# Patient Record
Sex: Female | Born: 1950 | Race: White | Hispanic: No
Health system: Southern US, Community
[De-identification: ages and names within clinical notes are randomized; demographics above are authoritative.]

## PROBLEM LIST (undated history)

## (undated) DIAGNOSIS — N83209 Unspecified ovarian cyst, unspecified side: Secondary | ICD-10-CM

## (undated) DIAGNOSIS — I4891 Unspecified atrial fibrillation: Secondary | ICD-10-CM

## (undated) DIAGNOSIS — F419 Anxiety disorder, unspecified: Secondary | ICD-10-CM

## (undated) DIAGNOSIS — M8580 Other specified disorders of bone density and structure, unspecified site: Secondary | ICD-10-CM

## (undated) DIAGNOSIS — I480 Paroxysmal atrial fibrillation: Secondary | ICD-10-CM

## (undated) DIAGNOSIS — K219 Gastro-esophageal reflux disease without esophagitis: Secondary | ICD-10-CM

## (undated) HISTORY — DX: Paroxysmal atrial fibrillation: I48.0

## (undated) HISTORY — PX: PARTIAL HYSTERECTOMY: SHX80

## (undated) HISTORY — PX: OVARIAN CYST SURGERY: SHX726

## (undated) HISTORY — DX: Unspecified atrial fibrillation: I48.91

---

## 1982-08-08 HISTORY — PX: ABDOMINAL HYSTERECTOMY: SHX81

## 2000-04-25 ENCOUNTER — Other Ambulatory Visit: Admission: RE | Admit: 2000-04-25 | Discharge: 2000-04-25 | Payer: Self-pay | Admitting: Gynecology

## 2003-04-24 ENCOUNTER — Ambulatory Visit (HOSPITAL_COMMUNITY): Admission: RE | Admit: 2003-04-24 | Discharge: 2003-04-24 | Payer: Self-pay | Admitting: Pulmonary Disease

## 2003-05-14 ENCOUNTER — Encounter: Payer: Self-pay | Admitting: Gynecology

## 2003-05-14 ENCOUNTER — Ambulatory Visit (HOSPITAL_COMMUNITY): Admission: RE | Admit: 2003-05-14 | Discharge: 2003-05-14 | Payer: Self-pay | Admitting: Gynecology

## 2003-05-19 ENCOUNTER — Other Ambulatory Visit: Admission: RE | Admit: 2003-05-19 | Discharge: 2003-05-19 | Payer: Self-pay | Admitting: Gynecology

## 2003-05-22 ENCOUNTER — Ambulatory Visit (HOSPITAL_COMMUNITY): Admission: RE | Admit: 2003-05-22 | Discharge: 2003-05-22 | Payer: Self-pay | Admitting: Pulmonary Disease

## 2003-10-13 ENCOUNTER — Ambulatory Visit (HOSPITAL_COMMUNITY): Admission: RE | Admit: 2003-10-13 | Discharge: 2003-10-13 | Payer: Self-pay | Admitting: Gastroenterology

## 2003-11-10 ENCOUNTER — Ambulatory Visit (HOSPITAL_COMMUNITY): Admission: RE | Admit: 2003-11-10 | Discharge: 2003-11-10 | Payer: Self-pay | Admitting: Pulmonary Disease

## 2004-02-20 ENCOUNTER — Ambulatory Visit (HOSPITAL_COMMUNITY): Admission: RE | Admit: 2004-02-20 | Discharge: 2004-02-20 | Payer: Self-pay | Admitting: Pulmonary Disease

## 2004-04-21 ENCOUNTER — Ambulatory Visit (HOSPITAL_COMMUNITY): Admission: RE | Admit: 2004-04-21 | Discharge: 2004-04-21 | Payer: Self-pay | Admitting: Pulmonary Disease

## 2004-05-14 ENCOUNTER — Ambulatory Visit (HOSPITAL_COMMUNITY): Admission: RE | Admit: 2004-05-14 | Discharge: 2004-05-14 | Payer: Self-pay | Admitting: Gynecology

## 2005-03-24 ENCOUNTER — Emergency Department (HOSPITAL_COMMUNITY): Admission: EM | Admit: 2005-03-24 | Discharge: 2005-03-24 | Payer: Self-pay | Admitting: Emergency Medicine

## 2005-05-16 ENCOUNTER — Ambulatory Visit (HOSPITAL_COMMUNITY): Admission: RE | Admit: 2005-05-16 | Discharge: 2005-05-16 | Payer: Self-pay | Admitting: Gynecology

## 2005-05-25 ENCOUNTER — Ambulatory Visit (HOSPITAL_COMMUNITY): Admission: RE | Admit: 2005-05-25 | Discharge: 2005-05-25 | Payer: Self-pay | Admitting: Gynecology

## 2005-12-21 ENCOUNTER — Ambulatory Visit: Payer: Self-pay | Admitting: Orthopedic Surgery

## 2006-02-10 ENCOUNTER — Ambulatory Visit (HOSPITAL_COMMUNITY): Admission: RE | Admit: 2006-02-10 | Discharge: 2006-02-10 | Payer: Self-pay | Admitting: Pulmonary Disease

## 2006-02-12 ENCOUNTER — Emergency Department (HOSPITAL_COMMUNITY): Admission: EM | Admit: 2006-02-12 | Discharge: 2006-02-12 | Payer: Self-pay | Admitting: Emergency Medicine

## 2006-03-21 ENCOUNTER — Emergency Department (HOSPITAL_COMMUNITY): Admission: EM | Admit: 2006-03-21 | Discharge: 2006-03-21 | Payer: Self-pay | Admitting: Emergency Medicine

## 2006-03-26 ENCOUNTER — Emergency Department (HOSPITAL_COMMUNITY): Admission: EM | Admit: 2006-03-26 | Discharge: 2006-03-26 | Payer: Self-pay | Admitting: Emergency Medicine

## 2006-05-12 ENCOUNTER — Encounter (HOSPITAL_COMMUNITY): Admission: RE | Admit: 2006-05-12 | Discharge: 2006-06-11 | Payer: Self-pay | Admitting: Occupational Medicine

## 2006-05-26 ENCOUNTER — Ambulatory Visit (HOSPITAL_COMMUNITY): Admission: RE | Admit: 2006-05-26 | Discharge: 2006-05-26 | Payer: Self-pay | Admitting: Gynecology

## 2006-06-23 ENCOUNTER — Encounter: Admission: RE | Admit: 2006-06-23 | Discharge: 2006-06-23 | Payer: Self-pay | Admitting: Occupational Medicine

## 2006-07-05 ENCOUNTER — Encounter (HOSPITAL_COMMUNITY): Admission: RE | Admit: 2006-07-05 | Discharge: 2006-08-04 | Payer: Self-pay | Admitting: Occupational Medicine

## 2006-08-24 ENCOUNTER — Ambulatory Visit (HOSPITAL_COMMUNITY): Admission: RE | Admit: 2006-08-24 | Discharge: 2006-08-24 | Payer: Self-pay | Admitting: Pulmonary Disease

## 2006-10-17 ENCOUNTER — Ambulatory Visit (HOSPITAL_COMMUNITY)
Admission: RE | Admit: 2006-10-17 | Discharge: 2006-10-17 | Payer: Self-pay | Admitting: Physical Medicine and Rehabilitation

## 2006-10-17 ENCOUNTER — Encounter: Payer: Self-pay | Admitting: Orthopedic Surgery

## 2006-11-07 ENCOUNTER — Ambulatory Visit (HOSPITAL_COMMUNITY): Admission: RE | Admit: 2006-11-07 | Discharge: 2006-11-07 | Payer: Self-pay | Admitting: Gynecology

## 2006-11-08 ENCOUNTER — Ambulatory Visit (HOSPITAL_COMMUNITY): Admission: RE | Admit: 2006-11-08 | Discharge: 2006-11-08 | Payer: Self-pay | Admitting: Gynecology

## 2007-01-31 ENCOUNTER — Ambulatory Visit (HOSPITAL_COMMUNITY)
Admission: RE | Admit: 2007-01-31 | Discharge: 2007-01-31 | Payer: Self-pay | Admitting: Physical Medicine and Rehabilitation

## 2007-03-25 ENCOUNTER — Emergency Department (HOSPITAL_COMMUNITY): Admission: EM | Admit: 2007-03-25 | Discharge: 2007-03-25 | Payer: Self-pay | Admitting: Emergency Medicine

## 2007-04-13 ENCOUNTER — Ambulatory Visit (HOSPITAL_COMMUNITY): Admission: RE | Admit: 2007-04-13 | Discharge: 2007-04-13 | Payer: Self-pay | Admitting: Gastroenterology

## 2007-04-26 ENCOUNTER — Emergency Department (HOSPITAL_COMMUNITY): Admission: EM | Admit: 2007-04-26 | Discharge: 2007-04-26 | Payer: Self-pay | Admitting: Emergency Medicine

## 2007-11-17 ENCOUNTER — Ambulatory Visit: Payer: Self-pay | Admitting: Cardiology

## 2008-03-11 ENCOUNTER — Ambulatory Visit (HOSPITAL_COMMUNITY): Admission: RE | Admit: 2008-03-11 | Discharge: 2008-03-11 | Payer: Self-pay | Admitting: Gynecology

## 2008-03-26 ENCOUNTER — Encounter (HOSPITAL_COMMUNITY)
Admission: RE | Admit: 2008-03-26 | Discharge: 2008-04-15 | Payer: Self-pay | Admitting: Physical Medicine and Rehabilitation

## 2008-12-15 ENCOUNTER — Ambulatory Visit (HOSPITAL_COMMUNITY): Admission: RE | Admit: 2008-12-15 | Discharge: 2008-12-15 | Payer: Self-pay | Admitting: Gastroenterology

## 2009-06-11 ENCOUNTER — Ambulatory Visit (HOSPITAL_COMMUNITY): Admission: RE | Admit: 2009-06-11 | Discharge: 2009-06-11 | Payer: Self-pay | Admitting: Gynecology

## 2009-06-12 ENCOUNTER — Ambulatory Visit (HOSPITAL_COMMUNITY): Admission: RE | Admit: 2009-06-12 | Discharge: 2009-06-12 | Payer: Self-pay | Admitting: Gynecology

## 2009-09-10 ENCOUNTER — Encounter: Payer: Self-pay | Admitting: Orthopedic Surgery

## 2009-09-14 ENCOUNTER — Ambulatory Visit: Payer: Self-pay | Admitting: Orthopedic Surgery

## 2009-09-14 DIAGNOSIS — M25569 Pain in unspecified knee: Secondary | ICD-10-CM | POA: Insufficient documentation

## 2009-09-14 DIAGNOSIS — M1710 Unilateral primary osteoarthritis, unspecified knee: Secondary | ICD-10-CM

## 2010-06-10 ENCOUNTER — Encounter (HOSPITAL_COMMUNITY)
Admission: RE | Admit: 2010-06-10 | Discharge: 2010-07-10 | Payer: Self-pay | Source: Home / Self Care | Admitting: Orthopedic Surgery

## 2010-06-14 ENCOUNTER — Ambulatory Visit (HOSPITAL_COMMUNITY): Admission: RE | Admit: 2010-06-14 | Discharge: 2010-06-14 | Payer: Self-pay | Admitting: Gynecology

## 2010-08-28 ENCOUNTER — Encounter: Payer: Self-pay | Admitting: Gynecology

## 2010-08-29 ENCOUNTER — Encounter: Payer: Self-pay | Admitting: Pulmonary Disease

## 2010-09-07 NOTE — Assessment & Plan Note (Signed)
Summary: rt knee pain needs xr/umr/bsf   Vital Signs:  Patient profile:   60 year old female Pulse rate:   70 / minute Resp:     18 per minute  Vitals Entered By: Fuller Canada MD (September 14, 2009 1:31 PM)  Visit Type:  Initial Consult Referring Provider:  self Primary Provider:  Dr. Juanetta Gosling  CC:  right knee pain.  History of Present Illness: I saw Tara Brennan in the office today for an initial visit.  She is a 60 years old woman with the complaint of:  right knee pain.  The patient reports no injury but reports pain in her RIGHT knee which is sharp stabbing and severe which is intermittent worse when standing located medially and anteriorly does not radiate came on suddenly.  Denies catching or locking.  Meds: Calcium 600 plus D, Aspirin, Generic Fosamax.    Allergies (verified): No Known Drug Allergies  Past History:  Past Medical History: osteoporosis  Past Surgical History: cyst removed from ovary hysterectomy  Family History: FH of Cancer:  Family History of Diabetes Family History of Arthritis  Social History: Patient is married.  short stay registration no smoking, drinking or caffeine use  Review of Systems General:  Denies weight loss, weight gain, fever, chills, and fatigue. Cardiac :  Denies chest pain, angina, heart attack, heart failure, poor circulation, blood clots, and phlebitis. Resp:  Denies short of breath, difficulty breathing, COPD, cough, and pneumonia. GI:  Denies nausea, vomiting, diarrhea, constipation, difficulty swallowing, ulcers, GERD, and reflux. GU:  Denies kidney failure, kidney transplant, kidney stones, burning, poor stream, testicular cancer, blood in urine, and . Neuro:  Denies headache, dizziness, migraines, numbness, weakness, tremor, and unsteady walking. MS:  Denies joint pain, rheumatoid arthritis, joint swelling, gout, bone cancer, osteoporosis, and . Endo:  Denies thyroid disease, goiter, and diabetes. Psych:   Denies depression, mood swings, anxiety, panic attack, bipolar, and schizophrenia. Derm:  Denies eczema, cancer, and itching. EENT:  Denies poor vision, cataracts, glaucoma, poor hearing, vertigo, ears ringing, sinusitis, hoarseness, toothaches, and bleeding gums. Immunology:  Denies seasonal allergies, sinus problems, and allergic to bee stings. Lymphatic:  Denies lymph node cancer and lymph edema.  Physical Exam  Additional Exam:  cancer vital signs are stable as recorded  General appearance was normal  Cardiovascular exam revealed normal pulses and temperature no edema no tenderness moderate amount of varicosities in the tibial areas and dorsal areas of the foot  Lymphatic system groin nodes were negative  Gait and station were normal  On inspection alignment was normal in each knee there was no joint effusion there was no joint tenderness except on the lateral inferior facet of the patella and we noted compression testing revealed crepitance but no pain with range of motion which was full in both knees stability normal both knees muscle strength and tone normal both knees  Hip range of motion normal both hips  Lower extremities RIGHT and LEFT skin normal  Deep tendon reflexes equal 2+  Sensation normal  She was oriented to time person and place  Mood and affect were normal     Impression & Recommendations:  Problem # 1:  KNEE, ARTHRITIS, DEGEN./OSTEO (ICD-715.96) Assessment New  x-rays 3 views RIGHT knee, we see mild to moderate joint space narrowing which is symmetric.  There are no marginal osteophytes.  Bone quality looks normal.  Impression mild osteoarthritis RIGHT knee  Orders: New Patient Level III (14782) Knee x-ray,  3 views (95621)  Problem # 2:  KNEE PAIN (ZOX-096.04) Assessment: New  early arthritis with some patellofemoral symptoms as well.  Recommend exercise program and patient education arthritis from the [employer] if the symptoms become more  constant than repeat evaluation  Orders: New Patient Level III (54098) Knee x-ray,  3 views (11914)  Patient Instructions: 1)  You have arthritis of your knee 2)  We recommend exercises to help with your knee cap arthritis 3)  Give [place] a call if the pain gets worse or more frequent

## 2010-09-07 NOTE — Letter (Signed)
Summary: Historic Patient File  Historic Patient File   Imported By: Elvera Maria 09/29/2009 11:23:00  _____________________________________________________________________  External Attachment:    Type:   Image     Comment:   history form

## 2010-12-21 ENCOUNTER — Ambulatory Visit (HOSPITAL_COMMUNITY)
Admission: RE | Admit: 2010-12-21 | Discharge: 2010-12-21 | Disposition: A | Discharge status: Home / Self Care | Payer: 59 | Source: Ambulatory Visit | Attending: Orthopedic Surgery | Admitting: Orthopedic Surgery

## 2010-12-21 DIAGNOSIS — M6281 Muscle weakness (generalized): Secondary | ICD-10-CM | POA: Insufficient documentation

## 2010-12-21 DIAGNOSIS — M25519 Pain in unspecified shoulder: Secondary | ICD-10-CM | POA: Insufficient documentation

## 2010-12-21 DIAGNOSIS — M25619 Stiffness of unspecified shoulder, not elsewhere classified: Secondary | ICD-10-CM | POA: Insufficient documentation

## 2010-12-21 DIAGNOSIS — Z5189 Encounter for other specified aftercare: Secondary | ICD-10-CM | POA: Insufficient documentation

## 2010-12-21 NOTE — Op Note (Signed)
NAMEEILIS, CHESTNUTT             ACCOUNT NO.:  [id number]   MEDICAL RECORD NO.:  [id number]          PATIENT TYPE:  AMB   LOCATION:  ENDO                         FACILITY[facility]  [facility]   PHYSICIAN:  Petra Kuba, M.D.    DATE OF BIRTH:  February 07, 1951   DATE OF PROCEDURE:  12/15/2008  DATE OF DISCHARGE:                               OPERATIVE REPORT   PROCEDURE:  Colonoscopy.   INDICATIONS:  Patient with family history of colon polyps due for repeat  screening.  Consent was signed after risks, benefits, methods, options  thoroughly discussed multiple times in the past.   MEDICINES USED:  Fentanyl 125 mcg, Versed 8 mg.   PROCEDURE:  Rectal inspection is pertinent for small external  hemorrhoids.  Digital exam was negative.  Video pediatric colonoscope  was inserted.  We easily advanced around the colon to the cecum.  This  did require abdominal pressure but no position changes.  On insertion  some sigmoid diverticula were seen but no other abnormalities.  Cecum  was identified by the appendiceal orifice and ileocecal valve.  In fact,  the scope was inserted short ways in the terminal ileum which was  normal.  Photodocumentation was obtained.  The scope was slowly  withdrawn.  The prep was adequate.  There is some liquid stool that  required washing and suctioning and on slow withdrawal through the  colon, the cecum and ascending were normal.  In the transverse rare  diverticula were seen.  The descending was mostly normal and the sigmoid  did have some small diverticula in it, no polyps, tumors or masses or  other abnormalities were seen as we slowly withdrew back to the rectum.  Once back in the rectum, anorectal pull-through and retroflexion  revealed some hemorrhoids.  Scope was straightened and readvanced short  ways up the left side of the colon.  Air was suctioned, scope removed.  The patient tolerated the procedure well.  There was no obvious  immediate complication.   ENDOSCOPIC  DIAGNOSES:  1. Internal-external tiny hemorrhoids.  2. Rare transverse, primarily sigmoid diverticula, small.  3. Otherwise within normal limits to the terminal ileum.   PLANS:  Happy to see back p.r.n., repeat colon screening in 5 years,  return care to Dr.  Juanetta Gosling for the customary healthcare screening and  maintenance.           ______________________________  Petra Kuba, M.D.     MEM/MEDQ  D:  12/15/2008  T:  12/15/2008  Job:  [id number]   cc:   Ramon Dredge L. Juanetta Gosling, M.D.  Fax: [phone]

## 2010-12-23 ENCOUNTER — Ambulatory Visit (HOSPITAL_COMMUNITY)
Admission: RE | Admit: 2010-12-23 | Discharge: 2010-12-23 | Disposition: A | Discharge status: Home / Self Care | Payer: 59 | Source: Ambulatory Visit | Attending: Pulmonary Disease | Admitting: Pulmonary Disease

## 2010-12-24 NOTE — Op Note (Signed)
NAME:  Tara Brennan, Tara Brennan                       ACCOUNT NO.:  [id number]   MEDICAL RECORD NO.:  [id number]                   PATIENT TYPE:  AMB   LOCATION:  ENDO                                 FACILITY:  [facility]   PHYSICIAN:  Petra Kuba, M.D.                 DATE OF BIRTH:  Sep 29, 1950   DATE OF PROCEDURE:  10/13/2003  DATE OF DISCHARGE:                                 OPERATIVE REPORT   PROCEDURE:  Colonoscopy.   INDICATIONS:  Family history of colon polyps, due for colonic screening.  Left upper quadrant abdominal pain.  Consent was signed after risks,  benefits, methods, and options thoroughly discussed in the office.   MEDICINES USED:  Demerol 80 mg, Versed 8 mg.   DESCRIPTION OF PROCEDURE:  Rectal inspection was pertinent for small  external hemorrhoids.  Digital exam was negative.  The video pediatric  adjustable colonoscope was inserted and despite a tortuous sigmoid, once  through this area was easily able to advance to the cecum.  This did require  rolling her on her back and various abdominal pressures.  No obvious  abnormality was seen on insertion. The cecum was identified by the  appendiceal orifice and the ileocecal valve.  In fact, the scope was  inserted a short way into the terminal ileum, which was normal.  Photo  documentation was obtained.  The scope was slowly withdrawn.  The prep was  adequate.  There was some liquid stool that required washing and suctioning.  On slow withdrawal through the colon, other than a rare early left-sided  diverticulum, no other abnormalities were seen.  Anorectal pull-through and  retroflexion back in the rectum confirmed some small hemorrhoids.  The scope  was straightened, air was suctioned, the scope removed.  The patient  tolerated the procedure well.  There was no obvious immediate complication.   ENDOSCOPIC DIAGNOSES:  1. Internal-external small hemorrhoids.  2. Rare early left-sided diverticula.  3. Otherwise within normal  limits to the terminal ileum.   PLAN:  Yearly rectals and guaiacs by Dr. Greta Doom.  Happy to see back p.r.n. or  in two to three months to recheck symptoms and make sure no further workup  plans are needed.  Otherwise repeat screening in five years.                                               Petra Kuba, M.D.    MEM/MEDQ  D:  10/13/2003  T:  10/13/2003  Job:  [id number]   cc:   Sheppard Plumber. Earlene Plater, M.D.  [address]  [place]  [place] [address]  Fax: [phone]   Luvenia Redden, M.D.  [address]  [place]  [place] [address]  Fax: [phone]

## 2010-12-28 ENCOUNTER — Ambulatory Visit (HOSPITAL_COMMUNITY)
Admission: RE | Admit: 2010-12-28 | Discharge: 2010-12-28 | Disposition: A | Discharge status: Home / Self Care | Payer: 59 | Source: Ambulatory Visit | Attending: Pulmonary Disease | Admitting: Pulmonary Disease

## 2010-12-30 ENCOUNTER — Ambulatory Visit (HOSPITAL_COMMUNITY): Payer: 59 | Admitting: Specialist

## 2011-01-04 ENCOUNTER — Ambulatory Visit (HOSPITAL_COMMUNITY)
Admission: RE | Admit: 2011-01-04 | Discharge: 2011-01-04 | Disposition: A | Discharge status: Home / Self Care | Payer: 59 | Source: Ambulatory Visit | Attending: Pulmonary Disease | Admitting: Pulmonary Disease

## 2011-01-06 ENCOUNTER — Ambulatory Visit (HOSPITAL_COMMUNITY): Payer: 59 | Admitting: Specialist

## 2011-01-11 ENCOUNTER — Ambulatory Visit (HOSPITAL_COMMUNITY): Payer: 59 | Admitting: Specialist

## 2011-01-13 ENCOUNTER — Ambulatory Visit (HOSPITAL_COMMUNITY): Payer: 59 | Admitting: Specialist

## 2011-02-17 ENCOUNTER — Other Ambulatory Visit (HOSPITAL_COMMUNITY): Payer: Self-pay | Admitting: Pulmonary Disease

## 2011-02-17 DIAGNOSIS — R1011 Right upper quadrant pain: Secondary | ICD-10-CM

## 2011-02-18 ENCOUNTER — Ambulatory Visit (HOSPITAL_COMMUNITY)
Admission: RE | Admit: 2011-02-18 | Discharge: 2011-02-18 | Disposition: A | Discharge status: Home / Self Care | Payer: 59 | Source: Ambulatory Visit | Attending: Pulmonary Disease | Admitting: Pulmonary Disease

## 2011-02-18 DIAGNOSIS — K7689 Other specified diseases of liver: Secondary | ICD-10-CM | POA: Insufficient documentation

## 2011-02-18 DIAGNOSIS — R1011 Right upper quadrant pain: Secondary | ICD-10-CM | POA: Insufficient documentation

## 2011-03-02 ENCOUNTER — Ambulatory Visit (HOSPITAL_COMMUNITY)
Admission: RE | Admit: 2011-03-02 | Discharge: 2011-03-02 | Disposition: A | Discharge status: Home / Self Care | Payer: 59 | Source: Ambulatory Visit | Attending: Pulmonary Disease | Admitting: Pulmonary Disease

## 2011-03-02 ENCOUNTER — Other Ambulatory Visit (HOSPITAL_COMMUNITY): Payer: Self-pay | Admitting: Pulmonary Disease

## 2011-03-02 DIAGNOSIS — R0789 Other chest pain: Secondary | ICD-10-CM

## 2011-03-02 DIAGNOSIS — R079 Chest pain, unspecified: Secondary | ICD-10-CM | POA: Insufficient documentation

## 2011-05-04 ENCOUNTER — Other Ambulatory Visit (HOSPITAL_COMMUNITY): Payer: Self-pay | Admitting: Gynecology

## 2011-05-04 DIAGNOSIS — Z139 Encounter for screening, unspecified: Secondary | ICD-10-CM

## 2011-05-06 LAB — BUN: BUN: 20

## 2011-05-20 LAB — POCT RAPID STREP A: Streptococcus, Group A Screen (Direct): NEGATIVE

## 2011-06-17 ENCOUNTER — Ambulatory Visit (HOSPITAL_COMMUNITY)
Admission: RE | Admit: 2011-06-17 | Discharge: 2011-06-17 | Disposition: A | Discharge status: Home / Self Care | Payer: 59 | Source: Ambulatory Visit | Attending: Gynecology | Admitting: Gynecology

## 2011-06-17 DIAGNOSIS — Z1231 Encounter for screening mammogram for malignant neoplasm of breast: Secondary | ICD-10-CM | POA: Insufficient documentation

## 2011-06-17 DIAGNOSIS — Z139 Encounter for screening, unspecified: Secondary | ICD-10-CM

## 2011-06-29 ENCOUNTER — Emergency Department (HOSPITAL_COMMUNITY): Payer: PRIVATE HEALTH INSURANCE

## 2011-06-29 ENCOUNTER — Emergency Department (HOSPITAL_COMMUNITY)
Admission: EM | Admit: 2011-06-29 | Discharge: 2011-06-29 | Disposition: A | Discharge status: Home / Self Care | Payer: PRIVATE HEALTH INSURANCE | Attending: Emergency Medicine | Admitting: Emergency Medicine

## 2011-06-29 ENCOUNTER — Encounter: Payer: Self-pay | Admitting: Emergency Medicine

## 2011-06-29 DIAGNOSIS — W19XXXA Unspecified fall, initial encounter: Secondary | ICD-10-CM

## 2011-06-29 DIAGNOSIS — Z9079 Acquired absence of other genital organ(s): Secondary | ICD-10-CM | POA: Insufficient documentation

## 2011-06-29 DIAGNOSIS — Y9289 Other specified places as the place of occurrence of the external cause: Secondary | ICD-10-CM | POA: Insufficient documentation

## 2011-06-29 DIAGNOSIS — S60229A Contusion of unspecified hand, initial encounter: Secondary | ICD-10-CM | POA: Insufficient documentation

## 2011-06-29 DIAGNOSIS — W010XXA Fall on same level from slipping, tripping and stumbling without subsequent striking against object, initial encounter: Secondary | ICD-10-CM | POA: Insufficient documentation

## 2011-06-29 DIAGNOSIS — S20219A Contusion of unspecified front wall of thorax, initial encounter: Secondary | ICD-10-CM | POA: Insufficient documentation

## 2011-06-29 DIAGNOSIS — M25539 Pain in unspecified wrist: Secondary | ICD-10-CM | POA: Insufficient documentation

## 2011-06-29 DIAGNOSIS — R109 Unspecified abdominal pain: Secondary | ICD-10-CM | POA: Insufficient documentation

## 2011-06-29 DIAGNOSIS — S60221A Contusion of right hand, initial encounter: Secondary | ICD-10-CM

## 2011-06-29 HISTORY — DX: Unspecified ovarian cyst, unspecified side: N83.209

## 2011-06-29 HISTORY — DX: Other specified disorders of bone density and structure, unspecified site: M85.80

## 2011-06-29 NOTE — ED Notes (Signed)
Per patient fell on sidewalk this morning and hit right side on pavement. Patient reports hitting head but denies LOC, dizziness, or blurred vision. Alert and oriented. Patient c/o right side/rib pain, right wrist pain, right knee pain, and "slight headache.

## 2011-06-29 NOTE — ED Provider Notes (Signed)
Scribed for Donnetta Hutching, MD, the patient was seen in room APA01/APA01. This chart was scribed by [employer]. The patient's care started at 09:03  CSN: [id number] Arrival date & time: 06/29/2011  8:43 AM   First MD Initiated Contact with Patient 06/29/11 [phone]      Chief Complaint  Patient presents with  . Fall  . Wrist Pain  . Headache  . Flank Pain  . Knee Pain   HPI Tara Brennan is a 60 y.o. female who presents to the Emergency Department complaining of Fall. She states that she was on her way into work when she tripped on the curb and fell, hitting her head, right wrist and flank. She complains of right wrist, right flank, right knee pain and headache. Patient ambulates with normal gait. She denies any problems chewing.   Past Medical History  Diagnosis Date  . Osteopenia   . Ovarian cyst     Past Surgical History  Procedure Date  . Abdominal hysterectomy 1984  . Partial hysterectomy     Family History  Problem Relation Age of Onset  . Hypertension Mother   . Cancer Father   . Diabetes Sister     History  Substance Use Topics  . Smoking status: Never Smoker   . Smokeless tobacco: Never Used  . Alcohol Use: No    OB History    Grav Para Term Preterm Abortions TAB SAB Ect Mult Living   3 2  2 1  1   2       Review of Systems  Unable to perform ROS Constitutional: Negative for fatigue.  HENT: Negative for congestion, sinus pressure and ear discharge.   Eyes: Negative for discharge.  Respiratory: Negative for cough.   Cardiovascular: Negative for chest pain.  Gastrointestinal: Negative for abdominal pain and diarrhea.  Genitourinary: Positive for flank pain (right flank). Negative for frequency and hematuria.  Musculoskeletal: Negative for back pain.       Right wrist pain  Skin: Negative for rash.  Neurological: Negative for seizures and headaches.  Hematological: Negative.   Psychiatric/Behavioral: Negative for hallucinations.  All other  systems reviewed and are negative.    Allergies  Review of patient's allergies indicates no known allergies.  Home Medications  No current outpatient prescriptions on file.  BP 139/82  Pulse 68  Temp(Src) 98 F (36.7 C) (Oral)  Resp 16  Ht 5\' 8"  (1.727 m)  Wt 170 lb (77.111 kg)  BMI 25.85 kg/m2  SpO2 99%  Physical Exam  Nursing note and vitals reviewed. Constitutional: She is oriented to person, place, and time. She appears well-developed and well-nourished. No distress.  HENT:  Head: Normocephalic and atraumatic.  Right Ear: External ear normal.  Left Ear: External ear normal.  Mouth/Throat: Oropharynx is clear and moist.       Minimal tenderness on the right temporal area  Eyes: Conjunctivae are normal. Right eye exhibits no discharge. Left eye exhibits no discharge. No scleral icterus.  Neck: Neck supple. No tracheal deviation present.  Cardiovascular: Normal rate, regular rhythm and intact distal pulses.   Pulmonary/Chest: Effort normal. No stridor. No respiratory distress. She exhibits tenderness (Minimal tenderness on the right lateral chest wall).  Abdominal: Soft. Bowel sounds are normal. She exhibits no distension. There is no tenderness. There is no rebound and no guarding.  Musculoskeletal: She exhibits tenderness (Tender on the right thenar eminence and some tenderness around the right wrist. Minimally on the right anterior knee). She exhibits no edema.  Neurological: She is alert and oriented to person, place, and time. She has normal strength. No cranial nerve deficit ( no gross defecits noted) or sensory deficit. She exhibits normal muscle tone. She displays no seizure activity. Coordination normal.       No pronator drift bilateral upper extrem, able to hold both legs off bed for 5 seconds, sensation intact in all extremities, no visual field cuts, no left or right sided neglect  Skin: Skin is warm and dry. No rash noted.  Psychiatric: She has a normal mood and  affect. Her behavior is normal.    ED Course  Procedures  DIAGNOSTIC STUDIES: Oxygen Saturation is 99% on room air, normal by my interpretation.    COORDINATION OF CARE: 09:25 - EDP examined patient at bedside. X-ray of right hand and right ribs ordered.   Dg Ribs Unilateral Right  06/29/2011  *RADIOLOGY REPORT*  Clinical Data: Fall.  RIGHT RIBS - 2 VIEW  Comparison: None.  Findings: Marker was placed along the lateral right lower chest. No evidence for a displaced right rib fracture.  Right lung is clear without a large pneumothorax.  IMPRESSION: No evidence for a right rib fracture.  Original Report Authenticated By: Richarda Overlie, M.D.   Dg Hand Complete Right  06/29/2011  *RADIOLOGY REPORT*  Clinical Data: Fall and pain.  RIGHT HAND - COMPLETE 3+ VIEW  Comparison: None.  Findings: Three views of the right hand were obtained.  No evidence for acute fracture or dislocation.   Alignment of the hand is within normal limits.  IMPRESSION: No acute bony abnormality to the right hand.  Original Report Authenticated By: Richarda Overlie, M.D.     MDM.... status post fall.  No neuro deficits.  X-rays negative    Donnetta Hutching, MD 06/29/11 1133

## 2011-06-29 NOTE — ED Notes (Signed)
Pt reports tripped over curb in parking lot.  C/O pain to R hand and R rib area.  Denies SOB.  Radial pulse present, hand warm to touch, capillary refill WNL, and able to move fingers.  Ice pack applied.

## 2011-09-22 ENCOUNTER — Ambulatory Visit (HOSPITAL_COMMUNITY)
Admission: RE | Admit: 2011-09-22 | Discharge: 2011-09-22 | Disposition: A | Discharge status: Home / Self Care | Payer: 59 | Source: Ambulatory Visit | Attending: Pulmonary Disease | Admitting: Pulmonary Disease

## 2011-09-22 ENCOUNTER — Other Ambulatory Visit (HOSPITAL_COMMUNITY): Payer: Self-pay | Admitting: Pulmonary Disease

## 2011-09-22 DIAGNOSIS — R079 Chest pain, unspecified: Secondary | ICD-10-CM | POA: Insufficient documentation

## 2011-12-07 ENCOUNTER — Encounter: Payer: Self-pay | Admitting: Orthopedic Surgery

## 2011-12-07 ENCOUNTER — Ambulatory Visit (INDEPENDENT_AMBULATORY_CARE_PROVIDER_SITE_OTHER): Payer: 59

## 2011-12-07 ENCOUNTER — Ambulatory Visit (INDEPENDENT_AMBULATORY_CARE_PROVIDER_SITE_OTHER): Payer: 59 | Admitting: Orthopedic Surgery

## 2011-12-07 VITALS — BP 126/70 | Ht 68.0 in | Wt 182.0 lb

## 2011-12-07 DIAGNOSIS — M1710 Unilateral primary osteoarthritis, unspecified knee: Secondary | ICD-10-CM

## 2011-12-07 DIAGNOSIS — M25569 Pain in unspecified knee: Secondary | ICD-10-CM

## 2011-12-07 DIAGNOSIS — M179 Osteoarthritis of knee, unspecified: Secondary | ICD-10-CM | POA: Insufficient documentation

## 2011-12-07 NOTE — Progress Notes (Signed)
  Subjective:    Tara Brennan is a 61 y.o. female who presents with knee pain involving both knees. Onset was sudden, related to no specific injury. Mechanism of injury: none. Inciting event: none known. Current symptoms include: crepitus sensation, stiffness and swelling. Pain is aggravated by going up and down stairs and rising after sitting. Patient has had prior knee problems. Evaluation to date: none. Treatment to date: PT which was somewhat effective.  The following portions of the patient's history were reviewed and updated as appropriate: allergies, current medications, past family history, past medical history, past social history, past surgical history and problem list.   Review of Systems A comprehensive review of systems was negative except for: weight gain, border of the eyes.   Objective:    BP 126/70  Ht 5\' 8"  (1.727 m)  Wt 182 lb (82.555 kg)  BMI 27.67 kg/m2  Vital signs are stable as recorded  General appearance is normal  The patient is alert and oriented x3  The patient's mood and affect are normal  Gait assessment: normal ambulation The cardiovascular exam reveals normal pulses and temperature without edema swelling.  The lymphatic system is negative for palpable lymph nodes  The sensory exam is normal.  There are no pathologic reflexes.  Balance is normal.   Exam of the LEFT and RIGHT knee Inspection no effusion is detected in either knee both knees have full range of motion. There is crepitance in both patellofemoral joints. Both knees are stable. Muscle tone is normal in both knees. Skin is normal in both legs. Patellofemoral inhibition is mild in both knees. LEFT worse than RIGHT.  X-ray left knee: mild degenerative changes, symmetric without spurring    Assessment:    Bilateral Mild osteoarthritis bilaterally Mild patellofemoral syndrome bilaterally   Plan:    Natural history and expected course discussed. Questions answered. OTC  analgesics as needed. Arthrocentesis. See procedure note. Follow up in 6 months.   Knee  Injection Procedure Note  Pre-operative Diagnosis: left knee oa  Post-operative Diagnosis: same  Indications: pain  Anesthesia: ethyl chloride   Procedure Details   Verbal consent was obtained for the procedure. Time out was completed.The joint was prepped with alcohol, followed by  Ethyl chloride spray and A 20 gauge needle was inserted into the knee via lateral approach; 4ml 1% lidocaine and 1 ml of depomedrol  was then injected into the joint . The needle was removed and the area cleansed and dressed.  Complications:  None; patient tolerated the procedure well.

## 2011-12-07 NOTE — Patient Instructions (Signed)
You have received a steroid shot. 15% of patients experience increased pain at the injection site with in the next 24 hours. This is best treated with ice and tylenol extra strength 2 tabs every 8 hours. If you are still having pain please call the office.   Start Over the counter OsteoBiflex available at [employer] good weight and exercise

## 2012-01-07 ENCOUNTER — Ambulatory Visit (HOSPITAL_COMMUNITY)
Admission: RE | Admit: 2012-01-07 | Discharge: 2012-01-07 | Disposition: A | Discharge status: Home / Self Care | Payer: 59 | Source: Ambulatory Visit | Attending: Pulmonary Disease | Admitting: Pulmonary Disease

## 2012-01-07 ENCOUNTER — Other Ambulatory Visit (HOSPITAL_COMMUNITY): Payer: Self-pay | Admitting: Pulmonary Disease

## 2012-01-07 DIAGNOSIS — S99919A Unspecified injury of unspecified ankle, initial encounter: Secondary | ICD-10-CM

## 2012-01-07 DIAGNOSIS — S8990XA Unspecified injury of unspecified lower leg, initial encounter: Secondary | ICD-10-CM | POA: Insufficient documentation

## 2012-01-07 DIAGNOSIS — M25579 Pain in unspecified ankle and joints of unspecified foot: Secondary | ICD-10-CM | POA: Insufficient documentation

## 2012-01-07 DIAGNOSIS — X500XXA Overexertion from strenuous movement or load, initial encounter: Secondary | ICD-10-CM | POA: Insufficient documentation

## 2012-04-30 ENCOUNTER — Other Ambulatory Visit (HOSPITAL_BASED_OUTPATIENT_CLINIC_OR_DEPARTMENT_OTHER): Payer: Self-pay | Admitting: Gynecology

## 2012-04-30 ENCOUNTER — Other Ambulatory Visit (HOSPITAL_COMMUNITY): Payer: Self-pay | Admitting: Gynecology

## 2012-04-30 DIAGNOSIS — Z139 Encounter for screening, unspecified: Secondary | ICD-10-CM

## 2012-05-17 ENCOUNTER — Ambulatory Visit (HOSPITAL_COMMUNITY): Payer: Self-pay

## 2012-06-13 ENCOUNTER — Ambulatory Visit (INDEPENDENT_AMBULATORY_CARE_PROVIDER_SITE_OTHER): Payer: 59 | Admitting: Orthopedic Surgery

## 2012-06-13 ENCOUNTER — Encounter: Payer: Self-pay | Admitting: Orthopedic Surgery

## 2012-06-13 VITALS — Ht 68.0 in | Wt 182.0 lb

## 2012-06-13 DIAGNOSIS — M1710 Unilateral primary osteoarthritis, unspecified knee: Secondary | ICD-10-CM

## 2012-06-13 NOTE — Patient Instructions (Addendum)
Activity as tolerated

## 2012-06-13 NOTE — Progress Notes (Signed)
Patient ID: Tara Brennan, female   DOB: 13-Mar-1951, 61 y.o.   MRN: [id number] Chief Complaint  Patient presents with  . Follow-up    6 month recheck on knees.    Status post injection in one of the 2 knees still has a sharp pain in the RIGHT knee. LEFT knee doing well after injection.  Full range of motion, smooth range of motion. No pain no tenderness no swelling.  Pain and osteoarthritis, bilateral knee, LEFT greater than RIGHT.  Follow up as needed

## 2012-06-18 ENCOUNTER — Ambulatory Visit (HOSPITAL_COMMUNITY)
Admission: RE | Admit: 2012-06-18 | Discharge: 2012-06-18 | Disposition: A | Discharge status: Home / Self Care | Payer: 59 | Source: Ambulatory Visit | Attending: Gynecology | Admitting: Gynecology

## 2012-06-18 DIAGNOSIS — Z139 Encounter for screening, unspecified: Secondary | ICD-10-CM

## 2012-06-18 DIAGNOSIS — Z1231 Encounter for screening mammogram for malignant neoplasm of breast: Secondary | ICD-10-CM | POA: Insufficient documentation

## 2012-06-25 ENCOUNTER — Other Ambulatory Visit (HOSPITAL_COMMUNITY): Payer: Self-pay | Admitting: Gynecology

## 2012-06-25 DIAGNOSIS — M810 Age-related osteoporosis without current pathological fracture: Secondary | ICD-10-CM

## 2012-06-28 ENCOUNTER — Encounter (HOSPITAL_COMMUNITY)
Admission: RE | Disposition: A | Discharge status: Home / Self Care | Payer: Self-pay | Source: Ambulatory Visit | Attending: Gastroenterology

## 2012-06-28 ENCOUNTER — Encounter (HOSPITAL_COMMUNITY): Payer: Self-pay

## 2012-06-28 ENCOUNTER — Ambulatory Visit (HOSPITAL_COMMUNITY)
Admission: RE | Admit: 2012-06-28 | Discharge: 2012-06-28 | Disposition: A | Discharge status: Home / Self Care | Payer: 59 | Source: Ambulatory Visit | Attending: Gastroenterology | Admitting: Gastroenterology

## 2012-06-28 DIAGNOSIS — K2100 Gastro-esophageal reflux disease with esophagitis, without bleeding: Secondary | ICD-10-CM | POA: Insufficient documentation

## 2012-06-28 DIAGNOSIS — K449 Diaphragmatic hernia without obstruction or gangrene: Secondary | ICD-10-CM | POA: Insufficient documentation

## 2012-06-28 HISTORY — DX: Gastro-esophageal reflux disease without esophagitis: K21.9

## 2012-06-28 HISTORY — PX: ESOPHAGOGASTRODUODENOSCOPY: SHX5428

## 2012-06-28 SURGERY — EGD (ESOPHAGOGASTRODUODENOSCOPY)
Anesthesia: Moderate Sedation

## 2012-06-28 MED ORDER — SODIUM CHLORIDE 0.9 % IV SOLN: INTRAVENOUS | Status: DC

## 2012-06-28 MED ORDER — FENTANYL CITRATE 0.05 MG/ML IJ SOLN
INTRAMUSCULAR | Status: AC
  Filled 2012-06-28: qty 2

## 2012-06-28 MED ORDER — MIDAZOLAM HCL 10 MG/2ML IJ SOLN
INTRAMUSCULAR | Status: DC | PRN
  Administered 2012-06-28 (×3): 2 mg via INTRAVENOUS
  Administered 2012-06-28: 1 mg via INTRAVENOUS
  Administered 2012-06-28: 2 mg via INTRAVENOUS

## 2012-06-28 MED ORDER — BUTAMBEN-TETRACAINE-BENZOCAINE 2-2-14 % EX AERO
INHALATION_SPRAY | CUTANEOUS | Status: DC | PRN
  Administered 2012-06-28: 2 via TOPICAL

## 2012-06-28 MED ORDER — DIPHENHYDRAMINE HCL 50 MG/ML IJ SOLN
INTRAMUSCULAR | Status: DC | PRN
  Administered 2012-06-28: 12.5 mg via INTRAVENOUS

## 2012-06-28 MED ORDER — FENTANYL CITRATE 0.05 MG/ML IJ SOLN
INTRAMUSCULAR | Status: DC | PRN
  Administered 2012-06-28 (×3): 25 ug via INTRAVENOUS

## 2012-06-28 MED ORDER — DIPHENHYDRAMINE HCL 50 MG/ML IJ SOLN
INTRAMUSCULAR | Status: AC
  Filled 2012-06-28: qty 1

## 2012-06-28 MED ORDER — MIDAZOLAM HCL 5 MG/ML IJ SOLN
INTRAMUSCULAR | Status: AC
  Filled 2012-06-28: qty 2

## 2012-06-28 NOTE — Op Note (Signed)
[facility] [address] [place], [address]   ENDOSCOPY PROCEDURE REPORT  PATIENT: [name], [name]  MR#: [id number] BIRTHDATE: 1950-08-11 , 61  yrs. old GENDER: Female  ENDOSCOPIST: Vida Rigger, MD REFERRED XB:JYNWGN Juanetta Gosling, M.D.  PROCEDURE DATE:  06/28/2012 PROCEDURE:   EGD, diagnostic ASA CLASS:   Class II INDICATIONS:Chest pain and Therapy of reflux esophagitis.  MEDICATIONS: Benadryl 12.5 mg IV, Fentanyl 75 mcg IV, and Versed 9 mg IV  TOPICAL ANESTHETIC:  DESCRIPTION OF PROCEDURE:   After the risks benefits and alternatives of the procedure were thoroughly explained, informed consent was obtained.  The Pentax Gastroscope [id number]  endoscope was introduced through the mouth and advanced to the third or even fourth portion of the duodenum , limited by Without limitations. The instrument was slowly withdrawn as the mucosa was fully examined.other than a moderate sized hiatal hernia no other abnormalities were seen and the findings are recorded below and the patient tolerated the procedure well and there was no obvious immediate complication         FINDINGS: 1. Moderate hiatal hernia 2. Otherwise within normal limits EGD to probably the fourth part of the duodenum  COMPLICATIONS:none  ENDOSCOPIC IMPRESSION: above   RECOMMENDATIONS:continue pump inhibitor call me when necessary followup in 2-3 months even consider surgical options  in the futureREPEAT EXAM:as needed   _______________________________ Vida Rigger, MD eSigned:  Vida Rigger, MD 06/28/2012 9:21 AM    CC:Ed Juanetta Gosling, MD  PATIENT NAME:  Tara Brennan, Tara Brennan MR#: [id number]

## 2012-06-28 NOTE — Progress Notes (Signed)
Tara Brennan 8:34 AM  Subjective: Patient is improved since reseller in the office on the medicine with no new complaints or problem although she still has some discomfort after eating she does not have any dysphagia  Objective: Vital signs stable afebrile no acute distress please see preassessment evaluation  Assessment: Upper tract symptoms  Plan: Okay to proceed with endoscopy doubt she will need dilation but we discussed that with her and her husband as well  Kylea Berrong E

## 2012-06-29 ENCOUNTER — Encounter (HOSPITAL_COMMUNITY): Payer: Self-pay | Admitting: Gastroenterology

## 2012-06-29 ENCOUNTER — Encounter (HOSPITAL_COMMUNITY): Payer: Self-pay

## 2012-07-10 ENCOUNTER — Ambulatory Visit (HOSPITAL_COMMUNITY)
Admission: RE | Admit: 2012-07-10 | Discharge: 2012-07-10 | Disposition: A | Discharge status: Home / Self Care | Payer: 59 | Source: Ambulatory Visit | Attending: Gynecology | Admitting: Gynecology

## 2012-07-10 DIAGNOSIS — M810 Age-related osteoporosis without current pathological fracture: Secondary | ICD-10-CM | POA: Insufficient documentation

## 2013-04-04 ENCOUNTER — Ambulatory Visit (INDEPENDENT_AMBULATORY_CARE_PROVIDER_SITE_OTHER): Payer: 59 | Admitting: Orthopedic Surgery

## 2013-04-04 ENCOUNTER — Encounter: Payer: Self-pay | Admitting: Orthopedic Surgery

## 2013-04-04 ENCOUNTER — Ambulatory Visit (INDEPENDENT_AMBULATORY_CARE_PROVIDER_SITE_OTHER): Payer: 59

## 2013-04-04 DIAGNOSIS — M79609 Pain in unspecified limb: Secondary | ICD-10-CM

## 2013-04-04 DIAGNOSIS — M79676 Pain in unspecified toe(s): Secondary | ICD-10-CM | POA: Insufficient documentation

## 2013-04-04 NOTE — Progress Notes (Signed)
Patient ID: Tara Brennan, female   DOB: [birth year], 62 y.o.   MRN: [id number]  Chief Complaint  Patient presents with  . Toe Pain    2nd toe right foot injured 03/22/13    HISTORY: This is a 61 year old female who stubbed her right foot and injured the right second toe with change in alignment. This is about August 15 she's take the toe because of the deformity she complains of intermittent throbbing 3/10 pain swelling at the PIP joint and she had some ecchymosis at the time review of systems negative she has had a hysterectomy no medical problems she is married   BP 148/87  Ht 5' 7.5" (1.715 m)  Wt 173 lb (78.472 kg)  BMI 26.68 kg/m2 General appearance is normal, the patient is alert and oriented x3 with normal mood and affect. When she stands the toe has separation between the great toe and second toe with angulation towards the fibula and small toe tenderness at the PIP joint with swelling painful range of motion at the PIP and DIP joint compared to the other foot there is slight angulation and scans intact has good capillary refill and normal dorsalis pedis pulse we did order an x-ray the x-ray does not show definitive fracture there is some abnormality at the PIP joint near the middle phalanx but no definitive fracture  Impression toe injury cannot rule out fracture recommend buddy taping followup as needed

## 2013-04-04 NOTE — Patient Instructions (Signed)
Tape 1 and 2

## 2013-06-03 ENCOUNTER — Other Ambulatory Visit (HOSPITAL_COMMUNITY): Payer: Self-pay | Admitting: Gynecology

## 2013-06-03 DIAGNOSIS — Z139 Encounter for screening, unspecified: Secondary | ICD-10-CM

## 2013-06-21 ENCOUNTER — Ambulatory Visit (HOSPITAL_COMMUNITY)
Admission: RE | Admit: 2013-06-21 | Discharge: 2013-06-21 | Disposition: A | Discharge status: Home / Self Care | Payer: 59 | Source: Ambulatory Visit | Attending: Gynecology | Admitting: Gynecology

## 2013-06-21 DIAGNOSIS — Z139 Encounter for screening, unspecified: Secondary | ICD-10-CM

## 2013-06-21 DIAGNOSIS — Z1231 Encounter for screening mammogram for malignant neoplasm of breast: Secondary | ICD-10-CM | POA: Insufficient documentation

## 2014-06-09 ENCOUNTER — Encounter: Payer: Self-pay | Admitting: Orthopedic Surgery

## 2014-06-17 ENCOUNTER — Other Ambulatory Visit (HOSPITAL_COMMUNITY): Payer: Self-pay | Admitting: Gynecology

## 2014-06-17 DIAGNOSIS — Z1231 Encounter for screening mammogram for malignant neoplasm of breast: Secondary | ICD-10-CM

## 2014-06-27 ENCOUNTER — Ambulatory Visit (HOSPITAL_COMMUNITY)
Admission: RE | Admit: 2014-06-27 | Discharge: 2014-06-27 | Disposition: A | Discharge status: Home / Self Care | Payer: BC Managed Care – PPO | Source: Ambulatory Visit | Attending: Gynecology | Admitting: Gynecology

## 2014-06-27 DIAGNOSIS — Z1231 Encounter for screening mammogram for malignant neoplasm of breast: Secondary | ICD-10-CM | POA: Insufficient documentation

## 2014-10-09 ENCOUNTER — Ambulatory Visit (HOSPITAL_COMMUNITY)
Admission: RE | Admit: 2014-10-09 | Discharge: 2014-10-09 | Disposition: A | Discharge status: Home / Self Care | Payer: 59 | Source: Ambulatory Visit | Attending: Pulmonary Disease | Admitting: Pulmonary Disease

## 2014-10-09 ENCOUNTER — Other Ambulatory Visit (HOSPITAL_COMMUNITY): Payer: Self-pay | Admitting: Pulmonary Disease

## 2014-10-09 DIAGNOSIS — M542 Cervicalgia: Secondary | ICD-10-CM | POA: Diagnosis present

## 2014-10-09 DIAGNOSIS — M5032 Other cervical disc degeneration, mid-cervical region: Secondary | ICD-10-CM | POA: Diagnosis not present

## 2015-01-09 ENCOUNTER — Other Ambulatory Visit (HOSPITAL_COMMUNITY): Payer: Self-pay | Admitting: Pulmonary Disease

## 2015-01-09 ENCOUNTER — Ambulatory Visit (HOSPITAL_COMMUNITY)
Admission: RE | Admit: 2015-01-09 | Discharge: 2015-01-09 | Disposition: A | Discharge status: Home / Self Care | Payer: 59 | Source: Ambulatory Visit | Attending: Pulmonary Disease | Admitting: Pulmonary Disease

## 2015-01-09 DIAGNOSIS — R933 Abnormal findings on diagnostic imaging of other parts of digestive tract: Secondary | ICD-10-CM | POA: Diagnosis not present

## 2015-01-09 DIAGNOSIS — R1030 Lower abdominal pain, unspecified: Secondary | ICD-10-CM | POA: Insufficient documentation

## 2015-01-09 DIAGNOSIS — R110 Nausea: Secondary | ICD-10-CM | POA: Insufficient documentation

## 2015-01-09 LAB — POCT I-STAT CREATININE: CREATININE: 0.8 mg/dL (ref 0.44–1.00)

## 2015-01-09 MED ORDER — IOHEXOL 300 MG/ML  SOLN
100.0000 mL | Freq: Once | INTRAMUSCULAR | Status: AC | PRN
  Administered 2015-01-09: 100 mL via INTRAVENOUS

## 2015-03-16 ENCOUNTER — Ambulatory Visit (HOSPITAL_COMMUNITY)
Admission: RE | Admit: 2015-03-16 | Discharge: 2015-03-16 | Disposition: A | Discharge status: Home / Self Care | Payer: 59 | Source: Ambulatory Visit | Attending: Pulmonary Disease | Admitting: Pulmonary Disease

## 2015-03-16 ENCOUNTER — Other Ambulatory Visit (HOSPITAL_COMMUNITY): Payer: Self-pay | Admitting: Pulmonary Disease

## 2015-03-16 DIAGNOSIS — R0602 Shortness of breath: Secondary | ICD-10-CM | POA: Diagnosis not present

## 2015-03-16 DIAGNOSIS — R918 Other nonspecific abnormal finding of lung field: Secondary | ICD-10-CM | POA: Insufficient documentation

## 2015-06-30 ENCOUNTER — Other Ambulatory Visit (HOSPITAL_COMMUNITY): Payer: Self-pay | Admitting: Pulmonary Disease

## 2015-06-30 DIAGNOSIS — Z1231 Encounter for screening mammogram for malignant neoplasm of breast: Secondary | ICD-10-CM

## 2015-07-08 ENCOUNTER — Other Ambulatory Visit (HOSPITAL_COMMUNITY): Payer: Self-pay | Admitting: Pulmonary Disease

## 2015-07-08 ENCOUNTER — Ambulatory Visit (HOSPITAL_COMMUNITY)
Admission: RE | Admit: 2015-07-08 | Discharge: 2015-07-08 | Disposition: A | Discharge status: Home / Self Care | Payer: 59 | Source: Ambulatory Visit | Attending: Pulmonary Disease | Admitting: Pulmonary Disease

## 2015-07-08 DIAGNOSIS — Z1231 Encounter for screening mammogram for malignant neoplasm of breast: Secondary | ICD-10-CM | POA: Insufficient documentation

## 2015-07-08 DIAGNOSIS — Z79899 Other long term (current) drug therapy: Secondary | ICD-10-CM

## 2015-07-08 DIAGNOSIS — Z780 Asymptomatic menopausal state: Secondary | ICD-10-CM

## 2015-07-14 ENCOUNTER — Other Ambulatory Visit (HOSPITAL_COMMUNITY): Payer: Self-pay

## 2015-07-20 ENCOUNTER — Other Ambulatory Visit (HOSPITAL_COMMUNITY): Payer: Self-pay

## 2015-07-24 ENCOUNTER — Ambulatory Visit (HOSPITAL_COMMUNITY)
Admission: RE | Admit: 2015-07-24 | Discharge: 2015-07-24 | Disposition: A | Discharge status: Home / Self Care | Payer: 59 | Source: Ambulatory Visit | Attending: Pulmonary Disease | Admitting: Pulmonary Disease

## 2015-07-24 DIAGNOSIS — Z780 Asymptomatic menopausal state: Secondary | ICD-10-CM | POA: Insufficient documentation

## 2015-07-24 DIAGNOSIS — M810 Age-related osteoporosis without current pathological fracture: Secondary | ICD-10-CM | POA: Diagnosis not present

## 2015-07-24 DIAGNOSIS — Z90721 Acquired absence of ovaries, unilateral: Secondary | ICD-10-CM | POA: Insufficient documentation

## 2015-07-24 DIAGNOSIS — Z79899 Other long term (current) drug therapy: Secondary | ICD-10-CM | POA: Insufficient documentation

## 2015-07-24 DIAGNOSIS — Z90710 Acquired absence of both cervix and uterus: Secondary | ICD-10-CM | POA: Diagnosis not present

## 2015-10-05 DIAGNOSIS — M810 Age-related osteoporosis without current pathological fracture: Secondary | ICD-10-CM | POA: Insufficient documentation

## 2015-12-30 ENCOUNTER — Ambulatory Visit (HOSPITAL_COMMUNITY)
Admission: RE | Admit: 2015-12-30 | Discharge: 2015-12-30 | Disposition: A | Discharge status: Home / Self Care | Payer: BLUE CROSS/BLUE SHIELD | Source: Ambulatory Visit | Attending: Pulmonary Disease | Admitting: Pulmonary Disease

## 2015-12-30 ENCOUNTER — Other Ambulatory Visit (HOSPITAL_COMMUNITY): Payer: Self-pay | Admitting: Pulmonary Disease

## 2015-12-30 DIAGNOSIS — M25551 Pain in right hip: Secondary | ICD-10-CM

## 2016-07-08 ENCOUNTER — Other Ambulatory Visit (HOSPITAL_COMMUNITY): Payer: Self-pay | Admitting: Obstetrics and Gynecology

## 2016-07-08 DIAGNOSIS — Z1231 Encounter for screening mammogram for malignant neoplasm of breast: Secondary | ICD-10-CM

## 2016-07-14 ENCOUNTER — Ambulatory Visit (HOSPITAL_COMMUNITY)
Admission: RE | Admit: 2016-07-14 | Discharge: 2016-07-14 | Disposition: A | Discharge status: Home / Self Care | Payer: BLUE CROSS/BLUE SHIELD | Source: Ambulatory Visit | Attending: Obstetrics and Gynecology | Admitting: Obstetrics and Gynecology

## 2016-07-14 DIAGNOSIS — Z1231 Encounter for screening mammogram for malignant neoplasm of breast: Secondary | ICD-10-CM | POA: Diagnosis not present

## 2016-08-04 ENCOUNTER — Ambulatory Visit (HOSPITAL_COMMUNITY)
Admission: RE | Admit: 2016-08-04 | Discharge: 2016-08-04 | Disposition: A | Discharge status: Home / Self Care | Payer: BLUE CROSS/BLUE SHIELD | Source: Ambulatory Visit | Attending: Pulmonary Disease | Admitting: Pulmonary Disease

## 2016-08-04 ENCOUNTER — Other Ambulatory Visit (HOSPITAL_COMMUNITY): Payer: Self-pay | Admitting: Pulmonary Disease

## 2016-08-04 DIAGNOSIS — Z9181 History of falling: Secondary | ICD-10-CM | POA: Diagnosis not present

## 2016-08-04 DIAGNOSIS — W19XXXD Unspecified fall, subsequent encounter: Secondary | ICD-10-CM

## 2017-02-24 DIAGNOSIS — K219 Gastro-esophageal reflux disease without esophagitis: Secondary | ICD-10-CM | POA: Diagnosis not present

## 2017-02-24 DIAGNOSIS — M1990 Unspecified osteoarthritis, unspecified site: Secondary | ICD-10-CM | POA: Diagnosis not present

## 2017-02-24 DIAGNOSIS — M818 Other osteoporosis without current pathological fracture: Secondary | ICD-10-CM | POA: Diagnosis not present

## 2017-02-24 DIAGNOSIS — I10 Essential (primary) hypertension: Secondary | ICD-10-CM | POA: Diagnosis not present

## 2017-02-27 DIAGNOSIS — H2513 Age-related nuclear cataract, bilateral: Secondary | ICD-10-CM | POA: Diagnosis not present

## 2017-05-10 DIAGNOSIS — M7542 Impingement syndrome of left shoulder: Secondary | ICD-10-CM | POA: Diagnosis not present

## 2017-07-19 ENCOUNTER — Other Ambulatory Visit (HOSPITAL_COMMUNITY): Payer: Self-pay | Admitting: Obstetrics & Gynecology

## 2017-07-19 DIAGNOSIS — Z1231 Encounter for screening mammogram for malignant neoplasm of breast: Secondary | ICD-10-CM

## 2017-07-20 ENCOUNTER — Ambulatory Visit (HOSPITAL_COMMUNITY): Payer: Self-pay

## 2017-07-24 ENCOUNTER — Ambulatory Visit (HOSPITAL_COMMUNITY)
Admission: RE | Admit: 2017-07-24 | Discharge: 2017-07-24 | Disposition: A | Discharge status: Home / Self Care | Payer: PPO | Source: Ambulatory Visit | Attending: Obstetrics & Gynecology | Admitting: Obstetrics & Gynecology

## 2017-07-24 ENCOUNTER — Encounter (HOSPITAL_COMMUNITY): Payer: Self-pay

## 2017-07-24 DIAGNOSIS — Z1231 Encounter for screening mammogram for malignant neoplasm of breast: Secondary | ICD-10-CM | POA: Diagnosis not present

## 2017-07-25 DIAGNOSIS — Z01419 Encounter for gynecological examination (general) (routine) without abnormal findings: Secondary | ICD-10-CM | POA: Diagnosis not present

## 2017-07-25 DIAGNOSIS — Z124 Encounter for screening for malignant neoplasm of cervix: Secondary | ICD-10-CM | POA: Diagnosis not present

## 2017-07-25 DIAGNOSIS — Z6827 Body mass index (BMI) 27.0-27.9, adult: Secondary | ICD-10-CM | POA: Diagnosis not present

## 2017-08-17 DIAGNOSIS — I10 Essential (primary) hypertension: Secondary | ICD-10-CM | POA: Diagnosis not present

## 2017-08-17 DIAGNOSIS — H00019 Hordeolum externum unspecified eye, unspecified eyelid: Secondary | ICD-10-CM | POA: Diagnosis not present

## 2017-08-17 DIAGNOSIS — K219 Gastro-esophageal reflux disease without esophagitis: Secondary | ICD-10-CM | POA: Diagnosis not present

## 2017-08-28 DIAGNOSIS — M1990 Unspecified osteoarthritis, unspecified site: Secondary | ICD-10-CM | POA: Diagnosis not present

## 2017-08-28 DIAGNOSIS — I10 Essential (primary) hypertension: Secondary | ICD-10-CM | POA: Diagnosis not present

## 2017-08-28 DIAGNOSIS — K210 Gastro-esophageal reflux disease with esophagitis: Secondary | ICD-10-CM | POA: Diagnosis not present

## 2017-09-01 ENCOUNTER — Ambulatory Visit (INDEPENDENT_AMBULATORY_CARE_PROVIDER_SITE_OTHER): Payer: 59 | Admitting: Orthopedic Surgery

## 2017-09-01 ENCOUNTER — Ambulatory Visit (INDEPENDENT_AMBULATORY_CARE_PROVIDER_SITE_OTHER): Payer: 59

## 2017-09-01 ENCOUNTER — Encounter (INDEPENDENT_AMBULATORY_CARE_PROVIDER_SITE_OTHER): Payer: Self-pay | Admitting: Orthopedic Surgery

## 2017-09-01 DIAGNOSIS — M5410 Radiculopathy, site unspecified: Secondary | ICD-10-CM

## 2017-09-01 DIAGNOSIS — M4856XA Collapsed vertebra, not elsewhere classified, lumbar region, initial encounter for fracture: Secondary | ICD-10-CM | POA: Diagnosis not present

## 2017-09-01 NOTE — Progress Notes (Signed)
Office Visit Note   Patient: Tara Brennan           Date of Birth: 1950/08/22           MRN: [id number] Visit Date: 09/01/2017 Requested by: Sinda Du, [place], [place] [address] PCP: Sinda Du, MD  Subjective: Chief Complaint  Patient presents with  . Left Leg - Pain    HPI: Tara Brennan is a patient with left knee and leg pain.  States that the pain comes and goes.  She reports a pulling on the lateral side of her leg especially going up stairs.  This will wake her from sleep at night.  He has occasional back pain.  She does report one episode of low back pain year ago which was severe but only lasted one day when she was picking up her grandchild.  Denies any bowel or bladder symptoms or other systemic symptoms of illness.              ROS: All systems reviewed are negative as they relate to the chief complaint within the history of present illness.  Patient denies  fevers or chills.   Assessment & Plan: Visit Diagnoses:  1. Radicular leg pain   2. Non-traumatic compression fracture of first lumbar vertebra, initial encounter [facility])     Plan: Impression is left knee and leg pain with new L1 compression fracture.  She does not give a great history of having what I would expect to be 3 or 4 weeks of severe pain associated with this level of a compression fracture.  Concerned this may represent a pathologic fracture.  No definite history of severe osteoporosis.  Plan at this time is MRI L-spine to evaluate this L1 compression fracture.  Her left knee and leg symptoms may be related to iliotibial band bursitis.  We will try her with over-the-counter nonsteroidals daily for [redacted] weeks along with physical therapy for ITB stretching.  The her back after her scan.  Follow-Up Instructions: Return for after MRI.   Orders:  Orders Placed This Encounter  Procedures  . XR Lumbar Spine 2-3 Views  . XR KNEE 3 VIEW LEFT  . MR Lumbar Spine w/o contrast    No orders of the defined types were placed in this encounter.     Procedures: No procedures performed   Clinical Data: No additional findings.  Objective: Vital Signs: There were no vitals taken for this visit.  Physical Exam:   Constitutional: Patient appears well-developed HEENT:  Head: Normocephalic Eyes:EOM are normal Neck: Normal range of motion Cardiovascular: Normal rate Pulmonary/chest: Effort normal Neurologic: Patient is alert Skin: Skin is warm Psychiatric: Patient has normal mood and affect    Ortho Exam: Orthopedic exam demonstrates full active and passive range of motion of the left and right knee.  No effusion on either knee.  Slight iliotibial band tenderness over the lateral epicondyle on the left compared to the right.  Collateral and cruciate ligaments are stable in the left knee.  No groin pain with internal/external rotation of the leg.  No definite paresthesias L1 S1 bilaterally with symmetric reflexes in the patella and biceps and Achilles as well as no muscle atrophy.  She has good ankle dorsiflexion plantar flexion quad and hamstring strength.  Specialty Comments:  No specialty comments available.  Imaging: Xr Knee 3 View Left  Result Date: 09/01/2017 [employer].  No fracture or dislocation is present.  Joint space is maintained in the medial  lateral and patellofemoral compartments.  Alignment normal.  Normal left knee  Xr Lumbar Spine 2-3 Views  Result Date: 09/01/2017 AP lateral lumbar spine reviewed.  Patient has compression fracture over 50% collapse of the L1 vertebral body.  Focal kyphosis present at that level.  Degenerative disc disease but arthritis is present.  There is no scoliosis present.    PMFS History: Patient Active Problem List   Diagnosis Date Noted  . Pain in toe 04/04/2013  . 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 OA (osteoarthritis) of knee 12/07/2011  . Knee pain 12/07/2011  . KNEE, ARTHRITIS, DEGEN./OSTEO  09/14/2009  . KNEE PAIN 09/14/2009   Past Medical History:  Diagnosis Date  . GERD (gastroesophageal reflux disease)   . Osteopenia   . Ovarian cyst     Family History  Problem Relation Age of Onset  . Hypertension Mother   . Cancer Father   . Diabetes Sister   . Arthritis Unknown     Past Surgical History:  Procedure Laterality Date  . ABDOMINAL HYSTERECTOMY  1984  . ESOPHAGOGASTRODUODENOSCOPY  06/28/2012   Procedure: ESOPHAGOGASTRODUODENOSCOPY (EGD);  Surgeon: Jeryl Columbia, MD;  Location: [facility] ENDOSCOPY;  Service: Endoscopy;  Laterality: N/A;  . OVARIAN CYST SURGERY    . PARTIAL HYSTERECTOMY     Social History   Occupational History  . Not on file  Tobacco Use  . Smoking status: Never Smoker  . Smokeless tobacco: Never Used  Substance and Sexual Activity  . Alcohol use: No  . Drug use: No  . Sexual activity: Yes    Birth control/protection: Surgical

## 2017-09-04 ENCOUNTER — Ambulatory Visit (HOSPITAL_COMMUNITY): Payer: PPO | Attending: Orthopedic Surgery

## 2017-09-04 DIAGNOSIS — M25652 Stiffness of left hip, not elsewhere classified: Secondary | ICD-10-CM

## 2017-09-04 DIAGNOSIS — M25552 Pain in left hip: Secondary | ICD-10-CM | POA: Diagnosis not present

## 2017-09-04 DIAGNOSIS — M62838 Other muscle spasm: Secondary | ICD-10-CM | POA: Diagnosis not present

## 2017-09-04 NOTE — Therapy (Addendum)
PHYSICAL THERAPY DISCHARGE SUMMARY  Visits from Start of Care: 1  Current functional level related to goals / functional outcomes: Evaluation completed only, no FU visits. Ortho asked PT to DC from PT d/t new imaging findings.    Remaining deficits: *see above    Education / Equipment: None, None.   Plan: Patient agrees to discharge.  Patient goals were not met. Patient is being discharged due to the physician's request.  ?????         3:29 PM, 10/02/17 Etta Grandchild, PT, DPT Physical Therapist - [place] [phone] (Office)      ---------------------------------------------------------------------------------------------------------------------------------------------------------------------   [place], [place], [address] Phone: [phone]   Fax:  [phone]  Physical Therapy Evaluation  Patient Details  Name: Tara Brennan MRN: [id number] Date of Birth: 27-Aug-1950 Referring Provider: Meredith Pel, MD    Encounter Date: 09/04/2017  PT End of Session - 09/04/17 1440    Visit Number  1    Number of Visits  12    Date for PT Re-Evaluation  09/25/17    Authorization Time Period  09/04/17-10/16/17    PT Start Time  1300    PT Stop Time  1350    PT Time Calculation (min)  50 min    Activity Tolerance  Patient tolerated treatment well       Past Medical History:  Diagnosis Date  . GERD (gastroesophageal reflux disease)   . Osteopenia   . Ovarian cyst     Past Surgical History:  Procedure Laterality Date  . ABDOMINAL HYSTERECTOMY  1984  . ESOPHAGOGASTRODUODENOSCOPY  06/28/2012   Procedure: ESOPHAGOGASTRODUODENOSCOPY (EGD);  Surgeon: Jeryl Columbia, MD;  Location: [facility] ENDOSCOPY;  Service: Endoscopy;  Laterality: N/A;  . OVARIAN CYST SURGERY    . PARTIAL HYSTERECTOMY      There were no vitals filed for this visit.   Subjective Assessment - 09/04/17  1313    Subjective  Pt presents to PT fo revaluation of Left hip pain. She has had PT for her back pain several years ago. This is her first issue with the left hip.     Pertinent History  Pt reports about threea months ago or more noticing some pain in her left trochanteric region with referral into the left lateral thigh, never below the knee, almost exclusiviely associated with carrying her grandchildren while ascending a4-5 steps or more with a step-over-step pattern. The patient reports this occurs when carrying the children at front, in center, or occasionally on right hip. She can feel it mildly without the grandkids but to a much lesser degree. She also notes some stiffness in both legs after sitting x30 minutes or more.     How long can you sit comfortably?  not limited, but over 30 minutes, demonstrates an increased stiffness that limits AMB quality for a few seconds.     How long can you stand comfortably?  unlimited.     How long can you walk comfortably?  unlimited.     Diagnostic tests  recent xrays fo knees and back MD reports according to patient "knees look good," but that she has a compression fracture in the her back of indeterminate chronicity.      Patient Stated Goals  improve her ability toperform care for kids     Currently in Pain?  No/denies paresthesia in theleft lateral pelvis and hiip posterio hip.  [facility] PT Assessment - 09/04/17 0001      Assessment   Medical Diagnosis  Left hip pain with referral into left hip     Referring Provider  Meredith Pel, MD     Onset Date/Surgical Date  -- ~ 3 months ago     Hand Dominance  Right    Next MD Visit  -- After her MRI on 09/29/17    Prior Therapy  none      Precautions   Precautions  None      Restrictions   Weight Bearing Restrictions  No      Balance Screen   Has the patient fallen in the past 6 months  Yes    How many times?  1 tripped over a rake    Has the patient had a decrease in activity level  because of a fear of falling?   No    Is the patient reluctant to leave their home because of a fear of falling?   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; volunteers at [employer]       Observation/Other [employer] on Therapeutic Outcomes (FOTO)   74 (26% impaired)       Sensation   Light Touch  Appears Intact      Functional Tests   Functional tests  Single leg stance;Floor to Stand      Single Leg Stance   Comments  assess at subsequent visit      Floor to Stand   Comments  assess at subsequent visit      ROM / Strength   AROM / PROM / Strength  Strength;PROM      PROM   PROM Assessment Site  Hip    Right/Left Hip  Left;Right    Right Hip External Rotation   54    Right Hip Internal Rotation   27    Left Hip External Rotation   49    Left Hip Internal Rotation   29      Strength   Strength Assessment Site  Hip;Knee;Ankle    Right/Left Hip  Right;Left    Right Hip Flexion  4+/5 seated, good ROM    Right Hip Extension  5/5 (straight leg, ROM WNL)    Right Hip External Rotation   5/5    Right Hip Internal Rotation  5/5    Right Hip ABduction  4+/5 hoizontal abdct: 5/5    Right Hip ADduction  5/5    Left Hip Flexion  5/5 seated, good ROM    Left Hip Extension  4-/5 (straight leg, ROM WNL)    Left Hip External Rotation  4-/5    Left Hip Internal Rotation  4+/5 reproduces trochanteric pain    Left Hip ABduction  4-/5 hoizontal abdct: 5/5    Left Hip ADduction  5/5    Right/Left Knee  Right;Left    Right Knee Flexion  5/5    Right Knee Extension  5/5    Left Knee Flexion  5/5    Left Knee Extension  5/5    Right/Left Ankle  Right;Left    Right Ankle Dorsiflexion  5/5    Left Ankle Dorsiflexion  5/5      Palpation   Palpation comment  significant spasmin bilat quads, >75% of left, >50% of right.  deep palpation of  deep lateral gluteals (-), ltd  by demin      Transfers   Five time sit to stand comments   10.35sec hands free, dynamic valgus knee knocking each time      Ambulation/Gait   Stairs  No assess at subsequent visit    Gait Comments  assess at subsequent visit             Objective measurements completed on examination: See above findings.      [facility] Adult PT Treatment/Exercise - 09/04/17 0001      Exercises   Exercises  Knee/Hip      Knee/Hip Exercises: Stretches   Quad Stretch  3 reps;30 seconds;Both quadruped to tall kneeing; progress quadruped to childs pose      Knee/Hip Exercises: Supine   Bridges  Both;1 set;10 reps             PT Education - 09/04/17 1439    Education provided  Yes    Education Details  strength deficits, and significant soft tissue restrictions in quads bilat    Person(s) Educated  Patient    Methods  Explanation;Demonstration    Comprehension  Need further instruction       PT Short Term Goals - 09/04/17 1449      PT SHORT TERM GOAL #1   Title  After 3 weeks patient will report decreased pain when performing stairs with grandchild loading.     Time  3    Period  Weeks    Status  New    Target Date  09/25/17      PT SHORT TERM GOAL #2   Title  After 3 weeks patient will report improved tolerance to stair climbing in home to access 2nd floor.     Time  3    Period  Weeks    Status  New    Target Date  09/25/17        PT Long Term Goals - 09/04/17 1451      PT LONG TERM GOAL #1   Title  After 6 weeks pt will demonstrate independence with advanced HEP for DC to continue prgress in strength and mobility for up to 35mp DC.     Time  6    Period  Weeks    Status  New    Target Date  10/16/17      PT LONG TERM GOAL #2   Title  After 6 weeks pt will demonstrate performance of 30sec chair rise test >12x hands free absent dynamic knee valgus.     Time  6    Period  Weeks    Target Date  10/16/17      PT LONG TERM GOAL #3   Title  After 6 weeks pt will  demonstrate ability to perform 16 steps with 40lbs front loaded without pain to improve ability to assist grandchildren up stairs for next 12 months.     Time  6    Period  Weeks    Status  New    Target Date  10/16/17             Plan - 09/04/17 1441    Clinical Impression Statement  TJullianna Gaboris a 65yowhite female who comes to [facility] OP c/o Left sided hip pain with left lateral thigh referral associated with stair climbing and joint stiffness that increases after sittin glonger than 30 minutes. Examination reveals intact sensation, generalized tightness in bilat hip rotators Rt>Lt  with<25% ROM deficits detected, pain with resisted Left hip internal rotation, weakness in bilat hip rotators Lt>Rt, weakness in hip extensors on Left, impaired biomechnics in transfers, and significant bilat spasm in quads. Pt will benefit from skilled PT intervention to address said impairments, to decrease pain, and improve tolerance to leisure and volunteer activty.     History and Personal Factors relevant to plan of care:  Pt reports presence of Lumbar compression fracture with no additional detail; history of osteopenia, 1 fall in 71mtripping over a rake.     Clinical Presentation  Stable    Clinical Presentation due to:  tests and measures.     Clinical Decision Making  Moderate    Rehab Potential  Good    PT Frequency  2x / week    PT Duration  6 weeks    PT Treatment/Interventions  ADLs/Self Care Home Management;Gait training;Passive range of motion;Dry needling;Manual techniques;Cognitive remediation;Moist Heat;Electrical Stimulation;DME Instruction;Therapeutic exercise;Balance training;Patient/family education;Therapeutic activities;Functional mobility training;Stair training    PT Next Visit Plan  Review goals, screen lumbar spine, gait and stairs observation, SLS for time. Expan upon HEP accoring to identified deficits.     PT Home Exercise Plan  Supine bridging, quadruped to child's pose quads  stretch.     Consulted and Agree with Plan of Care  Patient       Patient will benefit from skilled therapeutic intervention in order to improve the following deficits and impairments:  Improper body mechanics, Decreased mobility, Increased muscle spasms, Postural dysfunction, Decreased activity tolerance, Decreased range of motion, Decreased strength, Difficulty walking, Decreased balance  Visit Diagnosis: Pain in left hip - Plan: PT plan of care cert/re-cert  Stiffness of left hip, not elsewhere classified - Plan: PT plan of care cert/re-cert  Other muscle spasm - Plan: PT plan of care cert/re-cert     Problem List Patient Active Problem List   Diagnosis Date Noted  . Pain in toe 04/04/2013  . 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 OA (osteoarthritis) of knee 12/07/2011  . Knee pain 12/07/2011  . KNEE, ARTHRITIS, DEGEN./OSTEO 09/14/2009  . KNEE PAIN 09/14/2009   2:58 PM, 09/04/17 AEtta Grandchild PT, DPT [facility] [phone](office)       BEtta Grandchild1/28/2019, 2:57 PM  [address] [address]Phone: [phone]  Fax:  [phone] Name: TEMINA RIBAUDOMRN: [id number] of Birth: 709/19/1952

## 2017-09-07 ENCOUNTER — Telehealth (HOSPITAL_COMMUNITY): Payer: Self-pay | Admitting: Pulmonary Disease

## 2017-09-07 NOTE — Telephone Encounter (Signed)
09/07/17  has to babysit her g.children on this day

## 2017-09-08 ENCOUNTER — Ambulatory Visit (HOSPITAL_COMMUNITY): Payer: PPO

## 2017-09-08 ENCOUNTER — Telehealth (HOSPITAL_COMMUNITY): Payer: Self-pay

## 2017-09-08 NOTE — Telephone Encounter (Signed)
Patient called to cancel her appt and will see Zenia Resides on her next visit

## 2017-09-11 ENCOUNTER — Encounter (HOSPITAL_COMMUNITY): Payer: Self-pay

## 2017-09-25 ENCOUNTER — Ambulatory Visit
Admission: RE | Admit: 2017-09-25 | Discharge: 2017-09-25 | Disposition: A | Discharge status: Home / Self Care | Payer: PPO | Source: Ambulatory Visit | Attending: Orthopedic Surgery | Admitting: Orthopedic Surgery

## 2017-09-25 DIAGNOSIS — M4856XA Collapsed vertebra, not elsewhere classified, lumbar region, initial encounter for fracture: Secondary | ICD-10-CM

## 2017-09-25 DIAGNOSIS — M48061 Spinal stenosis, lumbar region without neurogenic claudication: Secondary | ICD-10-CM | POA: Diagnosis not present

## 2017-09-25 DIAGNOSIS — M5410 Radiculopathy, site unspecified: Secondary | ICD-10-CM

## 2017-09-29 ENCOUNTER — Encounter (INDEPENDENT_AMBULATORY_CARE_PROVIDER_SITE_OTHER): Payer: Self-pay | Admitting: Orthopedic Surgery

## 2017-09-29 ENCOUNTER — Telehealth (HOSPITAL_COMMUNITY): Payer: Self-pay | Admitting: Pulmonary Disease

## 2017-09-29 ENCOUNTER — Ambulatory Visit (INDEPENDENT_AMBULATORY_CARE_PROVIDER_SITE_OTHER): Payer: PPO | Admitting: Orthopedic Surgery

## 2017-09-29 DIAGNOSIS — M5410 Radiculopathy, site unspecified: Secondary | ICD-10-CM | POA: Diagnosis not present

## 2017-09-29 NOTE — Telephone Encounter (Signed)
09/29/17  pt called to say that her dr told her she could stop therapy

## 2017-10-01 ENCOUNTER — Encounter (INDEPENDENT_AMBULATORY_CARE_PROVIDER_SITE_OTHER): Payer: Self-pay | Admitting: Orthopedic Surgery

## 2017-10-01 NOTE — Progress Notes (Signed)
Office Visit Note   Patient: Tara Brennan           Date of Birth: March 22, 1951           MRN: [id number] Visit Date: 09/29/2017 Requested by: Sinda Du, [place], [place] [address] PCP: Sinda Du, MD  Subjective: Chief Complaint  Patient presents with  . Lower Back - Follow-up    HPI: Tara Brennan is a patient with low back pain.  She had a fall in 2007.  Since I have seen her she has had an MRI scan of her lumbar spine.  This scan shows an old L1 compression fracture.  She also has some mild left lateral recess narrowing at L2-3.  I think this accounts for her left-sided pain which is sporadic.              ROS: All systems reviewed are negative as they relate to the chief complaint within the history of present illness.  Patient denies  fevers or chills.   Assessment & Plan: Visit Diagnoses:  1. Radicular leg pain     Plan: Impression is sporadic left-sided radicular symptoms L2-3 narrowing on the left.  Takes occasional Advil for the symptoms.  Injection would be the next step if her symptoms warrant.  The compression fracture remains unchanged in appearance compared to 2007.  The scans are reviewed with the patient.  Follow-up with me as needed  Follow-Up Instructions: Return if symptoms worsen or fail to improve.   Orders:  No orders of the defined types were placed in this encounter.  No orders of the defined types were placed in this encounter.     Procedures: No procedures performed   Clinical Data: No additional findings.  Objective: Vital Signs: There were no vitals taken for this visit.  Physical Exam:   Constitutional: Patient appears well-developed HEENT:  Head: Normocephalic Eyes:EOM are normal Neck: Normal range of motion Cardiovascular: Normal rate Pulmonary/chest: Effort normal Neurologic: Patient is alert Skin: Skin is warm Psychiatric: Patient has normal mood and affect    Ortho Exam: Orthopedic  exam demonstrates normal body mass index normal gait and alignment.  No nerve root tension signs.  No muscle atrophy in the legs.  Ankle dorsiflexion plantarflexion is intact.  No other masses lymphadenopathy or skin changes noted in that left leg region.  Specialty Comments:  No specialty comments available.  Imaging: No results found.   PMFS History: Patient Active Problem List   Diagnosis Date Noted  . Pain in toe 04/04/2013  . 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 OA (osteoarthritis) of knee 12/07/2011  . Knee pain 12/07/2011  . KNEE, ARTHRITIS, DEGEN./OSTEO 09/14/2009  . KNEE PAIN 09/14/2009   Past Medical History:  Diagnosis Date  . GERD (gastroesophageal reflux disease)   . Osteopenia   . Ovarian cyst     Family History  Problem Relation Age of Onset  . Hypertension Mother   . Cancer Father   . Diabetes Sister   . Arthritis Unknown     Past Surgical History:  Procedure Laterality Date  . ABDOMINAL HYSTERECTOMY  1984  . ESOPHAGOGASTRODUODENOSCOPY  06/28/2012   Procedure: ESOPHAGOGASTRODUODENOSCOPY (EGD);  Surgeon: Jeryl Columbia, MD;  [facility] ENDOSCOPY;  Service: Endoscopy;  Laterality: N/A;  . OVARIAN CYST SURGERY    . PARTIAL HYSTERECTOMY     Social History   Occupational History  . Not on file  Tobacco Use  . Smoking status: Never Smoker  .  Smokeless tobacco: Never Used  Substance and Sexual Activity  . Alcohol use: No  . Drug use: No  . Sexual activity: Yes    Birth control/protection: Surgical

## 2017-10-02 ENCOUNTER — Ambulatory Visit (HOSPITAL_COMMUNITY): Payer: PPO

## 2017-10-09 ENCOUNTER — Encounter (HOSPITAL_COMMUNITY): Payer: Self-pay

## 2017-10-13 ENCOUNTER — Other Ambulatory Visit (HOSPITAL_COMMUNITY): Payer: Self-pay | Admitting: Pulmonary Disease

## 2017-10-13 DIAGNOSIS — R52 Pain, unspecified: Secondary | ICD-10-CM

## 2017-10-14 ENCOUNTER — Ambulatory Visit (HOSPITAL_COMMUNITY)
Admission: RE | Admit: 2017-10-14 | Discharge: 2017-10-14 | Disposition: A | Discharge status: Home / Self Care | Payer: PPO | Source: Ambulatory Visit | Attending: Pulmonary Disease | Admitting: Pulmonary Disease

## 2017-10-14 DIAGNOSIS — J984 Other disorders of lung: Secondary | ICD-10-CM | POA: Diagnosis not present

## 2017-10-14 DIAGNOSIS — R079 Chest pain, unspecified: Secondary | ICD-10-CM | POA: Diagnosis not present

## 2017-10-14 DIAGNOSIS — R52 Pain, unspecified: Secondary | ICD-10-CM

## 2017-10-16 ENCOUNTER — Encounter (HOSPITAL_COMMUNITY): Payer: Self-pay

## 2017-11-01 DIAGNOSIS — R079 Chest pain, unspecified: Secondary | ICD-10-CM | POA: Diagnosis not present

## 2017-11-01 DIAGNOSIS — E785 Hyperlipidemia, unspecified: Secondary | ICD-10-CM | POA: Diagnosis not present

## 2017-11-01 DIAGNOSIS — I10 Essential (primary) hypertension: Secondary | ICD-10-CM | POA: Diagnosis not present

## 2017-11-30 ENCOUNTER — Inpatient Hospital Stay (HOSPITAL_COMMUNITY)
Admission: EM | Admit: 2017-11-30 | Discharge: 2017-12-03 | DRG: 200 | Disposition: A | Discharge status: Home / Self Care | Payer: PPO | Attending: General Surgery | Admitting: General Surgery

## 2017-11-30 ENCOUNTER — Emergency Department (HOSPITAL_COMMUNITY): Payer: PPO

## 2017-11-30 ENCOUNTER — Encounter (HOSPITAL_COMMUNITY): Payer: Self-pay

## 2017-11-30 ENCOUNTER — Other Ambulatory Visit: Payer: Self-pay

## 2017-11-30 DIAGNOSIS — Z4682 Encounter for fitting and adjustment of non-vascular catheter: Secondary | ICD-10-CM | POA: Diagnosis not present

## 2017-11-30 DIAGNOSIS — S92411A Displaced fracture of proximal phalanx of right great toe, initial encounter for closed fracture: Secondary | ICD-10-CM | POA: Diagnosis not present

## 2017-11-30 DIAGNOSIS — S2241XA Multiple fractures of ribs, right side, initial encounter for closed fracture: Secondary | ICD-10-CM | POA: Diagnosis not present

## 2017-11-30 DIAGNOSIS — R51 Headache: Secondary | ICD-10-CM | POA: Diagnosis not present

## 2017-11-30 DIAGNOSIS — J90 Pleural effusion, not elsewhere classified: Secondary | ICD-10-CM | POA: Diagnosis not present

## 2017-11-30 DIAGNOSIS — Z8249 Family history of ischemic heart disease and other diseases of the circulatory system: Secondary | ICD-10-CM

## 2017-11-30 DIAGNOSIS — Z809 Family history of malignant neoplasm, unspecified: Secondary | ICD-10-CM | POA: Diagnosis not present

## 2017-11-30 DIAGNOSIS — R079 Chest pain, unspecified: Secondary | ICD-10-CM | POA: Diagnosis present

## 2017-11-30 DIAGNOSIS — M8580 Other specified disorders of bone density and structure, unspecified site: Secondary | ICD-10-CM | POA: Diagnosis present

## 2017-11-30 DIAGNOSIS — S92414A Nondisplaced fracture of proximal phalanx of right great toe, initial encounter for closed fracture: Secondary | ICD-10-CM | POA: Diagnosis present

## 2017-11-30 DIAGNOSIS — J939 Pneumothorax, unspecified: Secondary | ICD-10-CM | POA: Diagnosis not present

## 2017-11-30 DIAGNOSIS — S0003XA Contusion of scalp, initial encounter: Secondary | ICD-10-CM

## 2017-11-30 DIAGNOSIS — S0990XA Unspecified injury of head, initial encounter: Secondary | ICD-10-CM | POA: Diagnosis not present

## 2017-11-30 DIAGNOSIS — Z7983 Long term (current) use of bisphosphonates: Secondary | ICD-10-CM

## 2017-11-30 DIAGNOSIS — M79674 Pain in right toe(s): Secondary | ICD-10-CM

## 2017-11-30 DIAGNOSIS — J9383 Other pneumothorax: Secondary | ICD-10-CM | POA: Diagnosis not present

## 2017-11-30 DIAGNOSIS — Z833 Family history of diabetes mellitus: Secondary | ICD-10-CM | POA: Diagnosis not present

## 2017-11-30 DIAGNOSIS — I10 Essential (primary) hypertension: Secondary | ICD-10-CM | POA: Diagnosis not present

## 2017-11-30 DIAGNOSIS — W109XXA Fall (on) (from) unspecified stairs and steps, initial encounter: Secondary | ICD-10-CM | POA: Diagnosis not present

## 2017-11-30 DIAGNOSIS — Z7982 Long term (current) use of aspirin: Secondary | ICD-10-CM | POA: Diagnosis not present

## 2017-11-30 DIAGNOSIS — Z90710 Acquired absence of both cervix and uterus: Secondary | ICD-10-CM

## 2017-11-30 DIAGNOSIS — S270XXA Traumatic pneumothorax, initial encounter: Principal | ICD-10-CM | POA: Diagnosis present

## 2017-11-30 DIAGNOSIS — K219 Gastro-esophageal reflux disease without esophagitis: Secondary | ICD-10-CM | POA: Diagnosis not present

## 2017-11-30 LAB — COMPREHENSIVE METABOLIC PANEL
ALBUMIN: 4.7 g/dL (ref 3.5–5.0)
ALK PHOS: 87 U/L (ref 38–126)
ALT: 25 U/L (ref 14–54)
AST: 25 U/L (ref 15–41)
Anion gap: 12 (ref 5–15)
BUN: 21 mg/dL — ABNORMAL HIGH (ref 6–20)
CALCIUM: 10.5 mg/dL — AB (ref 8.9–10.3)
CO2: 28 mmol/L (ref 22–32)
CREATININE: 0.57 mg/dL (ref 0.44–1.00)
Chloride: 100 mmol/L — ABNORMAL LOW (ref 101–111)
GFR calc Af Amer: >60 mL/min (ref >60)
GFR calc non Af Amer: >60 mL/min (ref >60)
Glucose, Bld: 105 mg/dL — ABNORMAL HIGH (ref 65–99)
Potassium: 3.7 mmol/L (ref 3.5–5.1)
SODIUM: 140 mmol/L (ref 135–145)
Total Bilirubin: 0.8 mg/dL (ref 0.3–1.2)
Total Protein: 8.1 g/dL (ref 6.5–8.1)

## 2017-11-30 LAB — CBC WITH DIFFERENTIAL/PLATELET
BASOS PCT: 0 %
Basophils Absolute: 0 10*3/uL (ref 0.0–0.1)
EOS ABS: 0 10*3/uL (ref 0.0–0.7)
Eosinophils Relative: 0 %
HCT: 41.4 % (ref 36.0–46.0)
HEMOGLOBIN: 13.7 g/dL (ref 12.0–15.0)
Lymphocytes Relative: 9 %
Lymphs Abs: 1.3 10*3/uL (ref 0.7–4.0)
MCH: 28 pg (ref 26.0–34.0)
MCHC: 33.1 g/dL (ref 30.0–36.0)
MCV: 84.5 fL (ref 78.0–100.0)
Monocytes Absolute: 0.6 10*3/uL (ref 0.1–1.0)
Monocytes Relative: 5 %
NEUTROS PCT: 86 %
Neutro Abs: 12.2 10*3/uL — ABNORMAL HIGH (ref 1.7–7.7)
Platelets: 298 10*3/uL (ref 150–400)
RBC: 4.9 MIL/uL (ref 3.87–5.11)
RDW: 12.9 % (ref 11.5–15.5)
WBC: 14.2 10*3/uL — ABNORMAL HIGH (ref 4.0–10.5)

## 2017-11-30 LAB — PROTIME-INR
INR: 0.95
Prothrombin Time: 12.6 seconds (ref 11.4–15.2)

## 2017-11-30 LAB — APTT: APTT: 26 s (ref 24–36)

## 2017-11-30 MED ORDER — HYDRALAZINE HCL 20 MG/ML IJ SOLN: 10.0000 mg | INTRAMUSCULAR | Status: DC | PRN

## 2017-11-30 MED ORDER — FENTANYL CITRATE (PF) 100 MCG/2ML IJ SOLN
25.0000 ug | INTRAMUSCULAR | Status: DC | PRN
  Administered 2017-11-30: 25 ug via INTRAVENOUS

## 2017-11-30 MED ORDER — PANTOPRAZOLE SODIUM 40 MG PO TBEC
40.0000 mg | DELAYED_RELEASE_TABLET | Freq: Every day | ORAL | Status: DC
  Administered 2017-12-01 – 2017-12-02 (×2): 40 mg via ORAL
  Filled 2017-11-30 (×4): qty 1

## 2017-11-30 MED ORDER — VITAMIN D 1000 UNITS PO TABS
1000.0000 [IU] | ORAL_TABLET | Freq: Every day | ORAL | Status: DC
  Administered 2017-11-30 – 2017-12-03 (×4): 1000 [IU] via ORAL
  Filled 2017-11-30 (×4): qty 1

## 2017-11-30 MED ORDER — ONDANSETRON HCL 4 MG/2ML IJ SOLN
4.0000 mg | Freq: Four times a day (QID) | INTRAMUSCULAR | Status: DC | PRN
  Administered 2017-11-30: 4 mg via INTRAVENOUS
  Filled 2017-11-30: qty 2

## 2017-11-30 MED ORDER — ACETAMINOPHEN 325 MG PO TABS: 650.0000 mg | ORAL_TABLET | ORAL | Status: DC | PRN

## 2017-11-30 MED ORDER — ALENDRONATE SODIUM 70 MG PO TABS: 70.0000 mg | ORAL_TABLET | ORAL | Status: DC

## 2017-11-30 MED ORDER — FENTANYL CITRATE (PF) 100 MCG/2ML IJ SOLN
50.0000 ug | Freq: Once | INTRAMUSCULAR | Status: AC
  Administered 2017-11-30: 50 ug via INTRAVENOUS
  Filled 2017-11-30: qty 2

## 2017-11-30 MED ORDER — AMLODIPINE BESYLATE 5 MG PO TABS
5.0000 mg | ORAL_TABLET | Freq: Every day | ORAL | Status: DC
  Administered 2017-11-30 – 2017-12-03 (×4): 5 mg via ORAL
  Filled 2017-11-30 (×4): qty 1

## 2017-11-30 MED ORDER — ONDANSETRON 4 MG PO TBDP: 4.0000 mg | ORAL_TABLET | Freq: Four times a day (QID) | ORAL | Status: DC | PRN

## 2017-11-30 MED ORDER — ENOXAPARIN SODIUM 40 MG/0.4ML ~~LOC~~ SOLN
40.0000 mg | SUBCUTANEOUS | Status: DC
  Administered 2017-12-01 – 2017-12-03 (×3): 40 mg via SUBCUTANEOUS
  Filled 2017-11-30 (×3): qty 0.4

## 2017-11-30 MED ORDER — ASPIRIN 81 MG PO CHEW
81.0000 mg | CHEWABLE_TABLET | Freq: Every day | ORAL | Status: DC
Start: 2017-11-30 — End: 2017-12-03
  Administered 2017-11-30 – 2017-12-02 (×3): 81 mg via ORAL
  Filled 2017-11-30 (×3): qty 1

## 2017-11-30 MED ORDER — OXYCODONE HCL 5 MG PO TABS
5.0000 mg | ORAL_TABLET | ORAL | Status: DC | PRN
  Administered 2017-11-30 – 2017-12-01 (×2): 5 mg via ORAL
  Filled 2017-11-30 (×2): qty 1

## 2017-11-30 MED ORDER — ONDANSETRON HCL 4 MG/2ML IJ SOLN: 4.0000 mg | Freq: Four times a day (QID) | INTRAMUSCULAR | Status: DC | PRN

## 2017-11-30 MED ORDER — OXYCODONE HCL 5 MG PO TABS
10.0000 mg | ORAL_TABLET | ORAL | Status: DC | PRN
  Administered 2017-11-30 – 2017-12-01 (×2): 10 mg via ORAL
  Filled 2017-11-30 (×2): qty 2

## 2017-11-30 MED ORDER — KCL IN DEXTROSE-NACL 20-5-0.45 MEQ/L-%-% IV SOLN
INTRAVENOUS | Status: DC
  Administered 2017-11-30: 50 mL/h via INTRAVENOUS
  Administered 2017-12-01: 10:00:00 via INTRAVENOUS
  Filled 2017-11-30 (×2): qty 1000

## 2017-11-30 NOTE — ED Triage Notes (Signed)
Pt reports she missed a step coming down stairs and fell and struck head and left shoulder blade on hard wood floor.  Denies any LOC, vomiting, or visual changes.  Pt has swelling noted to back of head.

## 2017-11-30 NOTE — Procedures (Signed)
Chest Tube Insertion Procedure Note  Indications:  Clinically significant Right pneumothorax  Pre-operative Diagnosis: Right pneumothorax  Post-operative Diagnosis: Right pneumothorax  Surgeon: Georganna Skeans, MD  Assistant: - Melina Modena, [facility]  Procedure Details  Informed consent was obtained for the procedure, including sedation.  Risks of lung perforation, hemorrhage, arrhythmia, and adverse drug reaction were discussed.   After sterile skin prep, using sterile technique, a 14 French tube was placed in the R anterior axillary line above the nipple level using Seldinger technique. It was connected to a pleru-evac and a sterile dressing was applied. She tolerated it well.  Findings: Rush of air and small amount of blood  Estimated Blood Loss:  less than 50 mL         Specimens:  None              Complications:  None; patient tolerated the procedure well.         Disposition: admit to SDU         Condition: stable  Georganna Skeans, MD, MPH, FACS Trauma: [phone] General Surgery: [phone]

## 2017-11-30 NOTE — Progress Notes (Signed)
Orthopedic Tech Progress Note Patient Details:  CEYLIN DREIBELBIS [birth year] [id number]  Ortho Devices Type of Ortho Device: Postop shoe/boot Ortho Device/Splint Location: RLE Ortho Device/Splint Interventions: Ordered, Application   Post Interventions Patient Tolerated: Well Instructions Provided: Care of device   Braulio Bosch 11/30/2017, 7:43 PM

## 2017-11-30 NOTE — ED Notes (Signed)
Pt says she was walking down three wooded steps and missed one step.  C/o pain to back of head (K[place]), right scapula and right great toe; rating pain 6/10.  Denies any dizziness or LOC.  Takes 81 mg of ASA nightly, no other blood thinners.

## 2017-11-30 NOTE — ED Notes (Signed)
C collar removed by Dr. Melina Copa

## 2017-11-30 NOTE — ED Notes (Signed)
PT arrives to [facility]

## 2017-11-30 NOTE — H&P (Signed)
[facility] Admission Note  Tara Brennan [birth year]  [id number].    Requesting MD: Melina Copa, MD Chief Complaint/Reason for Consult: Fall, pneumothorax  HPI:  Tara Brennan is a 67 y/o F with a PMH HTN who presented to [facility] from [facility] after a fall that occurred early this morning. Patient was walking down stairs in her home when she missed a step and fell down roughly 3 stairs, hitting her head and her R side on a wood floor. Denies LOC. Ambulatory after the event. Decided to go to the ED due to a knot on her head. Workup negative for acute intracranial injury but significant for R rib fractures and a moderate R pneumothorax so she was transferred to the [facility] trauma center for further treatment. Currently c/o R posterior chest wall pain that radiates through to her anterior chest wall, worse with movement. Also c/o R great toe pain. Denies neck or abdominal pain. Denies nausea or vomiting. Denies use of blood thinning medications.   ROS: Review of Systems  Constitutional: Negative for chills and fever.  Respiratory: Negative for cough, hemoptysis and shortness of breath.   Cardiovascular: Positive for chest pain.  Gastrointestinal: Negative for abdominal pain, heartburn, nausea and vomiting.  Musculoskeletal: Positive for back pain. Negative for neck pain.  Neurological: Negative.   All other systems reviewed and are negative.   Family History  Problem Relation Age of Onset  . Hypertension Mother   . Cancer Father   . Diabetes Sister   . Arthritis Unknown     Past Medical History:  Diagnosis Date  . GERD (gastroesophageal reflux disease)   . Osteopenia   . Ovarian cyst     Past Surgical History:  Procedure Laterality Date  . ABDOMINAL HYSTERECTOMY  1984  . ESOPHAGOGASTRODUODENOSCOPY  06/28/2012   Procedure: ESOPHAGOGASTRODUODENOSCOPY (EGD);  Surgeon: Jeryl Columbia, MD;  Location: [facility] ENDOSCOPY;  Service: Endoscopy;  Laterality: N/A;  . OVARIAN  CYST SURGERY    . PARTIAL HYSTERECTOMY      Social History:  reports that she has never smoked. She has never used smokeless tobacco. She reports that she does not drink alcohol or use drugs.  Allergies: No Known Allergies   (Not in a hospital admission)  Blood pressure (!) 151/68, pulse 82, temperature 98.4 F (36.9 C), temperature source Oral, resp. rate 15, height _0  (1.702 m), weight 79.4 kg (175 lb), SpO2 98 %. Physical Exam: Physical Exam  Constitutional: She is oriented to person, place, and time. She appears well-developed and well-nourished. No distress.  HENT:  Head: Normocephalic.  Small, tender contusion over r parietal scalp  Eyes: Pupils are equal, round, and reactive to light. EOM are normal. Right eye exhibits no discharge. Left eye exhibits no discharge. No scleral icterus.  Neck: Normal range of motion. Neck supple. No tracheal deviation present. No thyromegaly present.  Cardiovascular: Normal rate, regular rhythm, normal heart sounds and intact distal pulses. Exam reveals no friction rub.  No murmur heard. Pulmonary/Chest: Effort normal. No stridor. No respiratory distress. She has no wheezes. She has no rales. She exhibits tenderness.  Slightly decreased breath sounds right lung apex  Abdominal: Soft. Bowel sounds are normal. She exhibits no distension and no mass. There is no tenderness. There is no guarding.  Musculoskeletal: Normal range of motion. She exhibits tenderness. She exhibits no edema or deformity.  TTP R great toe, some ecchymosis  Neurological: She is alert and oriented to person, place, and time. No cranial  nerve deficit or sensory deficit.  Skin: Skin is warm and dry. No rash noted.  Psychiatric: She has a normal mood and affect. Her behavior is normal. Thought content normal.    Results for orders placed or performed during the hospital encounter of 11/30/17 (from the past 48 hour(s))  CBC with Differential/Platelet     Status: Abnormal    Collection Time: 11/30/17  9:29 AM  Result Value Ref Range   WBC 14.2 (H) 4.0 - 10.5 K/uL   RBC 4.90 3.87 - 5.11 MIL/uL   Hemoglobin 13.7 12.0 - 15.0 g/dL   HCT 41.4 36.0 - 46.0 %   MCV 84.5 78.0 - 100.0 fL   MCH 28.0 26.0 - 34.0 pg   MCHC 33.1 30.0 - 36.0 g/dL   RDW 12.9 11.5 - 15.5 %   Platelets 298 150 - 400 K/uL   Neutrophils Relative % 86 %   Neutro Abs 12.2 (H) 1.7 - 7.7 K/uL   Lymphocytes Relative 9 %   Lymphs Abs 1.3 0.7 - 4.0 K/uL   Monocytes Relative 5 %   Monocytes Absolute 0.6 0.1 - 1.0 K/uL   Eosinophils Relative 0 %   Eosinophils Absolute 0.0 0.0 - 0.7 K/uL   Basophils Relative 0 %   Basophils Absolute 0.0 0.0 - 0.1 K/uL    Comment: Performed at [facility], [address] [place], [place] [address]  Comprehensive metabolic panel     Status: Abnormal   Collection Time: 11/30/17  9:29 AM  Result Value Ref Range   Sodium 140 135 - 145 mmol/L   Potassium 3.7 3.5 - 5.1 mmol/L   Chloride 100 (L) 101 - 111 mmol/L   CO2 28 22 - 32 mmol/L   Glucose, Bld 105 (H) 65 - 99 mg/dL   BUN 21 (H) 6 - 20 mg/dL   Creatinine, Ser 0.57 0.44 - 1.00 mg/dL   Calcium 10.5 (H) 8.9 - 10.3 mg/dL   Total Protein 8.1 6.5 - 8.1 g/dL   Albumin 4.7 3.5 - 5.0 g/dL   AST 25 15 - 41 U/L   ALT 25 14 - 54 U/L   Alkaline Phosphatase 87 38 - 126 U/L   Total Bilirubin 0.8 0.3 - 1.2 mg/dL   GFR calc non Af Amer >60 >60 mL/min   GFR calc Af Amer >60 >60 mL/min    Comment: (NOTE) The eGFR has been calculated using the CKD EPI equation. This calculation has not been validated in all clinical situations. eGFR's persistently <60 mL/min signify possible Chronic Kidney Disease.    Anion gap 12 5 - 15    Comment: Performed at [facility], [address], [place], [place] [address]  Protime-INR     Status: None   Collection Time: 11/30/17  9:29 AM  Result Value Ref Range   Prothrombin Time 12.6 11.4 - 15.2 seconds   INR 0.95     Comment: Performed at [facility], [address], [place],  [place] [address]  APTT     Status: None   Collection Time: 11/30/17  9:29 AM  Result Value Ref Range   aPTT 26 24 - 36 seconds    Comment: Performed at [facility], [address] [place], [place] [address]   Dg Ribs Unilateral W/chest Right  Result Date: 11/30/2017 CLINICAL DATA:  Right upper rib pain after fall. EXAM: RIGHT RIBS AND CHEST - 3+ VIEW COMPARISON:  Chest x-ray dated October 14, 2017. FINDINGS: Acute nondisplaced fracture of the right posterior  fifth rib. Acute minimally displaced fracture of the right posterior sixth rib. Moderate right pneumothorax. Trachea is midline. The heart size and mediastinal contours are within normal limits. Normal pulmonary vascularity. No consolidation or pleural effusion. IMPRESSION: 1. Acute fractures of the right posterior fifth and sixth ribs with moderate right pneumothorax. No definite tension component. Critical Value/emergent results were called by telephone at the time of interpretation on 11/30/2017 at 8:45 am to Dr. Julianne Rice , who verbally acknowledged these results. Electronically Signed   By: Titus Dubin M.D.   On: 11/30/2017 08:47   Ct Head Wo Contrast  Result Date: 11/30/2017 CLINICAL DATA:  Pain following fall EXAM: CT HEAD WITHOUT CONTRAST TECHNIQUE: Contiguous axial images were obtained from the base of the skull through the vertex without intravenous contrast. COMPARISON:  None. FINDINGS: Brain: The ventricles are normal in size and configuration. There is no intracranial mass hemorrhage, extra-axial fluid collection, or midline shift. Gray-white compartments appear normal. No evident acute infarct. Vascular: There is no appreciable hyperdense vessel. There is slight calcification in the carotid siphon regions. Skull: The bony calvarium appears intact. There is a right parietal scalp hematoma. Sinuses/Orbits: There is mucosal thickening in multiple ethmoid air cells. Other visualized paranasal sinuses are clear. Orbits appear symmetric  bilaterally. Other: The mastoid air cells are clear. IMPRESSION: No intracranial mass or hemorrhage. Gray-white compartments appear normal. There is a right parietal scalp hematoma without underlying fracture. There is mucosal thickening in several ethmoid air cells. Electronically Signed   By: Lowella Grip III M.D.   On: 11/30/2017 08:24    Assessment/Plan Fall down 3 stairs R parietal scalp hematoma - CT head negative Right Rib 5-6 FX  R PTX - s/p insertion pigtail chest tube, -20 cm suction, CXR now and in AM R great toe pain - will order plain film  HTN - continue home Norvasc  FEN: Regular ID: none VTE: SCD's, Lovenox    Dispo: admit to [place] 11/30/2017, 11:38 AM Pager: [phone] Consults: [phone] Mon-Fri 7:00 am-4:30 pm Sat-Sun 7:00 am-11:30 am

## 2017-11-30 NOTE — ED Notes (Signed)
Dr Butler at bedside.  

## 2017-11-30 NOTE — Consult Note (Signed)
Reason for Consult:Great toe fx Referring Physician: [name] is an 67 y.o. female.  HPI: Tara Brennan was coming down her stairs at home when she missed one and fell about 3 steps. She denies any LOC, syncope, or presyncope. She got up and was sore but not too bad but because she had a knot on her head she decided to come to ED for evaluation. She was found to have a PTX and was transferred to [facility] for treatment by the trauma team. X-rays also showed a small fx of the proximal phalanx of her right great toe and orthopedic surgery was consulted. She is retired but takes care of her grandchildren 42yo and 30m  Past Medical History:  Diagnosis Date  . GERD (gastroesophageal reflux disease)   . Osteopenia   . Ovarian cyst     Past Surgical History:  Procedure Laterality Date  . ABDOMINAL HYSTERECTOMY  1984  . ESOPHAGOGASTRODUODENOSCOPY  06/28/2012   Procedure: ESOPHAGOGASTRODUODENOSCOPY (EGD);  Surgeon: MJeryl Columbia MD;  [facility];  Service: Endoscopy;  Laterality: N/A;  . OVARIAN CYST SURGERY    . PARTIAL HYSTERECTOMY      Family History  Problem Relation Age of Onset  . Hypertension Mother   . Cancer Father   . Diabetes Sister   . Arthritis Unknown     Social History:  reports that she has never smoked. She has never used smokeless tobacco. She reports that she does not drink alcohol or use drugs.  Allergies: No Known Allergies  Medications: I have reviewed the patient's current medications.  Results for orders placed or performed during the hospital encounter of 11/30/17 (from the past 48 hour(s))  CBC with Differential/Platelet     Status: Abnormal   Collection Time: 11/30/17  9:29 AM  Result Value Ref Range   WBC 14.2 (H) 4.0 - 10.5 K/uL   RBC 4.90 3.87 - 5.11 MIL/uL   Hemoglobin 13.7 12.0 - 15.0 g/dL   HCT 41.4 36.0 - 46.0 %   MCV 84.5 78.0 - 100.0 fL   MCH 28.0 26.0 - 34.0 pg   MCHC 33.1 30.0 - 36.0 g/dL   RDW 12.9 11.5 - 15.5 %    Platelets 298 150 - 400 K/uL   Neutrophils Relative % 86 %   Neutro Abs 12.2 (H) 1.7 - 7.7 K/uL   Lymphocytes Relative 9 %   Lymphs Abs 1.3 0.7 - 4.0 K/uL   Monocytes Relative 5 %   Monocytes Absolute 0.6 0.1 - 1.0 K/uL   Eosinophils Relative 0 %   Eosinophils Absolute 0.0 0.0 - 0.7 K/uL   Basophils Relative 0 %   Basophils Absolute 0.0 0.0 - 0.1 K/uL    Comment: [facility] at [facility] [address], [place] [address] Comprehensive metabolic panel     Status: Abnormal   Collection Time: 11/30/17  9:29 AM  Result Value Ref Range   Sodium 140 135 - 145 mmol/L   Potassium 3.7 3.5 - 5.1 mmol/L   Chloride 100 (L) 101 - 111 mmol/L   CO2 28 22 - 32 mmol/L   Glucose, Bld 105 (H) 65 - 99 mg/dL   BUN 21 (H) 6 - 20 mg/dL   Creatinine, Ser 0.57 0.44 - 1.00 mg/dL   Calcium 10.5 (H) 8.9 - 10.3 mg/dL   Total Protein 8.1 6.5 - 8.1 g/dL   Albumin 4.7 3.5 - 5.0 g/dL   AST 25 15 - 41 U/L  ALT 25 14 - 54 U/L   Alkaline Phosphatase 87 38 - 126 U/L   Total Bilirubin 0.8 0.3 - 1.2 mg/dL   GFR calc non Af Amer >60 >60 mL/min   GFR calc Af Amer >60 >60 mL/min    Comment: (NOTE) The eGFR has been calculated using the CKD EPI equation. This calculation has not been validated in all clinical situations. eGFR's persistently <60 mL/min signify possible Chronic Kidney Disease.    Anion gap 12 5 - 15    Comment: Performed at [facility], [address], [place], [place] [address]  Protime-INR     Status: None   Collection Time: 11/30/17  9:29 AM  Result Value Ref Range   Prothrombin Time 12.6 11.4 - 15.2 seconds   INR 0.95     Comment: Performed at [facility], [address], [place], [place] [address]  APTT     Status: None   Collection Time: 11/30/17  9:29 AM  Result Value Ref Range   aPTT 26 24 - 36 seconds    Comment: Performed at [facility], [address], [place], [place] [address]    Dg Ribs Unilateral W/chest Right  Result Date: 11/30/2017 CLINICAL DATA:  Right  upper rib pain after fall. EXAM: RIGHT RIBS AND CHEST - 3+ VIEW COMPARISON:  Chest x-ray dated October 14, 2017. FINDINGS: Acute nondisplaced fracture of the right posterior fifth rib. Acute minimally displaced fracture of the right posterior sixth rib. Moderate right pneumothorax. Trachea is midline. The heart size and mediastinal contours are within normal limits. Normal pulmonary vascularity. No consolidation or pleural effusion. IMPRESSION: 1. Acute fractures of the right posterior fifth and sixth ribs with moderate right pneumothorax. No definite tension component. Critical Value/emergent results were called by telephone at the time of interpretation on 11/30/2017 at 8:45 am to Dr. Julianne Rice , who verbally acknowledged these results. Electronically Signed   By: Titus Dubin M.D.   On: 11/30/2017 08:47   Ct Head Wo Contrast  Result Date: 11/30/2017 CLINICAL DATA:  Pain following fall EXAM: CT HEAD WITHOUT CONTRAST TECHNIQUE: Contiguous axial images were obtained from the base of the skull through the vertex without intravenous contrast. COMPARISON:  None. FINDINGS: Brain: The ventricles are normal in size and configuration. There is no intracranial mass hemorrhage, extra-axial fluid collection, or midline shift. Gray-white compartments appear normal. No evident acute infarct. Vascular: There is no appreciable hyperdense vessel. There is slight calcification in the carotid siphon regions. Skull: The bony calvarium appears intact. There is a right parietal scalp hematoma. Sinuses/Orbits: There is mucosal thickening in multiple ethmoid air cells. Other visualized paranasal sinuses are clear. Orbits appear symmetric bilaterally. Other: The mastoid air cells are clear. IMPRESSION: No intracranial mass or hemorrhage. Gray-white compartments appear normal. There is a right parietal scalp hematoma without underlying fracture. There is mucosal thickening in several ethmoid air cells. Electronically Signed   By:  Lowella Grip III M.D.   On: 11/30/2017 08:24   Dg Chest Port 1 View  Result Date: 11/30/2017 CLINICAL DATA:  Right-sided chest tube placement for pneumothorax. EXAM: PORTABLE CHEST 1 VIEW COMPARISON:  Earlier same day FINDINGS: Small bore right thoracostomy tube has been placed. Right pneumothorax is smaller, estimated at 10-15%. Mild basilar atelectasis persists. Heart size is normal. Fracture of the right posterior fifth and sixth ribs. IMPRESSION: Right thoracostomy tube in place. Pneumothorax is smaller, now 10-15%. Electronically Signed   By: Nelson Chimes M.D.   On: 11/30/2017 12:42  Dg Foot Complete Right  Result Date: 11/30/2017 CLINICAL DATA:  [place] down stairs today.  Foot pain. EXAM: RIGHT FOOT COMPLETE - 3+ VIEW COMPARISON:  04/04/2013.  02/10/2006. FINDINGS: Acute fracture at the proximal medial corner of the proximal phalanx of the great toe. Fracture does extend to the articular surface. No significant displacement. No other acute finding. Old healed fracture of the proximal phalanx of the small toe. IMPRESSION: Acute fracture of the proximal medial corner of the proximal phalanx of the great toe. Electronically Signed   By: Nelson Chimes M.D.   On: 11/30/2017 12:56    Review of Systems  Constitutional: Negative for weight loss.  HENT: Negative for ear discharge, ear pain, hearing loss and tinnitus.   Eyes: Negative for blurred vision, double vision, photophobia and pain.  Respiratory: Negative for cough, sputum production and shortness of breath.   Cardiovascular: Negative for chest pain.  Gastrointestinal: Negative for abdominal pain, nausea and vomiting.  Genitourinary: Negative for dysuria, flank pain, frequency and urgency.  Musculoskeletal: Positive for back pain and joint pain (Right great toe). Negative for falls, myalgias and neck pain.  Neurological: Negative for dizziness, tingling, sensory change, focal weakness, loss of consciousness and headaches.   Endo/Heme/Allergies: Does not bruise/bleed easily.  Psychiatric/Behavioral: Negative for depression, memory loss and substance abuse. The patient is not nervous/anxious.    Blood pressure 125/74, pulse 73, temperature 98.4 F (36.9 C), temperature source Oral, resp. rate 14, height 5' 7" (1.702 m), weight 79.4 kg (175 lb), SpO2 96 %. Physical Exam  Constitutional: She appears well-developed and well-nourished. No distress.  HENT:  Head: Normocephalic and atraumatic.  Eyes: Conjunctivae are normal. Right eye exhibits no discharge. Left eye exhibits no discharge. No scleral icterus.  Neck: Normal range of motion.  Cardiovascular: Normal rate and regular rhythm.  Respiratory: Effort normal. No respiratory distress.  Musculoskeletal:  RLE No traumatic wounds or rash  Ecchymosis, mild edema, mild TTP base of great toe  No knee or ankle effusion  Knee stable to varus/ valgus and anterior/posterior stress  Sens DPN, SPN, TN intact  Motor EHL, ext, flex, evers 5/5  DP 2+, PT 2+, No significant edema  Neurological: She is alert.  Skin: Skin is warm and dry. She is not diaphoretic.  Psychiatric: She has a normal mood and affect. Her behavior is normal.    Assessment/Plan: Right great toe proximal phalanx fx -- WBAT in post-op shoe. Should do well with non-operative treatment. Right rib fxs w/PTX s/p CT    Lisette Abu, PA-C Orthopedic Surgery [phone] 11/30/2017, 1:47 PM

## 2017-11-30 NOTE — ED Notes (Signed)
Trauma PA paged to Dr. Melina Copa @ [phone].

## 2017-11-30 NOTE — ED Provider Notes (Signed)
[facility] EMERGENCY DEPARTMENT Provider Note   CSN: [id number] Arrival date & time: 11/30/17  3151     History   Chief Complaint Chief Complaint  Patient presents with  . Fall    HPI Tara Brennan is a 67 y.o. female.  HPI Patient states that she missed a step and fell down 3 steps.  Denied lightheadedness or any symptoms prior to the fall.  Struck the back of her head and the right side of her thoracic back.  Denies loss of consciousness.  Has hematoma to the occipital region and complains of mild headache as well as right-sided back pain.  She did note some bruising to her right great toe.  Patient does not take any blood thinner.  She has no focal weakness or numbness.  No nausea or vomiting.  No visual changes. Past Medical History:  Diagnosis Date  . GERD (gastroesophageal reflux disease)   . Osteopenia   . Ovarian cyst     Patient Active Problem List   Diagnosis Date Noted  . Pain in toe 04/04/2013  . 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 OA (osteoarthritis) of knee 12/07/2011  . Knee pain 12/07/2011  . KNEE, ARTHRITIS, DEGEN./OSTEO 09/14/2009  . KNEE PAIN 09/14/2009    Past Surgical History:  Procedure Laterality Date  . ABDOMINAL HYSTERECTOMY  1984  . ESOPHAGOGASTRODUODENOSCOPY  06/28/2012   Procedure: ESOPHAGOGASTRODUODENOSCOPY (EGD);  Surgeon: Jeryl Columbia, MD;  Location: [facility] ENDOSCOPY;  Service: Endoscopy;  Laterality: N/A;  . OVARIAN CYST SURGERY    . PARTIAL HYSTERECTOMY       OB History    Gravida  3   Para  2   Term      Preterm  2   AB  1   Living  2     SAB  1   TAB      Ectopic      Multiple      Live Births               Home Medications    Prior to Admission medications   Medication Sig Start Date End Date Taking? Authorizing Provider  alendronate (FOSAMAX) 70 MG tablet Take 70 mg by mouth every 7 (seven) days. Take on sundays  Take with a full glass of water on an empty stomach.     [provider]    amLODipine (NORVASC) 5 MG tablet TK 1 T PO QD 08/17/17   [provider]  aspirin 81 MG chewable tablet Chew 81 mg by mouth at bedtime.      [provider]  cholecalciferol (VITAMIN D) 1000 units tablet Take 1,000 Units by mouth daily.    [provider]    Family History Family History  Problem Relation Age of Onset  . Hypertension Mother   . Cancer Father   . Diabetes Sister   . Arthritis Unknown     Social History Social History   Tobacco Use  . Smoking status: Never Smoker  . Smokeless tobacco: Never Used  Substance Use Topics  . Alcohol use: No  . Drug use: No     Allergies   Patient has no known allergies.   Review of Systems Review of Systems  Constitutional: Negative for chills and fever.  HENT: Negative for facial swelling and trouble swallowing.   Eyes: Negative for visual disturbance.  Respiratory: Negative for shortness of breath.   Cardiovascular: Negative for chest pain.  Gastrointestinal: Negative for abdominal pain, nausea and  vomiting.  Musculoskeletal: Positive for back pain. Negative for neck pain.  Skin: Positive for wound. Negative for rash.  Neurological: Positive for headaches. Negative for dizziness, syncope, speech difficulty, weakness, light-headedness and numbness.  All other systems reviewed and are negative.    Physical Exam Updated Vital Signs BP (!) 146/81 (BP Location: Right Arm)   Pulse 99   Temp 98.4 F (36.9 C) (Oral)   Resp 20   Ht 5\' 7"  (1.702 m)   Wt 79.4 kg (175 lb)   SpO2 100%   BMI 27.41 kg/m   Physical Exam  Constitutional: She is oriented to person, place, and time. She appears well-developed and well-nourished. No distress.  HENT:  Head: Normocephalic.  Mouth/Throat: Oropharynx is clear and moist.  Patient with moderate size hematoma to the right occipital region of her scalp.  Midface is stable.  No intraoral trauma.  Eyes: Pupils are equal, round, and reactive to light. EOM are  normal.  Neck: Normal range of motion. Neck supple.  No posterior midline cervical tenderness to palpation.  Cardiovascular: Normal rate and regular rhythm.  Pulmonary/Chest: Effort normal and breath sounds normal.  Abdominal: Soft. Bowel sounds are normal. There is no tenderness. There is no rebound and no guarding.  Musculoskeletal: Normal range of motion. She exhibits tenderness. She exhibits no edema.  Patient has tenderness to the right scapular region.  There is no obvious deformity.  No midline thoracic or lumbar tenderness.  Patient does have some bruising noted to her right great toe but has full range of motion without deformity.  Distal pulses are 2+.  Pelvis is stable.  Neurological: She is alert and oriented to person, place, and time.  5/5 motor in all extremities.  Sensation fully intact.  Skin: Skin is warm and dry. No rash noted. She is not diaphoretic. No erythema.  Psychiatric: She has a normal mood and affect. Her behavior is normal.  Nursing note and vitals reviewed.    ED Treatments / Results  Labs (all labs ordered are listed, but only abnormal results are displayed) Labs Reviewed  CBC WITH DIFFERENTIAL/PLATELET  COMPREHENSIVE METABOLIC PANEL  PROTIME-INR  APTT    EKG EKG Interpretation  Date/Time:  Thursday November 30 2017 09:12:02 EDT Ventricular Rate:  85 PR Interval:    QRS Duration: 98 QT Interval:  374 QTC Calculation: 445 R Axis:   79 Text Interpretation:  Sinus rhythm Confirmed by Julianne Rice [phone]) on 11/30/2017 9:24:41 AM   Radiology Dg Ribs Unilateral W/chest Right  Result Date: 11/30/2017 CLINICAL DATA:  Right upper rib pain after fall. EXAM: RIGHT RIBS AND CHEST - 3+ VIEW COMPARISON:  Chest x-ray dated October 14, 2017. FINDINGS: Acute nondisplaced fracture of the right posterior fifth rib. Acute minimally displaced fracture of the right posterior sixth rib. Moderate right pneumothorax. Trachea is midline. The heart size and mediastinal  contours are within normal limits. Normal pulmonary vascularity. No consolidation or pleural effusion. IMPRESSION: 1. Acute fractures of the right posterior fifth and sixth ribs with moderate right pneumothorax. No definite tension component. Critical Value/emergent results were called by telephone at the time of interpretation on 11/30/2017 at 8:45 am to Dr. Julianne Rice , who verbally acknowledged these results. Electronically Signed   By: Titus Dubin M.D.   On: 11/30/2017 08:47   Ct Head Wo Contrast  Result Date: 11/30/2017 CLINICAL DATA:  Pain following fall EXAM: CT HEAD WITHOUT CONTRAST TECHNIQUE: Contiguous axial images were obtained from the base of the skull through the  vertex without intravenous contrast. COMPARISON:  None. FINDINGS: Brain: The ventricles are normal in size and configuration. There is no intracranial mass hemorrhage, extra-axial fluid collection, or midline shift. Gray-white compartments appear normal. No evident acute infarct. Vascular: There is no appreciable hyperdense vessel. There is slight calcification in the carotid siphon regions. Skull: The bony calvarium appears intact. There is a right parietal scalp hematoma. Sinuses/Orbits: There is mucosal thickening in multiple ethmoid air cells. Other visualized paranasal sinuses are clear. Orbits appear symmetric bilaterally. Other: The mastoid air cells are clear. IMPRESSION: No intracranial mass or hemorrhage. Gray-white compartments appear normal. There is a right parietal scalp hematoma without underlying fracture. There is mucosal thickening in several ethmoid air cells. Electronically Signed   By: Lowella Grip III M.D.   On: 11/30/2017 08:24    Procedures Procedures (including critical care time)  Medications Ordered in ED Medications - No data to display   Initial Impression / Assessment and Plan / ED Course  I have reviewed the triage vital signs and the nursing notes.  Pertinent labs & imaging results  that were available during my care of the patient were reviewed by me and considered in my medical decision making (see chart for details).     Patient in no respiratory distress.  Maintaining oxygen saturations in high 90s.  Placed on supplemental oxygen.  CT head without intracranial findings.  Rib films with posterior fifth and sixth rib fractures and moderate pneumothorax.  Discussed with Dr. Arnoldo Morale.  Referred to trauma service at [facility].  Dr. Grandville Silos reviewed patient's films.  We will see patient in the emergency department, likely place pigtail catheter and admit.  Dr. Ellie Lunch is aware patient is being transferred.  Advised to contact Dr. Grandville Silos on patient's arrival.  Final Clinical Impressions(s) / ED Diagnoses   Final diagnoses:  Scalp hematoma, initial encounter  Closed traumatic fracture of ribs of right side with pneumothorax, initial encounter    ED Discharge Orders    None       Julianne Rice, MD 11/30/17 [phone]

## 2017-11-30 NOTE — ED Provider Notes (Signed)
67 year old female transferred from any plan for trauma evaluation.  She states earlier this morning she slipped going down the stairs and missed about 3 steps landing on her right chest wall and hitting her head.  There is no LOC.  She presented to any pain because of the head injury had a CT of her head that was unremarkable.  She also had chest x-ray which showed a approximate 30% right pneumo and right rib fractures.  Patient is complaining about 5 out of 10 pain.  She also has an injury to her right great toe that does not appear to have been imaged.  She denies feeling short of breath but has increased pain with taking a deep breath.  She denies any abdominal pain.  There was no neck pain no numbness no weakness.  On exam she is got a saturation of 98% on room air resting very comfortably.  She is got a right parietal skull hematoma.  Full range of motion neck no midline tenderness and c-collar was removed by me.  Lungs she is decreased on the right.  Heart regular rate and rhythm abdomen is soft nontender full range of motion of all extremities.  She is got some ecchymosis at the base of her right great toe.   I reviewed her prior imaging and lab work.   Trauma was paged for evaluation.  Clinical Course as of Dec 01 1530  Thu Nov 30, 2017  1127 Discussed with Dr. Grandville Silos trauma surgery who his plan is to put a pigtail in and admit the patient to his service.  Reevaluated with patient and the PA was already meeting with her   [MB]    Clinical Course User Index [MB] Hayden Rasmussen, MD      Hayden Rasmussen, MD 11/30/17 [phone]

## 2017-11-30 NOTE — ED Notes (Signed)
Trauma PA returned page and made aware pt is here.

## 2017-12-01 ENCOUNTER — Inpatient Hospital Stay (HOSPITAL_COMMUNITY): Payer: PPO

## 2017-12-01 LAB — BASIC METABOLIC PANEL
Anion gap: 9 (ref 5–15)
BUN: 16 mg/dL (ref 6–20)
CALCIUM: 9 mg/dL (ref 8.9–10.3)
CO2: 26 mmol/L (ref 22–32)
Chloride: 104 mmol/L (ref 101–111)
Creatinine, Ser: 0.74 mg/dL (ref 0.44–1.00)
GLUCOSE: 113 mg/dL — AB (ref 65–99)
POTASSIUM: 4.2 mmol/L (ref 3.5–5.1)
Sodium: 139 mmol/L (ref 135–145)

## 2017-12-01 LAB — CBC
HCT: 36 % (ref 36.0–46.0)
Hemoglobin: 12.1 g/dL (ref 12.0–15.0)
MCH: 28.7 pg (ref 26.0–34.0)
MCHC: 33.6 g/dL (ref 30.0–36.0)
MCV: 85.5 fL (ref 78.0–100.0)
PLATELETS: 182 10*3/uL (ref 150–400)
RBC: 4.21 MIL/uL (ref 3.87–5.11)
RDW: 13.6 % (ref 11.5–15.5)
WBC: 7.7 10*3/uL (ref 4.0–10.5)

## 2017-12-01 LAB — MRSA PCR SCREENING: MRSA by PCR: NEGATIVE

## 2017-12-01 LAB — HIV ANTIBODY (ROUTINE TESTING W REFLEX): HIV SCREEN 4TH GENERATION: NONREACTIVE

## 2017-12-01 MED ORDER — ACETAMINOPHEN 325 MG PO TABS
650.0000 mg | ORAL_TABLET | Freq: Four times a day (QID) | ORAL | Status: DC
  Administered 2017-12-01 – 2017-12-03 (×8): 650 mg via ORAL
  Filled 2017-12-01 (×8): qty 2

## 2017-12-01 NOTE — Evaluation (Signed)
Occupational Therapy Evaluation Patient Details Name: Tara Brennan MRN: [id number] DOB: [birth year] Today's Date: 12/01/2017    History of Present Illness 67 y/o F with a PMH HTN who presented to [facility] from [facility] after a fall that occurred early this morning. Patient was walking down stairs in her home when she missed a step and fell down roughly 3 stairs, hitting her head and her R side on a wood floor. Workup negative for acute intracranial injury but significant for R rib fractures and a moderate R pneumothorax; pt also with R great toe fx   Clinical Impression   This 67 y/o female presents with the above. At baseline pt is independent with ADLS, iADLs, and functional mobility, stays active. Pt demonstrating functional mobility, seated and standing UB/LB ADLs without AD and with supervision-minguard throughout. Pt will return home with spouse who is able to provide ADL assist if needed. Education provided on safety during ADLs and functional mobility at home with pt verbalizing understanding. Questions answered throughout. No further acute OT needs identified at this time, will sign off.     Follow Up Recommendations  No OT follow up;Supervision - Intermittent    Equipment Recommendations  None recommended by OT           Precautions / Restrictions Precautions Precautions: None Restrictions Weight Bearing Restrictions: No      Mobility Bed Mobility Overal bed mobility: Needs Assistance Bed Mobility: Sit to Supine       Sit to supine: Min guard   General bed mobility comments: for safety, lines   Transfers Overall transfer level: Needs assistance   Transfers: Sit to/from Stand Sit to Stand: Supervision         General transfer comment: for safety, line management     Balance Overall balance assessment: Mild deficits observed, not formally tested                                         ADL either performed or assessed with  clinical judgement   ADL Overall ADL's : At baseline                                       General ADL Comments: pt completing functional mobility without AD and supervision, demonstrating LB dressing, toileting, and standing grooming ADLs with supervision-minguard throughout; educated on safety during tub transfers and to have spouse provide supervision initially for increased safety      Vision Baseline Vision/History: Wears glasses Wears Glasses: Reading only                  Pertinent Vitals/Pain Pain Assessment: 0-10 Pain Score: 2  Pain Location: back at ribcage site  Pain Descriptors / Indicators: Guarding Pain Intervention(s): Monitored during session     Hand Dominance Right   Extremity/Trunk Assessment Upper Extremity Assessment Upper Extremity Assessment: RUE deficits/detail RUE Deficits / Details: slightly limited AROM due to pain, otherwise WFL  RUE: Unable to fully assess due to pain   Lower Extremity Assessment Lower Extremity Assessment: Defer to PT evaluation   Cervical / Trunk Assessment Cervical / Trunk Assessment: Normal   Communication     Cognition Arousal/Alertness: Awake/alert Behavior During Therapy: WFL for tasks assessed/performed Overall Cognitive Status: Within Functional Limits for tasks assessed  Home Living Family/patient expects to be discharged to:: Private residence Living Arrangements: Spouse/significant other Available Help at Discharge: Family;Available 24 hours/day Type of Home: House       Home Layout: Two level Alternate Level Stairs-Number of Steps: flight (2, 6, and then 3) Alternate Level Stairs-Rails: None Bathroom Shower/Tub: Tub/shower unit;Curtain   Bathroom Toilet: Handicapped height     Home Equipment: Grab bars - toilet          Prior Functioning/Environment Level of Independence: Independent         Comments: watches her grandchildren, drives, completes iADLs        OT Problem List: Impaired balance (sitting and/or standing);Pain;Decreased activity tolerance;Impaired UE functional use            OT Goals(Current goals can be found in the care plan section) Acute Rehab OT Goals Patient Stated Goal: return home, get back to watching her grandchildren  OT Goal Formulation: All assessment and education complete, DC therapy                   Co-evaluation PT/OT/SLP Co-Evaluation/Treatment: Yes Reason for Co-Treatment: To address functional/ADL transfers   OT goals addressed during session: ADL's and self-care      AM-PAC PT "6 Clicks" Daily Activity     Outcome Measure Help from another person eating meals?: None Help from another person taking care of personal grooming?: None Help from another person toileting, which includes using toliet, bedpan, or urinal?: None Help from another person bathing (including washing, rinsing, drying)?: None Help from another person to put on and taking off regular upper body clothing?: None Help from another person to put on and taking off regular lower body clothing?: None 6 Click Score: 24   End of Session Nurse Communication: Mobility status  Activity Tolerance: Patient tolerated treatment well Patient left: in bed;with call bell/phone within reach;with family/visitor present  OT Visit Diagnosis: Other abnormalities of gait and mobility (R26.89)                Time: 1610-9604 OT Time Calculation (min): 30 min Charges:  OT General Charges $OT Visit: 1 Visit OT Evaluation $OT Eval Moderate Complexity: 1 Mod G-Codes:     Tara Brennan, OT Pager [phone] 12/01/2017   Tara Brennan 12/01/2017, 4:58 PM

## 2017-12-01 NOTE — Evaluation (Signed)
Physical Therapy Evaluation Patient Details Name: Tara Brennan MRN: [id number] DOB: [birth year] Today's Date: 12/01/2017   History of Present Illness  67 y/o F with a PMH HTN who presented to [facility] from [facility] after a fall that occurred early this morning. Patient was walking down stairs in her home when she missed a step and fell down roughly 3 stairs, hitting her head and her R side on a wood floor. Workup negative for acute intracranial injury but significant for R rib fractures and a moderate R pneumothorax; pt also with R great toe fx  Clinical Impression  Pt is close to baseline functioning and should be safe at home with husband's assist. There are no further acute PT needs.  Will sign off at this time.     Follow Up Recommendations No PT follow up    Equipment Recommendations  None recommended by PT    Recommendations for Other Services       Precautions / Restrictions Precautions Precautions: None Restrictions Weight Bearing Restrictions: No      Mobility  Bed Mobility Overal bed mobility: Needs Assistance Bed Mobility: Sit to Supine       Sit to supine: Supervision   General bed mobility comments: for safety, lines   Transfers Overall transfer level: Needs assistance   Transfers: Sit to/from Stand Sit to Stand: Supervision         General transfer comment: for safety, line management   Ambulation/Gait Ambulation/Gait assistance: Supervision Ambulation Distance (Feet): 200 Feet Assistive device: None Gait Pattern/deviations: Step-through pattern Gait velocity: slower due to fractured great toe   General Gait Details: shorter steps to decrease flexion of great toe fracture.  Steady and safe.  Stairs Stairs: Yes Stairs assistance: Modified independent (Device/Increase time) Stair Management: One rail Right;Step to pattern;Forwards Number of Stairs: 3 General stair comments: safe with rail  Wheelchair Mobility    Modified  Rankin (Stroke Patients Only)       Balance Overall balance assessment: Mild deficits observed, not formally tested                                           Pertinent Vitals/Pain Pain Assessment: 0-10 Pain Score: 2  Pain Location: back at ribcage site  Pain Descriptors / Indicators: Guarding Pain Intervention(s): Monitored during session    Home Living Family/patient expects to be discharged to:: Private residence Living Arrangements: Spouse/significant other Available Help at Discharge: Family;Available 24 hours/day Type of Home: House       Home Layout: Two level Home Equipment: Grab bars - toilet      Prior Function Level of Independence: Independent         Comments: watches her grandchildren, drives, completes iADLs     Hand Dominance   Dominant Hand: Right    Extremity/Trunk Assessment   Upper Extremity Assessment Upper Extremity Assessment: RUE deficits/detail RUE Deficits / Details: slightly limited AROM due to pain, otherwise WFL  RUE: Unable to fully assess due to pain    Lower Extremity Assessment Lower Extremity Assessment: Overall WFL for tasks assessed    Cervical / Trunk Assessment Cervical / Trunk Assessment: Normal  Communication      Cognition Arousal/Alertness: Awake/alert Behavior During Therapy: WFL for tasks assessed/performed Overall Cognitive Status: Within Functional Limits for tasks assessed  General Comments      Exercises     Assessment/Plan    PT Assessment Patent does not need any further PT services  PT Problem List         PT Treatment Interventions      PT Goals (Current goals can be found in the Care Plan section)  Acute Rehab PT Goals Patient Stated Goal: return home, get back to watching her grandchildren  PT Goal Formulation: All assessment and education complete, DC therapy    Frequency     Barriers to discharge         Co-evaluation PT/OT/SLP Co-Evaluation/Treatment: Yes Reason for Co-Treatment: To address functional/ADL transfers PT goals addressed during session: Mobility/safety with mobility OT goals addressed during session: ADL's and self-care       AM-PAC PT "6 Clicks" Daily Activity  Outcome Measure Difficulty turning over in bed (including adjusting bedclothes, sheets and blankets)?: A Little Difficulty moving from lying on back to sitting on the side of the bed? : A Little Difficulty sitting down on and standing up from a chair with arms (e.g., wheelchair, bedside commode, etc,.)?: Unable Help needed moving to and from a bed to chair (including a wheelchair)?: A Little Help needed walking in hospital room?: A Little Help needed climbing 3-5 steps with a railing? : None 6 Click Score: 17    End of Session   Activity Tolerance: Patient tolerated treatment well Patient left: in bed;with call bell/phone within reach;with family/visitor present Nurse Communication: Mobility status PT Visit Diagnosis: Other abnormalities of gait and mobility (R26.89)    Time: 2563-8937 PT Time Calculation (min) (ACUTE ONLY): 30 min   Charges:   PT Evaluation $PT Eval Low Complexity: 1 Low     PT G Codes:        2017/12/31  Donnella Sham, PT [phone]  (pager)  Tessie Fass Sreenidhi Ganson Dec 31, 2017, 5:45 PM

## 2017-12-01 NOTE — Progress Notes (Signed)
[facility] Progress Note     Subjective: CC:  Sore around CT site; pain over right posterior ribs. Denies fever/chills/nausea/vomiting. Pulling 1,000cc on IS.   Objective: Vital signs in last 24 hours: Temp:  [98 F (36.7 C)-98.5 F (36.9 C)] 98.5 F (36.9 C) (04/26 0743) Pulse Rate:  [70-92] 81 (04/26 0743) Resp:  [13-25] 18 (04/26 0743) BP: (113-154)/(60-125) 129/83 (04/26 1015) SpO2:  [94 %-100 %] 94 % (04/26 0743) Weight:  [82 kg (180 lb 12.4 oz)] 82 kg (180 lb 12.4 oz) (04/25 2252) Last BM Date: 11/29/17  Intake/Output from previous day: No intake/output data recorded. Intake/Output this shift: No intake/output data recorded.  PE: Gen:  Alert, NAD, pleasant Card:  Regular rate and rhythm, pedal pulses 2+ BL Pulm: Appropriately tender posterior chest wall R,  Normal effort, clear to auscultation bilaterally Abd: Soft, non-tender, non-distended, bowel sounds present in all 4 quadrants Skin: warm and dry, no rashes  Psych: A&Ox3   Lab Results:  Recent Labs    11/30/17 0929 12/01/17 0403  WBC 14.2* 7.7  HGB 13.7 12.1  HCT 41.4 36.0  PLT 298 182   BMET Recent Labs    11/30/17 0929 12/01/17 0403  NA 140 139  K 3.7 4.2  CL 100* 104  CO2 28 26  GLUCOSE 105* 113*  BUN 21* 16  CREATININE 0.57 0.74  CALCIUM 10.5* 9.0   PT/INR Recent Labs    11/30/17 0929  LABPROT 12.6  INR 0.95   CMP     Component Value Date/Time   NA 139 12/01/2017 0403   K 4.2 12/01/2017 0403   CL 104 12/01/2017 0403   CO2 26 12/01/2017 0403   GLUCOSE 113 (H) 12/01/2017 0403   BUN 16 12/01/2017 0403   CREATININE 0.74 12/01/2017 0403   CALCIUM 9.0 12/01/2017 0403   PROT 8.1 11/30/2017 0929   ALBUMIN 4.7 11/30/2017 0929   AST 25 11/30/2017 0929   ALT 25 11/30/2017 0929   ALKPHOS 87 11/30/2017 0929   BILITOT 0.8 11/30/2017 0929   GFRNONAA >60 12/01/2017 0403   GFRAA >60 12/01/2017 0403   Lipase  No results found for: LIPASE     Studies/Results: Dg Ribs  Unilateral W/chest Right  Result Date: 11/30/2017 CLINICAL DATA:  Right upper rib pain after fall. EXAM: RIGHT RIBS AND CHEST - 3+ VIEW COMPARISON:  Chest x-ray dated October 14, 2017. FINDINGS: Acute nondisplaced fracture of the right posterior fifth rib. Acute minimally displaced fracture of the right posterior sixth rib. Moderate right pneumothorax. Trachea is midline. The heart size and mediastinal contours are within normal limits. Normal pulmonary vascularity. No consolidation or pleural effusion. IMPRESSION: 1. Acute fractures of the right posterior fifth and sixth ribs with moderate right pneumothorax. No definite tension component. Critical Value/emergent results were called by telephone at the time of interpretation on 11/30/2017 at 8:45 am to Dr. Julianne Rice , who verbally acknowledged these results. Electronically Signed   By: Titus Dubin M.D.   On: 11/30/2017 08:47   Ct Head Wo Contrast  Result Date: 11/30/2017 CLINICAL DATA:  Pain following fall EXAM: CT HEAD WITHOUT CONTRAST TECHNIQUE: Contiguous axial images were obtained from the base of the skull through the vertex without intravenous contrast. COMPARISON:  None. FINDINGS: Brain: The ventricles are normal in size and configuration. There is no intracranial mass hemorrhage, extra-axial fluid collection, or midline shift. Gray-white compartments appear normal. No evident acute infarct. Vascular: There is no appreciable hyperdense vessel. There is slight calcification in  the carotid siphon regions. Skull: The bony calvarium appears intact. There is a right parietal scalp hematoma. Sinuses/Orbits: There is mucosal thickening in multiple ethmoid air cells. Other visualized paranasal sinuses are clear. Orbits appear symmetric bilaterally. Other: The mastoid air cells are clear. IMPRESSION: No intracranial mass or hemorrhage. Gray-white compartments appear normal. There is a right parietal scalp hematoma without underlying fracture. There is  mucosal thickening in several ethmoid air cells. Electronically Signed   By: Lowella Grip III M.D.   On: 11/30/2017 08:24   Dg Chest Port 1 View  Result Date: 12/01/2017 CLINICAL DATA:  Right-sided pneumothorax.  Chest tube. EXAM: PORTABLE CHEST 1 VIEW COMPARISON:  11/30/2017. FINDINGS: Right chest tube again noted. Small right pneumothorax again noted. Slight interim improvement. Mild bibasilar atelectasis. Tiny bilateral pleural effusions cannot be excluded. Heart size normal. Mild right chest wall subcutaneous emphysema. Right posterior rib fractures unchanged. IMPRESSION: Right chest tube noted over the right chest. Small right-sided pneumothorax again noted. Slight improvement from prior exam. Mild right chest wall subcutaneous emphysema. Right posterior rib fractures unchanged. 2. Mild bibasilar atelectasis. Small bilateral pleural effusions cannot be excluded. Electronically Signed   By: Marcello Moores  Register   On: 12/01/2017 09:15   Dg Chest Port 1 View  Result Date: 11/30/2017 CLINICAL DATA:  Right-sided chest tube placement for pneumothorax. EXAM: PORTABLE CHEST 1 VIEW COMPARISON:  Earlier same day FINDINGS: Small bore right thoracostomy tube has been placed. Right pneumothorax is smaller, estimated at 10-15%. Mild basilar atelectasis persists. Heart size is normal. Fracture of the right posterior fifth and sixth ribs. IMPRESSION: Right thoracostomy tube in place. Pneumothorax is smaller, now 10-15%. Electronically Signed   By: Nelson Chimes M.D.   On: 11/30/2017 12:42   Dg Foot Complete Right  Result Date: 11/30/2017 CLINICAL DATA:  [place] down stairs today.  Foot pain. EXAM: RIGHT FOOT COMPLETE - 3+ VIEW COMPARISON:  04/04/2013.  02/10/2006. FINDINGS: Acute fracture at the proximal medial corner of the proximal phalanx of the great toe. Fracture does extend to the articular surface. No significant displacement. No other acute finding. Old healed fracture of the proximal phalanx of the small toe.  IMPRESSION: Acute fracture of the proximal medial corner of the proximal phalanx of the great toe. Electronically Signed   By: Nelson Chimes M.D.   On: 11/30/2017 12:56    Anti-infectives: Anti-infectives (From admission, onward)   None     Assessment/Plan Fall down 3 stairs R parietal scalp hematoma - CT head negative; hgb/hct WNL Right Rib 5-6 FX  R PTX - s/p insertion pigtail chest tube 4/45, continue -20 cm suction, CXR in AM Fracture R great toe proximal phalanx - WBAT post-op shoe  HTN - continue home Norvasc  FEN: Regular ID: none VTE: SCD's, Lovenox    Dispo: ambulate, CT to suction    LOS: 1 day    Jill Alexanders , [facility] 12/01/2017, 11:00 AM Pager: [phone] Consults: [phone] Mon-Fri 7:00 am-4:30 pm Sat-Sun 7:00 am-11:30 am

## 2017-12-01 NOTE — Care Management Note (Signed)
Case Management Note  Patient Details  Name: KAMPBELL HOLAWAY MRN: [id number] Date of Birth: 1951-04-23  Subjective/Objective:   Pt admitted on 11/30/17 s/p fall with R rib fractures and a moderate R pneumothorax; pt also with R great toe fx.  PTA, pt independent, lives at home with spouse.                  Action/Plan: PT eval pending; OT recommending no OP follow up.  Will follow for dc needs as pt progresses.  Expected Discharge Date:                  Expected Discharge Plan:  Home/Self Care  In-House Referral:  Clinical Social Work  Discharge planning Services  CM Consult  Post Acute Care Choice:    Choice offered to:     DME Arranged:    DME Agency:     HH Arranged:    HH Agency:     Status of Service:  In process, will continue to follow  If discussed at Long Length of Stay Meetings, dates discussed:    Additional Comments:  Reinaldo Raddle, RN, BSN  Trauma/Neuro ICU Case Manager [phone]

## 2017-12-02 ENCOUNTER — Inpatient Hospital Stay (HOSPITAL_COMMUNITY): Payer: PPO

## 2017-12-02 NOTE — Progress Notes (Signed)
[facility] Progress Note     Subjective: CC: pneumothorax Patient denies SOB, pain well controlled. Pulling between 1750 and 2000 on IS. Tolerating diet, having bowel function. UOP good. VSS.   Objective: Vital signs in last 24 hours: Temp:  [97.7 F (36.5 C)-98.5 F (36.9 C)] 97.7 F (36.5 C) (04/27 0338) Pulse Rate:  [65-81] 65 (04/27 0400) Resp:  [11-20] 11 (04/27 0400) BP: (107-134)/(56-83) 107/56 (04/27 0400) SpO2:  [94 %-97 %] 94 % (04/27 0400) Last BM Date: 11/29/17  Intake/Output from previous day: 04/26 0701 - 04/27 0700 In: 2076.7 [P.O.:840; I.V.:1236.7] Out: 10 [Chest Tube:10] Intake/Output this shift: No intake/output data recorded.  PE: Gen:  Alert, NAD, pleasant Card:  Regular rate and rhythm, pedal pulses 2+ BL Pulm:  Normal effort, clear to auscultation bilaterally, chest tube present with minimal serous drainage, no air leak  Abd: Soft, non-tender, non-distended, bowel sounds present in all 4 quadrants, no HSM Skin: warm and dry, no rashes  Psych: A&Ox3   Lab Results:  Recent Labs    11/30/17 0929 12/01/17 0403  WBC 14.2* 7.7  HGB 13.7 12.1  HCT 41.4 36.0  PLT 298 182   BMET Recent Labs    11/30/17 0929 12/01/17 0403  NA 140 139  K 3.7 4.2  CL 100* 104  CO2 28 26  GLUCOSE 105* 113*  BUN 21* 16  CREATININE 0.57 0.74  CALCIUM 10.5* 9.0   PT/INR Recent Labs    11/30/17 0929  LABPROT 12.6  INR 0.95   CMP     Component Value Date/Time   NA 139 12/01/2017 0403   K 4.2 12/01/2017 0403   CL 104 12/01/2017 0403   CO2 26 12/01/2017 0403   GLUCOSE 113 (H) 12/01/2017 0403   BUN 16 12/01/2017 0403   CREATININE 0.74 12/01/2017 0403   CALCIUM 9.0 12/01/2017 0403   PROT 8.1 11/30/2017 0929   ALBUMIN 4.7 11/30/2017 0929   AST 25 11/30/2017 0929   ALT 25 11/30/2017 0929   ALKPHOS 87 11/30/2017 0929   BILITOT 0.8 11/30/2017 0929   GFRNONAA >60 12/01/2017 0403   GFRAA >60 12/01/2017 0403   Lipase  No results found for:  LIPASE     Studies/Results: Dg Ribs Unilateral W/chest Right  Result Date: 11/30/2017 CLINICAL DATA:  Right upper rib pain after fall. EXAM: RIGHT RIBS AND CHEST - 3+ VIEW COMPARISON:  Chest x-ray dated October 14, 2017. FINDINGS: Acute nondisplaced fracture of the right posterior fifth rib. Acute minimally displaced fracture of the right posterior sixth rib. Moderate right pneumothorax. Trachea is midline. The heart size and mediastinal contours are within normal limits. Normal pulmonary vascularity. No consolidation or pleural effusion. IMPRESSION: 1. Acute fractures of the right posterior fifth and sixth ribs with moderate right pneumothorax. No definite tension component. Critical Value/emergent results were called by telephone at the time of interpretation on 11/30/2017 at 8:45 am to Dr. Julianne Rice , who verbally acknowledged these results. Electronically Signed   By: Titus Dubin M.D.   On: 11/30/2017 08:47   Ct Head Wo Contrast  Result Date: 11/30/2017 CLINICAL DATA:  Pain following fall EXAM: CT HEAD WITHOUT CONTRAST TECHNIQUE: Contiguous axial images were obtained from the base of the skull through the vertex without intravenous contrast. COMPARISON:  None. FINDINGS: Brain: The ventricles are normal in size and configuration. There is no intracranial mass hemorrhage, extra-axial fluid collection, or midline shift. Gray-white compartments appear normal. No evident acute infarct. Vascular: There is no appreciable hyperdense  vessel. There is slight calcification in the carotid siphon regions. Skull: The bony calvarium appears intact. There is a right parietal scalp hematoma. Sinuses/Orbits: There is mucosal thickening in multiple ethmoid air cells. Other visualized paranasal sinuses are clear. Orbits appear symmetric bilaterally. Other: The mastoid air cells are clear. IMPRESSION: No intracranial mass or hemorrhage. Gray-white compartments appear normal. There is a right parietal scalp hematoma  without underlying fracture. There is mucosal thickening in several ethmoid air cells. Electronically Signed   By: Lowella Grip III M.D.   On: 11/30/2017 08:24   Dg Chest Port 1 View  Result Date: 12/01/2017 CLINICAL DATA:  Right-sided pneumothorax.  Chest tube. EXAM: PORTABLE CHEST 1 VIEW COMPARISON:  11/30/2017. FINDINGS: Right chest tube again noted. Small right pneumothorax again noted. Slight interim improvement. Mild bibasilar atelectasis. Tiny bilateral pleural effusions cannot be excluded. Heart size normal. Mild right chest wall subcutaneous emphysema. Right posterior rib fractures unchanged. IMPRESSION: Right chest tube noted over the right chest. Small right-sided pneumothorax again noted. Slight improvement from prior exam. Mild right chest wall subcutaneous emphysema. Right posterior rib fractures unchanged. 2. Mild bibasilar atelectasis. Small bilateral pleural effusions cannot be excluded. Electronically Signed   By: Marcello Moores  Register   On: 12/01/2017 09:15   Dg Chest Port 1 View  Result Date: 11/30/2017 CLINICAL DATA:  Right-sided chest tube placement for pneumothorax. EXAM: PORTABLE CHEST 1 VIEW COMPARISON:  Earlier same day FINDINGS: Small bore right thoracostomy tube has been placed. Right pneumothorax is smaller, estimated at 10-15%. Mild basilar atelectasis persists. Heart size is normal. Fracture of the right posterior fifth and sixth ribs. IMPRESSION: Right thoracostomy tube in place. Pneumothorax is smaller, now 10-15%. Electronically Signed   By: Nelson Chimes M.D.   On: 11/30/2017 12:42   Dg Foot Complete Right  Result Date: 11/30/2017 CLINICAL DATA:  [place] [address] down stairs today.  Foot pain. EXAM: RIGHT FOOT COMPLETE - 3+ VIEW COMPARISON:  04/04/2013.  02/10/2006. FINDINGS: Acute fracture at the proximal medial corner of the proximal phalanx of the great toe. Fracture does extend to the articular surface. No significant displacement. No other acute finding. Old healed fracture of  the proximal phalanx of the small toe. IMPRESSION: Acute fracture of the proximal medial corner of the proximal phalanx of the great toe. Electronically Signed   By: Nelson Chimes M.D.   On: 11/30/2017 12:56    Anti-infectives: Anti-infectives (From admission, onward)   None       Assessment/Plan Fall down 3 stairs R parietal scalp hematoma - CT head negative; hgb/hct WNL Right Rib 5-6 FX  R PTX - s/p insertion pigtail chest tube 4/45, continue -20 cm suction, CXRpending Fracture R great toe proximal phalanx - WBAT post-op shoe  HTN - continue home Norvasc   FEN: Regular ID: none VTE: SCD's, Lovenox  Dispo: ambulate, CXR pending - may be able to put CT to water seal    LOS: 2 days    Brigid Re , [facility] 12/02/2017, 7:35 AM Pager: [phone] Trauma Pager: [phone] Mon-Fri 7:00 am-4:30 pm Sat-Sun 7:00 am-11:30 am

## 2017-12-02 NOTE — Clinical Social Work Note (Signed)
Clinical Social Worker met with patient and patient spouse at bedside to offer support and discuss patient needs at discharge.  Patient states that she was at home upstairs in her office and upon coming downstairs to living the area fell down three steps to a hardwood landing.  Patient lives at home with her husband and plans to return home at discharge.  Patient is anxious to get back to baseline so she can keep her two young grandchildren.  Clinical Social Worker inquired about possible substance use.  Patient states that she does not drink any alcohol or use any type of drugs.  SBIRT completed and no resources needed.  Patient spouse plans to provide patient with transportation at discharge.  Clinical Social Worker will sign off for now as social work intervention is no longer needed. Please consult [place] again if new need arises.  Barbette Or, [place]

## 2017-12-03 ENCOUNTER — Inpatient Hospital Stay (HOSPITAL_COMMUNITY): Payer: PPO

## 2017-12-03 MED ORDER — OXYCODONE HCL 5 MG PO TABS: 5.0000 mg | ORAL_TABLET | Freq: Four times a day (QID) | ORAL | 0 refills | Status: DC | PRN

## 2017-12-03 MED ORDER — ACETAMINOPHEN 325 MG PO TABS: 650.0000 mg | ORAL_TABLET | Freq: Four times a day (QID) | ORAL | Status: DC

## 2017-12-03 NOTE — Progress Notes (Signed)
Discharge teaching complete. Meds, diet, activity, follow up appointments and incision care reviewed and all questions answered. Copy of instructions given to patient and prescription sent to pharmacy.

## 2017-12-03 NOTE — Plan of Care (Signed)
  Problem: Health Behavior/Discharge Planning: Goal: Ability to manage health-related needs will improve Outcome: Adequate for Discharge   Problem: Clinical Measurements: Goal: Ability to maintain clinical measurements within normal limits will improve Outcome: Adequate for Discharge Goal: Will remain free from infection Outcome: Adequate for Discharge Goal: Diagnostic test results will improve Outcome: Adequate for Discharge Goal: Respiratory complications will improve Outcome: Adequate for Discharge Goal: Cardiovascular complication will be avoided Outcome: Adequate for Discharge   Problem: Elimination: Goal: Will not experience complications related to bowel motility Outcome: Adequate for Discharge Goal: Will not experience complications related to urinary retention Outcome: Adequate for Discharge   Problem: Skin Integrity: Goal: Risk for impaired skin integrity will decrease Outcome: Adequate for Discharge   Problem: Respiratory: Goal: Ability to maintain adequate ventilation will improve Outcome: Adequate for Discharge   Problem: Skin Integrity: Goal: Ability to maintain adequate tissue integrity will improve Outcome: Adequate for Discharge   Problem: Urinary Elimination: Goal: Ability to achieve a regular elimination pattern will improve Outcome: Adequate for Discharge   Problem: Coping: Goal: Exhibits appropriate coping mechanism and reduced anxiety resulting from physical and emotional stress Outcome: Adequate for Discharge

## 2017-12-03 NOTE — Progress Notes (Signed)
[facility] Progress Note     Subjective: CC: R PTX Patient denies SOB, palpitations. Pain well controlled. Hoping to be able to go home today. Awaiting CXR results VSS.   Objective: Vital signs in last 24 hours: Temp:  [97.8 F (36.6 C)-98.2 F (36.8 C)] 97.8 F (36.6 C) (04/28 0300) Pulse Rate:  [60-79] 73 (04/27 2000) Resp:  [12-23] 23 (04/27 2000) BP: (111-132)/(59-76) 128/64 (04/27 2000) SpO2:  [95 %-100 %] 95 % (04/27 2000) Last BM Date: 11/29/17  Intake/Output from previous day: 04/27 0701 - 04/28 0700 In: 1647.5 [P.O.:960; I.V.:687.5] Out: 20 [Chest Tube:20] Intake/Output this shift: No intake/output data recorded.  PE: Gen:  Alert, NAD, pleasant Card:  Regular rate and rhythm, pedal pulses 2+ BL Pulm:  Normal effort, clear to auscultation bilaterally, chest tube present with minimal serous drainage, no air leak  Abd: Soft, non-tender, non-distended, bowel sounds present in all 4 quadrants, no HSM Skin: warm and dry, no rashes  Psych: A&Ox3     Lab Results:  Recent Labs    11/30/17 0929 12/01/17 0403  WBC 14.2* 7.7  HGB 13.7 12.1  HCT 41.4 36.0  PLT 298 182   BMET Recent Labs    11/30/17 0929 12/01/17 0403  NA 140 139  K 3.7 4.2  CL 100* 104  CO2 28 26  GLUCOSE 105* 113*  BUN 21* 16  CREATININE 0.57 0.74  CALCIUM 10.5* 9.0   PT/INR Recent Labs    11/30/17 0929  LABPROT 12.6  INR 0.95   CMP     Component Value Date/Time   NA 139 12/01/2017 0403   K 4.2 12/01/2017 0403   CL 104 12/01/2017 0403   CO2 26 12/01/2017 0403   GLUCOSE 113 (H) 12/01/2017 0403   BUN 16 12/01/2017 0403   CREATININE 0.74 12/01/2017 0403   CALCIUM 9.0 12/01/2017 0403   PROT 8.1 11/30/2017 0929   ALBUMIN 4.7 11/30/2017 0929   AST 25 11/30/2017 0929   ALT 25 11/30/2017 0929   ALKPHOS 87 11/30/2017 0929   BILITOT 0.8 11/30/2017 0929   GFRNONAA >60 12/01/2017 0403   GFRAA >60 12/01/2017 0403   Lipase  No results found for:  LIPASE     Studies/Results: Dg Chest Port 1 View  Result Date: 12/02/2017 CLINICAL DATA:  Pneumothorax EXAM: PORTABLE CHEST 1 VIEW COMPARISON:  12/01/2017 FINDINGS: Stable right chest tube. Right apical pneumothorax improved. Bibasilar atelectasis improved. Cardiomegaly. IMPRESSION: Improved right apical pneumothorax.  Stable right chest tube. Electronically Signed   By: Marybelle Killings M.D.   On: 12/02/2017 12:45    Anti-infectives: Anti-infectives (From admission, onward)   None       Assessment/Plan Fall down 3 stairs R parietal scalp hematoma- CT head negative; hgb/hct WNL Right Rib 5-6 FX  R PTX- s/p insertion pigtail chest tube 4/45,CT to WS yesterday - CXR this AM stable, d/c chest tube Fracture R great toe proximal phalanx- WBAT post-op shoe  HTN - continue home Norvasc   FEN: Regular ID: none VTE: SCD's, Lovenox  Dispo: CXR stable, d/c chest tube and repeat CXR in 4h - if stable can discharge home    LOS: 3 days    Brigid Re , [facility] 12/03/2017, 7:44 AM Pager: [phone] Trauma Pager: [phone] Mon-Fri 7:00 am-4:30 pm Sat-Sun 7:00 am-11:30 am

## 2017-12-03 NOTE — Discharge Summary (Signed)
Physician Discharge Summary  Patient ID: Tara Brennan MRN: [id number] DOB/AGE: [birth year] 68 y.o.  Admit date: 11/30/2017 Discharge date: 12/03/2017 Discharge Diagnoses Fall  Right rib fractures Right pneumothorax R great toe fracture  Consultants Orthopedic surgery  Procedures Right chest tube placement - 11/30/17 Dr. Doylene Canard Course: Patient is a 67 y/o F with a PMH HTN who presented to [facility] from [facility] after a fall that occurred early this morning. Patient was walking down stairs in her home when she missed a step and fell down roughly 3 stairs, hitting her head and her R side on a wood floor. Denies LOC. Ambulatory after the event. Decided to go to the ED due to a knot on her head. Workup negative for acute intracranial injury but significant for R rib fractures and a moderate R pneumothorax so she was transferred to the [facility] trauma center for further treatment. Currently c/o R posterior chest wall pain that radiates through to her anterior chest wall, worse with movement. Also c/o R great toe pain. Denies neck or abdominal pain. Denies nausea or vomiting. Denies use of blood thinning medications. Right chest tube placed for pneumothorax. Orthopedic surgery consulted for right toe fracture and recommended WBAT in post-op shoe. OT/PT evaluated patient and recommended no follow up needed. Chest tube was removed 4/28 and follow up CXR remained stable. Patient was discharged to home in good condition and knows to follow up as below.     Allergies as of 12/03/2017   No Known Allergies     Medication List    TAKE these medications   acetaminophen 325 MG tablet Commonly known as:  TYLENOL Take 2 tablets (650 mg total) by mouth every 6 (six) hours.   alendronate 70 MG tablet Commonly known as:  FOSAMAX Take 70 mg by mouth every 7 (seven) days. Take on sundays  Take with a full glass of water on an empty stomach.   amLODipine 5 MG tablet Commonly known  as:  NORVASC TK 1 T PO QD   aspirin 81 MG chewable tablet Chew 81 mg by mouth at bedtime.   cholecalciferol 1000 units tablet Commonly known as:  VITAMIN D Take 1,000 Units by mouth daily.   oxyCODONE 5 MG immediate release tablet Commonly known as:  Oxy IR/ROXICODONE Take 1 tablet (5 mg total) by mouth every 6 (six) hours as needed for severe pain.        Follow-up Information    [facility] [place]. Go on 12/12/2017.   Why:  Appointment scheduled for 10:45 AM. Please arrive 30 min prior to appointment time. Bring photo ID and Environmental consultant. Contact information: [place] [id number] [place]. Go on 12/11/2017.   Why:  Go to either location the day prior to trauma clinic appointment for follow up x-ray. If they have any issue finding the order have them call the trauma clinic office.  Contact information: [phone] [address],  [address]  [address], [address]          Signed: Brigid Re , [facility] Surgery 12/03/2017, 10:06 AM Pager: [phone] Trauma: [phone] Mon-Fri 7:00 am-4:30 pm Sat-Sun 7:00 am-11:30 am

## 2017-12-11 ENCOUNTER — Other Ambulatory Visit: Payer: Self-pay | Admitting: Physician Assistant

## 2017-12-11 ENCOUNTER — Ambulatory Visit
Admission: RE | Admit: 2017-12-11 | Discharge: 2017-12-11 | Disposition: A | Discharge status: Home / Self Care | Payer: PPO | Source: Ambulatory Visit | Attending: Physician Assistant | Admitting: Physician Assistant

## 2017-12-11 DIAGNOSIS — S2241XD Multiple fractures of ribs, right side, subsequent encounter for fracture with routine healing: Secondary | ICD-10-CM

## 2017-12-12 DIAGNOSIS — S2241XS Multiple fractures of ribs, right side, sequela: Secondary | ICD-10-CM | POA: Diagnosis not present

## 2017-12-12 DIAGNOSIS — Z09 Encounter for follow-up examination after completed treatment for conditions other than malignant neoplasm: Secondary | ICD-10-CM | POA: Diagnosis not present

## 2018-02-19 DIAGNOSIS — D2261 Melanocytic nevi of right upper limb, including shoulder: Secondary | ICD-10-CM | POA: Diagnosis not present

## 2018-02-19 DIAGNOSIS — E785 Hyperlipidemia, unspecified: Secondary | ICD-10-CM | POA: Diagnosis not present

## 2018-02-19 DIAGNOSIS — Z1283 Encounter for screening for malignant neoplasm of skin: Secondary | ICD-10-CM | POA: Diagnosis not present

## 2018-02-19 DIAGNOSIS — D225 Melanocytic nevi of trunk: Secondary | ICD-10-CM | POA: Diagnosis not present

## 2018-02-19 DIAGNOSIS — D2262 Melanocytic nevi of left upper limb, including shoulder: Secondary | ICD-10-CM | POA: Diagnosis not present

## 2018-02-19 DIAGNOSIS — M818 Other osteoporosis without current pathological fracture: Secondary | ICD-10-CM | POA: Diagnosis not present

## 2018-02-19 DIAGNOSIS — I10 Essential (primary) hypertension: Secondary | ICD-10-CM | POA: Diagnosis not present

## 2018-03-02 ENCOUNTER — Ambulatory Visit (HOSPITAL_COMMUNITY)
Admission: RE | Admit: 2018-03-02 | Discharge: 2018-03-02 | Disposition: A | Discharge status: Home / Self Care | Payer: PPO | Source: Ambulatory Visit | Attending: Pulmonary Disease | Admitting: Pulmonary Disease

## 2018-03-02 ENCOUNTER — Other Ambulatory Visit (HOSPITAL_COMMUNITY): Payer: Self-pay | Admitting: Pulmonary Disease

## 2018-03-02 DIAGNOSIS — M4854XA Collapsed vertebra, not elsewhere classified, thoracic region, initial encounter for fracture: Secondary | ICD-10-CM | POA: Diagnosis not present

## 2018-03-02 DIAGNOSIS — R079 Chest pain, unspecified: Secondary | ICD-10-CM | POA: Insufficient documentation

## 2018-03-02 DIAGNOSIS — Z8781 Personal history of (healed) traumatic fracture: Secondary | ICD-10-CM | POA: Insufficient documentation

## 2018-03-13 DIAGNOSIS — I10 Essential (primary) hypertension: Secondary | ICD-10-CM | POA: Diagnosis not present

## 2018-03-13 DIAGNOSIS — E785 Hyperlipidemia, unspecified: Secondary | ICD-10-CM | POA: Diagnosis not present

## 2018-03-13 DIAGNOSIS — M818 Other osteoporosis without current pathological fracture: Secondary | ICD-10-CM | POA: Diagnosis not present

## 2018-04-19 DIAGNOSIS — H2513 Age-related nuclear cataract, bilateral: Secondary | ICD-10-CM | POA: Diagnosis not present

## 2018-04-25 ENCOUNTER — Encounter (INDEPENDENT_AMBULATORY_CARE_PROVIDER_SITE_OTHER): Payer: Self-pay | Admitting: Orthopedic Surgery

## 2018-04-25 ENCOUNTER — Ambulatory Visit (INDEPENDENT_AMBULATORY_CARE_PROVIDER_SITE_OTHER): Payer: PPO

## 2018-04-25 ENCOUNTER — Ambulatory Visit (INDEPENDENT_AMBULATORY_CARE_PROVIDER_SITE_OTHER): Payer: PPO | Admitting: Orthopedic Surgery

## 2018-04-25 DIAGNOSIS — M79674 Pain in right toe(s): Secondary | ICD-10-CM

## 2018-04-25 NOTE — Progress Notes (Signed)
Office Visit Note   Patient: Tara Brennan           Date of Birth: [birth year]           MRN: [id number] Visit Date: 04/25/2018 Requested by: Sinda Du, [place], [place] [address] PCP: Sinda Du, MD  Subjective: Chief Complaint  Patient presents with  . Right Foot - Pain    HPI: Tara Brennan presents with right great toe pain.  She reports a fall earlier this year where she sustained pneumothorax and rib fractures.  She also injured her right great toe.  The fall occurred 11/30/2017.  Radiographs are reviewed and she had an avulsion type fracture off the base of the proximal phalanx medial aspect right great toe.  Hard for her to mow the yard.  She wants to check to make sure that she is okay.              ROS: All systems reviewed are negative as they relate to the chief complaint within the history of present illness.  Patient denies  fevers or chills.   Assessment & Plan: Visit Diagnoses:  1. Great toe pain, right     Plan: Impression is healed avulsion fracture base the proximal phalanx of the right great toe.  Range of motion excellent and symmetric compared to the left toe only missing about 10 degrees.  No intervention required at this time.  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  I will see her back as needed the toe ligaments are stable to stress at this time  Follow-Up Instructions: Return if symptoms worsen or fail to improve.   Orders:  Orders Placed This Encounter  Procedures  . XR Toe Great Right   No orders of the defined types were placed in this encounter.     Procedures: No procedures performed   Clinical Data: No additional findings.  Objective: Vital Signs: There were no vitals taken for this visit.  Physical Exam:   Constitutional: Patient appears well-developed HEENT:  Head:  Normocephalic Eyes:EOM are normal Neck: Normal range of motion Cardiovascular: Normal rate Pulmonary/chest: Effort normal Neurologic: Patient is alert Skin: Skin is warm Psychiatric: Patient has normal mood and affect    Ortho Exam: Ortho exam demonstrates full active and passive range of motion of the ankle.  The toe has about 10 degrees less dorsiflexion and plantarflexion on the right compared to the left.  Diminished IP flexion as well on the right compared to the left but flexor tendon and extensor tendons are intact.  Specialty Comments:  No specialty comments available.  Imaging: Xr Toe Great Right  Result Date: 04/25/2018 AP lateral oblique right great toe reviewed.  On the tibial aspect of the proximal phalanx prior avulsion fracture appears healed.  No significant articular step-off.  No additional bony deformities.    PMFS History: Patient Active Problem List   Diagnosis Date Noted  . Pneumothorax, right 11/30/2017  . Pain in toe 04/04/2013  . 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 OA (osteoarthritis) of knee 12/07/2011  . Knee pain 12/07/2011  . KNEE, ARTHRITIS, DEGEN./OSTEO 09/14/2009  . KNEE PAIN 09/14/2009   Past Medical History:  Diagnosis Date  . GERD (gastroesophageal reflux disease)   . Osteopenia   . Ovarian cyst     Family History  Problem Relation Age of Onset  . Hypertension  Mother   . Cancer Father   . Diabetes Sister   . Arthritis Unknown     Past Surgical History:  Procedure Laterality Date  . ABDOMINAL HYSTERECTOMY  1984  . ESOPHAGOGASTRODUODENOSCOPY  06/28/2012   Procedure: ESOPHAGOGASTRODUODENOSCOPY (EGD);  Surgeon: Jeryl Columbia, MD;  Location: [facility] ENDOSCOPY;  Service: Endoscopy;  Laterality: N/A;  . OVARIAN CYST SURGERY    . PARTIAL HYSTERECTOMY     Social History   Occupational History  . Not on file  Tobacco Use  . Smoking status: Never Smoker  . Smokeless tobacco: Never Used  Substance and Sexual Activity  . Alcohol use:  No  . Drug use: No  . Sexual activity: Yes    Birth control/protection: Surgical

## 2018-06-19 ENCOUNTER — Other Ambulatory Visit (HOSPITAL_COMMUNITY): Payer: Self-pay | Admitting: Obstetrics & Gynecology

## 2018-06-19 DIAGNOSIS — Z1231 Encounter for screening mammogram for malignant neoplasm of breast: Secondary | ICD-10-CM

## 2018-07-09 DIAGNOSIS — L728 Other follicular cysts of the skin and subcutaneous tissue: Secondary | ICD-10-CM | POA: Diagnosis not present

## 2018-07-09 DIAGNOSIS — L258 Unspecified contact dermatitis due to other agents: Secondary | ICD-10-CM | POA: Diagnosis not present

## 2018-07-27 ENCOUNTER — Ambulatory Visit (HOSPITAL_COMMUNITY)
Admission: RE | Admit: 2018-07-27 | Discharge: 2018-07-27 | Disposition: A | Discharge status: Home / Self Care | Payer: PPO | Source: Ambulatory Visit | Attending: Obstetrics & Gynecology | Admitting: Obstetrics & Gynecology

## 2018-07-27 DIAGNOSIS — Z1231 Encounter for screening mammogram for malignant neoplasm of breast: Secondary | ICD-10-CM | POA: Diagnosis not present

## 2018-08-07 DIAGNOSIS — Z01419 Encounter for gynecological examination (general) (routine) without abnormal findings: Secondary | ICD-10-CM | POA: Diagnosis not present

## 2018-08-07 DIAGNOSIS — Z6826 Body mass index (BMI) 26.0-26.9, adult: Secondary | ICD-10-CM | POA: Diagnosis not present

## 2018-09-26 ENCOUNTER — Other Ambulatory Visit (HOSPITAL_COMMUNITY): Payer: Self-pay | Admitting: Pulmonary Disease

## 2018-09-26 DIAGNOSIS — Z780 Asymptomatic menopausal state: Secondary | ICD-10-CM

## 2018-10-01 DIAGNOSIS — I10 Essential (primary) hypertension: Secondary | ICD-10-CM | POA: Diagnosis not present

## 2018-10-01 DIAGNOSIS — E785 Hyperlipidemia, unspecified: Secondary | ICD-10-CM | POA: Diagnosis not present

## 2018-10-01 DIAGNOSIS — M818 Other osteoporosis without current pathological fracture: Secondary | ICD-10-CM | POA: Diagnosis not present

## 2018-10-04 ENCOUNTER — Ambulatory Visit (HOSPITAL_COMMUNITY)
Admission: RE | Admit: 2018-10-04 | Discharge: 2018-10-04 | Disposition: A | Discharge status: Home / Self Care | Payer: PPO | Source: Ambulatory Visit | Attending: Pulmonary Disease | Admitting: Pulmonary Disease

## 2018-10-04 DIAGNOSIS — M818 Other osteoporosis without current pathological fracture: Secondary | ICD-10-CM | POA: Diagnosis not present

## 2018-10-04 DIAGNOSIS — Z780 Asymptomatic menopausal state: Secondary | ICD-10-CM | POA: Insufficient documentation

## 2018-10-05 DIAGNOSIS — E785 Hyperlipidemia, unspecified: Secondary | ICD-10-CM | POA: Diagnosis not present

## 2018-10-05 DIAGNOSIS — K210 Gastro-esophageal reflux disease with esophagitis: Secondary | ICD-10-CM | POA: Diagnosis not present

## 2018-10-05 DIAGNOSIS — I10 Essential (primary) hypertension: Secondary | ICD-10-CM | POA: Diagnosis not present

## 2018-10-05 DIAGNOSIS — M1990 Unspecified osteoarthritis, unspecified site: Secondary | ICD-10-CM | POA: Diagnosis not present

## 2018-10-15 DIAGNOSIS — N390 Urinary tract infection, site not specified: Secondary | ICD-10-CM | POA: Diagnosis not present

## 2018-10-18 ENCOUNTER — Other Ambulatory Visit: Payer: Self-pay | Admitting: *Deleted

## 2018-10-18 NOTE — Patient Outreach (Addendum)
[facility]) Care Management  10/18/2018  TYSHEKA FANGUY June 04, 1951 [id number]   Subjective: Telephone call to patient's home number, no answer, left HIPAA compliant voicemail message, and requested call back.    Objective: Per KPN (Knowledge Performance Now, point of care tool) and chart review, patient has had no recent hospitalizations or ED visits.  Patient also has a history of osteopenia and ovarian cyst.    Assessment: Received [facility] Call Center follow up referral on 10/15/2018.   Referral: Patient with blood in urine to follow up with provider or urgent care.  Screening  follow up pending patient contact.      Plan: RNCM will send unsuccessful outreach  letter, [facility] pamphlet, will call patient for 2nd telephone outreach attempt within 4 business days, screening follow up, and will proceed with case closure within 10 business days if no return call.    Michah Minton H. Annia Friendly, BSN, [place] Management [facility] Telephonic CM Phone: [phone] Fax: [phone]

## 2018-10-19 ENCOUNTER — Other Ambulatory Visit: Payer: Self-pay | Admitting: *Deleted

## 2018-10-19 NOTE — Patient Outreach (Signed)
[facility]) Care Management  10/19/2018  MARQUETTE PIONTEK August 29, 1950 [id number]   Subjective: Telephone call to patient's home number, no answer, left HIPAA compliant voicemail message, and requested call back.    Objective: Per KPN (Knowledge Performance Now, point of care tool) and chart review, patient has had no recent hospitalizations or ED visits.  Patient also has a history of osteopenia and ovarian cyst.    Assessment: Received [facility] Call Center follow up referral on 10/15/2018.   Referral: Patient with blood in urine to follow up with provider or urgent care.  Screening  follow up pending patient contact.      Plan: RNCM has sent unsuccessful outreach  letter, [facility] pamphlet, will call patient for 3rd telephone outreach attempt within 4 business days, screening follow up, and will proceed with case closure within 10 business days if no return call.    Mirage Pfefferkorn H. Annia Friendly, BSN, [place] Management [facility] Telephonic CM Phone: [phone] Fax: [phone]

## 2018-10-22 ENCOUNTER — Other Ambulatory Visit: Payer: Self-pay | Admitting: *Deleted

## 2018-10-22 NOTE — Patient Outreach (Signed)
[facility]) Care Management  10/22/2018  Tara Brennan 12-21-1950 [id number]   Subjective: Received voicemail message from Myrlene Broker, states she is returning call, and requested call back. Telephone call to patient's home number, no answer, left HIPAA compliant voicemail message, and requested call back.    Objective:Per KPN (Knowledge Performance Now, point of care tool) and chart review,patient has had no recent hospitalizations or ED visits. Patient also has a history of osteopenia and ovarian cyst.    Assessment: Received [facility] Call Center follow up referral on 10/15/2018.Referral: Patient with blood in urine to follow up with provider or urgent care.Screening follow up pending patient contact.      Plan:RNCM has sent unsuccessful outreach letter, [facility] pamphlet, and will proceed with case closure within 10 business days if no return call.    Jamiee Milholland H. Annia Friendly, BSN, [place] Management [facility] Telephonic CM Phone: [phone] Fax: [phone]

## 2018-11-01 ENCOUNTER — Other Ambulatory Visit: Payer: Self-pay | Admitting: *Deleted

## 2018-11-01 NOTE — Patient Outreach (Signed)
[facility]) Care Management  11/01/2018  Tara Brennan [birth year] [id number]   No response from patient outreach attempts will proceed with case closure.    Objective:Per KPN (Knowledge Performance Now, point of care tool) and chart review,patient has had no recent hospitalizations or ED visits. Patient also has a history of osteopenia and ovarian cyst.    Assessment: Received [facility] Call Center follow up referral on 10/15/2018.Referral: Patient with blood in urine to follow up with provider or urgent care.Screening follow up not completed due to unable to contact patient and will proceed with case closure.      Plan:Case closure due to unable to reach.     Tara Brennan H. Annia Friendly, BSN, [place] Management [facility] Telephonic CM Phone: [phone] Fax: [phone]

## 2019-03-21 DIAGNOSIS — K5730 Diverticulosis of large intestine without perforation or abscess without bleeding: Secondary | ICD-10-CM | POA: Diagnosis not present

## 2019-03-21 DIAGNOSIS — K635 Polyp of colon: Secondary | ICD-10-CM | POA: Diagnosis not present

## 2019-03-21 DIAGNOSIS — D120 Benign neoplasm of cecum: Secondary | ICD-10-CM | POA: Diagnosis not present

## 2019-03-21 DIAGNOSIS — Z8371 Family history of colonic polyps: Secondary | ICD-10-CM | POA: Diagnosis not present

## 2019-03-26 DIAGNOSIS — K635 Polyp of colon: Secondary | ICD-10-CM | POA: Diagnosis not present

## 2019-03-26 DIAGNOSIS — D120 Benign neoplasm of cecum: Secondary | ICD-10-CM | POA: Diagnosis not present

## 2019-04-05 DIAGNOSIS — R609 Edema, unspecified: Secondary | ICD-10-CM | POA: Diagnosis not present

## 2019-04-05 DIAGNOSIS — T50905A Adverse effect of unspecified drugs, medicaments and biological substances, initial encounter: Secondary | ICD-10-CM | POA: Diagnosis not present

## 2019-04-05 DIAGNOSIS — I10 Essential (primary) hypertension: Secondary | ICD-10-CM | POA: Diagnosis not present

## 2019-04-05 DIAGNOSIS — E785 Hyperlipidemia, unspecified: Secondary | ICD-10-CM | POA: Diagnosis not present

## 2019-04-19 ENCOUNTER — Other Ambulatory Visit: Payer: Self-pay

## 2019-04-19 DIAGNOSIS — R6889 Other general symptoms and signs: Secondary | ICD-10-CM | POA: Diagnosis not present

## 2019-04-19 DIAGNOSIS — Z20822 Contact with and (suspected) exposure to covid-19: Secondary | ICD-10-CM

## 2019-04-22 LAB — NOVEL CORONAVIRUS, NAA: SARS-CoV-2, NAA: NOT DETECTED

## 2019-05-24 ENCOUNTER — Other Ambulatory Visit: Payer: Self-pay

## 2019-05-24 ENCOUNTER — Other Ambulatory Visit (HOSPITAL_COMMUNITY): Payer: Self-pay | Admitting: Pulmonary Disease

## 2019-05-24 ENCOUNTER — Ambulatory Visit (HOSPITAL_COMMUNITY)
Admission: RE | Admit: 2019-05-24 | Discharge: 2019-05-24 | Disposition: A | Discharge status: Home / Self Care | Payer: PPO | Source: Ambulatory Visit | Attending: Pulmonary Disease | Admitting: Pulmonary Disease

## 2019-05-24 DIAGNOSIS — J301 Allergic rhinitis due to pollen: Secondary | ICD-10-CM | POA: Diagnosis not present

## 2019-05-24 DIAGNOSIS — M79672 Pain in left foot: Secondary | ICD-10-CM | POA: Insufficient documentation

## 2019-05-24 DIAGNOSIS — M19072 Primary osteoarthritis, left ankle and foot: Secondary | ICD-10-CM | POA: Diagnosis not present

## 2019-05-24 DIAGNOSIS — M810 Age-related osteoporosis without current pathological fracture: Secondary | ICD-10-CM | POA: Diagnosis not present

## 2019-05-24 DIAGNOSIS — I10 Essential (primary) hypertension: Secondary | ICD-10-CM | POA: Diagnosis not present

## 2019-05-24 DIAGNOSIS — M79673 Pain in unspecified foot: Secondary | ICD-10-CM | POA: Diagnosis not present

## 2019-06-22 ENCOUNTER — Encounter (HOSPITAL_COMMUNITY): Payer: Self-pay | Admitting: Emergency Medicine

## 2019-06-22 ENCOUNTER — Other Ambulatory Visit: Payer: Self-pay

## 2019-06-22 ENCOUNTER — Inpatient Hospital Stay (HOSPITAL_COMMUNITY)
Admission: EM | Admit: 2019-06-22 | Discharge: 2019-06-24 | DRG: 310 | Disposition: A | Discharge status: Home / Self Care | Payer: PPO | Attending: Pulmonary Disease | Admitting: Pulmonary Disease

## 2019-06-22 DIAGNOSIS — Z03818 Encounter for observation for suspected exposure to other biological agents ruled out: Secondary | ICD-10-CM | POA: Diagnosis not present

## 2019-06-22 DIAGNOSIS — R509 Fever, unspecified: Secondary | ICD-10-CM | POA: Diagnosis not present

## 2019-06-22 DIAGNOSIS — M1990 Unspecified osteoarthritis, unspecified site: Secondary | ICD-10-CM | POA: Diagnosis present

## 2019-06-22 DIAGNOSIS — Z20828 Contact with and (suspected) exposure to other viral communicable diseases: Secondary | ICD-10-CM | POA: Diagnosis present

## 2019-06-22 DIAGNOSIS — I4891 Unspecified atrial fibrillation: Secondary | ICD-10-CM | POA: Diagnosis not present

## 2019-06-22 DIAGNOSIS — Z833 Family history of diabetes mellitus: Secondary | ICD-10-CM | POA: Diagnosis not present

## 2019-06-22 DIAGNOSIS — K219 Gastro-esophageal reflux disease without esophagitis: Secondary | ICD-10-CM | POA: Diagnosis present

## 2019-06-22 DIAGNOSIS — M810 Age-related osteoporosis without current pathological fracture: Secondary | ICD-10-CM | POA: Diagnosis present

## 2019-06-22 DIAGNOSIS — I4892 Unspecified atrial flutter: Secondary | ICD-10-CM

## 2019-06-22 DIAGNOSIS — Z7983 Long term (current) use of bisphosphonates: Secondary | ICD-10-CM | POA: Diagnosis not present

## 2019-06-22 DIAGNOSIS — I10 Essential (primary) hypertension: Secondary | ICD-10-CM | POA: Diagnosis not present

## 2019-06-22 DIAGNOSIS — Z7982 Long term (current) use of aspirin: Secondary | ICD-10-CM

## 2019-06-22 DIAGNOSIS — I340 Nonrheumatic mitral (valve) insufficiency: Secondary | ICD-10-CM | POA: Diagnosis not present

## 2019-06-22 DIAGNOSIS — Z7189 Other specified counseling: Secondary | ICD-10-CM | POA: Diagnosis not present

## 2019-06-22 DIAGNOSIS — Z8249 Family history of ischemic heart disease and other diseases of the circulatory system: Secondary | ICD-10-CM | POA: Diagnosis not present

## 2019-06-22 DIAGNOSIS — I482 Chronic atrial fibrillation, unspecified: Secondary | ICD-10-CM | POA: Diagnosis not present

## 2019-06-22 HISTORY — DX: Anxiety disorder, unspecified: F41.9

## 2019-06-22 NOTE — ED Notes (Addendum)
ekg given to dr knapp 

## 2019-06-22 NOTE — ED Triage Notes (Signed)
Pt states she has been having a "fast heart rate" since 8:30pm while she was chasing a cricket in her house.

## 2019-06-23 ENCOUNTER — Inpatient Hospital Stay (HOSPITAL_COMMUNITY): Payer: PPO

## 2019-06-23 DIAGNOSIS — Z7983 Long term (current) use of bisphosphonates: Secondary | ICD-10-CM | POA: Diagnosis not present

## 2019-06-23 DIAGNOSIS — K219 Gastro-esophageal reflux disease without esophagitis: Secondary | ICD-10-CM | POA: Diagnosis present

## 2019-06-23 DIAGNOSIS — Z833 Family history of diabetes mellitus: Secondary | ICD-10-CM | POA: Diagnosis not present

## 2019-06-23 DIAGNOSIS — I4891 Unspecified atrial fibrillation: Secondary | ICD-10-CM | POA: Diagnosis present

## 2019-06-23 DIAGNOSIS — I4892 Unspecified atrial flutter: Secondary | ICD-10-CM | POA: Diagnosis present

## 2019-06-23 DIAGNOSIS — I10 Essential (primary) hypertension: Secondary | ICD-10-CM | POA: Diagnosis present

## 2019-06-23 DIAGNOSIS — Z7982 Long term (current) use of aspirin: Secondary | ICD-10-CM | POA: Diagnosis not present

## 2019-06-23 DIAGNOSIS — Z20828 Contact with and (suspected) exposure to other viral communicable diseases: Secondary | ICD-10-CM | POA: Diagnosis present

## 2019-06-23 DIAGNOSIS — Z8249 Family history of ischemic heart disease and other diseases of the circulatory system: Secondary | ICD-10-CM | POA: Diagnosis not present

## 2019-06-23 DIAGNOSIS — M810 Age-related osteoporosis without current pathological fracture: Secondary | ICD-10-CM | POA: Diagnosis present

## 2019-06-23 DIAGNOSIS — Z7189 Other specified counseling: Secondary | ICD-10-CM | POA: Diagnosis not present

## 2019-06-23 DIAGNOSIS — I340 Nonrheumatic mitral (valve) insufficiency: Secondary | ICD-10-CM

## 2019-06-23 DIAGNOSIS — M1990 Unspecified osteoarthritis, unspecified site: Secondary | ICD-10-CM | POA: Diagnosis present

## 2019-06-23 LAB — URINALYSIS, ROUTINE W REFLEX MICROSCOPIC
Bacteria, UA: NONE SEEN
Bilirubin Urine: NEGATIVE
Glucose, UA: NEGATIVE mg/dL
Ketones, ur: NEGATIVE mg/dL
Leukocytes,Ua: NEGATIVE
Nitrite: NEGATIVE
Protein, ur: NEGATIVE mg/dL
Specific Gravity, Urine: 1.008 (ref 1.005–1.030)
pH: 6 (ref 5.0–8.0)

## 2019-06-23 LAB — CBC WITH DIFFERENTIAL/PLATELET
Abs Immature Granulocytes: 0.01 10*3/uL (ref 0.00–0.07)
Basophils Absolute: 0.1 10*3/uL (ref 0.0–0.1)
Basophils Relative: 1 %
Eosinophils Absolute: 0.2 10*3/uL (ref 0.0–0.5)
Eosinophils Relative: 3 %
HCT: 39.1 % (ref 36.0–46.0)
Hemoglobin: 12.7 g/dL (ref 12.0–15.0)
Immature Granulocytes: 0 %
Lymphocytes Relative: 30 %
Lymphs Abs: 1.8 10*3/uL (ref 0.7–4.0)
MCH: 28.5 pg (ref 26.0–34.0)
MCHC: 32.5 g/dL (ref 30.0–36.0)
MCV: 87.7 fL (ref 80.0–100.0)
Monocytes Absolute: 0.5 10*3/uL (ref 0.1–1.0)
Monocytes Relative: 8 %
Neutro Abs: 3.5 10*3/uL (ref 1.7–7.7)
Neutrophils Relative %: 58 %
Platelets: 274 10*3/uL (ref 150–400)
RBC: 4.46 MIL/uL (ref 3.87–5.11)
RDW: 12.8 % (ref 11.5–15.5)
WBC: 6.1 10*3/uL (ref 4.0–10.5)
nRBC: 0 % (ref 0.0–0.2)

## 2019-06-23 LAB — ECHOCARDIOGRAM COMPLETE
Height: 67.5 in
Weight: 2928 oz

## 2019-06-23 LAB — HEPARIN LEVEL (UNFRACTIONATED): Heparin Unfractionated: 0.5 IU/mL (ref 0.30–0.70)

## 2019-06-23 LAB — COMPREHENSIVE METABOLIC PANEL
ALT: 38 U/L (ref 0–44)
AST: 33 U/L (ref 15–41)
Albumin: 4.1 g/dL (ref 3.5–5.0)
Alkaline Phosphatase: 61 U/L (ref 38–126)
Anion gap: 9 (ref 5–15)
BUN: 19 mg/dL (ref 8–23)
CO2: 25 mmol/L (ref 22–32)
Calcium: 9.3 mg/dL (ref 8.9–10.3)
Chloride: 106 mmol/L (ref 98–111)
Creatinine, Ser: 0.66 mg/dL (ref 0.44–1.00)
GFR calc Af Amer: >60 mL/min (ref >60)
GFR calc non Af Amer: >60 mL/min (ref >60)
Glucose, Bld: 117 mg/dL — ABNORMAL HIGH (ref 70–99)
Potassium: 3.7 mmol/L (ref 3.5–5.1)
Sodium: 140 mmol/L (ref 135–145)
Total Bilirubin: 0.6 mg/dL (ref 0.3–1.2)
Total Protein: 7.1 g/dL (ref 6.5–8.1)

## 2019-06-23 LAB — PROTIME-INR
INR: 1 (ref 0.8–1.2)
Prothrombin Time: 13.1 seconds (ref 11.4–15.2)

## 2019-06-23 LAB — HIV ANTIBODY (ROUTINE TESTING W REFLEX): HIV Screen 4th Generation wRfx: NONREACTIVE

## 2019-06-23 LAB — SARS CORONAVIRUS 2 (TAT 6-24 HRS): SARS Coronavirus 2: NEGATIVE

## 2019-06-23 LAB — MAGNESIUM: Magnesium: 2.3 mg/dL (ref 1.7–2.4)

## 2019-06-23 LAB — TSH: TSH: 1.605 u[IU]/mL (ref 0.350–4.500)

## 2019-06-23 LAB — APTT: aPTT: 27 seconds (ref 24–36)

## 2019-06-23 MED ORDER — HEPARIN (PORCINE) 25000 UT/250ML-% IV SOLN
1200.0000 [IU]/h | INTRAVENOUS | Status: DC
  Administered 2019-06-23 (×2): 1200 [IU]/h via INTRAVENOUS
  Filled 2019-06-23 (×2): qty 250

## 2019-06-23 MED ORDER — ACETAMINOPHEN 650 MG RE SUPP: 650.0000 mg | Freq: Four times a day (QID) | RECTAL | Status: DC | PRN

## 2019-06-23 MED ORDER — POTASSIUM CHLORIDE CRYS ER 20 MEQ PO TBCR
40.0000 meq | EXTENDED_RELEASE_TABLET | Freq: Once | ORAL | Status: AC
  Administered 2019-06-23: 10:00:00 40 meq via ORAL
  Filled 2019-06-23: qty 2

## 2019-06-23 MED ORDER — SODIUM CHLORIDE 0.9% FLUSH
3.0000 mL | Freq: Two times a day (BID) | INTRAVENOUS | Status: DC
  Administered 2019-06-23 – 2019-06-24 (×3): 3 mL via INTRAVENOUS

## 2019-06-23 MED ORDER — DILTIAZEM HCL 30 MG PO TABS
30.0000 mg | ORAL_TABLET | Freq: Four times a day (QID) | ORAL | Status: DC
  Administered 2019-06-23 – 2019-06-24 (×3): 30 mg via ORAL
  Filled 2019-06-23 (×4): qty 1

## 2019-06-23 MED ORDER — ONDANSETRON HCL 4 MG PO TABS: 4.0000 mg | ORAL_TABLET | Freq: Four times a day (QID) | ORAL | Status: DC | PRN

## 2019-06-23 MED ORDER — HEPARIN BOLUS VIA INFUSION
3500.0000 [IU] | Freq: Once | INTRAVENOUS | Status: AC
  Administered 2019-06-23: 06:00:00 3500 [IU] via INTRAVENOUS

## 2019-06-23 MED ORDER — SODIUM CHLORIDE 0.9% FLUSH: 3.0000 mL | INTRAVENOUS | Status: DC | PRN

## 2019-06-23 MED ORDER — DILTIAZEM HCL-DEXTROSE 125-5 MG/125ML-% IV SOLN (PREMIX)
5.0000 mg/h | INTRAVENOUS | Status: DC
  Administered 2019-06-23: 01:00:00 5 mg/h via INTRAVENOUS
  Filled 2019-06-23: qty 125

## 2019-06-23 MED ORDER — ONDANSETRON HCL 4 MG/2ML IJ SOLN: 4.0000 mg | Freq: Four times a day (QID) | INTRAMUSCULAR | Status: DC | PRN

## 2019-06-23 MED ORDER — OXYCODONE HCL 5 MG PO TABS: 5.0000 mg | ORAL_TABLET | Freq: Four times a day (QID) | ORAL | Status: DC | PRN

## 2019-06-23 MED ORDER — ACETAMINOPHEN 325 MG PO TABS: 650.0000 mg | ORAL_TABLET | Freq: Four times a day (QID) | ORAL | Status: DC | PRN

## 2019-06-23 MED ORDER — DILTIAZEM LOAD VIA INFUSION
10.0000 mg | Freq: Once | INTRAVENOUS | Status: AC
  Administered 2019-06-23: 01:00:00 10 mg via INTRAVENOUS
  Filled 2019-06-23: qty 10

## 2019-06-23 MED ORDER — SODIUM CHLORIDE 0.9 % IV SOLN: 250.0000 mL | INTRAVENOUS | Status: DC | PRN

## 2019-06-23 NOTE — Progress Notes (Signed)
ANTICOAGULATION CONSULT NOTE -   Pharmacy Consult for heparin Indication: atrial fibrillation  No Known Allergies  Patient Measurements: Height: 5' 7.5" (171.5 cm) Weight: 183 lb (83 kg) IBW/kg (Calculated) : 62.75 Heparin Dosing Weight: 79.8 kg  Vital Signs: BP: 130/78 (11/15 1230) Pulse Rate: 73 (11/15 1230)  Labs: Recent Labs    06/23/19 0020 06/23/19 0444 06/23/19 1216  HGB 12.7  --   --   HCT 39.1  --   --   PLT 274  --   --   APTT  --  27  --   LABPROT  --  13.1  --   INR  --  1.0  --   HEPARINUNFRC  --   --  0.50  CREATININE 0.66  --   --     Estimated Creatinine Clearance: 76.4 mL/min (by C-G formula based on SCr of 0.66 mg/dL).   Medical History: Past Medical History:  Diagnosis Date  . Anxiety   . GERD (gastroesophageal reflux disease)   . Osteopenia   . Ovarian cyst     Assessment: Tara Brennan  is a 68 y.o. female who came to ED with chief complaint of palpitations. Pharmacy consulted to start heparin in setting of EKG with new onset atrial fibrillation. Patient has no history of anticoagulation.CBC wnl.  Of note: Norvasc was stopped recently and she has been on valsartan for about 4 to 6 weeks. There is a family history of atrial fib and congestive heart failure in her mother and a sister has tachycardia.  HL is therapeutic at 0.5  Goal of Therapy:  Heparin level 0.3-0.7 units/ml Monitor platelets by anticoagulation protocol: Yes   Plan:  Continue heparin infusion at 1200 units/hr Check anti-Xa level  daily while on heparin Continue to monitor H&H and platelets F/u transition to po DOAC  Thank you for allowing [place] to participate in this patients care.  Isac Sarna, BS [name], BCPS Clinical Pharmacist Pager [phone] 06/23/2019,12:48 PM

## 2019-06-23 NOTE — H&P (Addendum)
TRH H&P    Patient Demographics:    Tara Brennan, is a 68 y.o. female  MRN: [id number]  DOB - 1950-10-03  Admit Date - 06/22/2019  Referring MD/NP/PA: Rolland Porter  Outpatient Primary MD for the patient is Sinda Du, MD  Patient coming from: Home  Chief complaint-palpitations   HPI:    Tara Brennan  is a 68 y.o. female, with history of hypertension, anxiety, GERD who came to ED with chief complaint of palpitations which started around 8:30 PM yesterday while she was chasing a cricket in her house.  She denies shortness of breath.  Denies chest pain.  Denies feeling dizzy or lightheaded.  Patient says that she has had palpitations before but they would usually resolve with rest.  She does not have history of CAD.  No known history of thyroid problems.  Patient states that her Norvasc was stopped recently and she has been on valsartan for about 4 to 6 weeks. In the ED EKG showed new onset atrial fibrillation.  Patient was given the choice to go to [facility] for possible cardioversion.  Patient declined cardioversion at this time. No previous history of stroke or seizures. No history of CHF Denies nausea vomiting or diarrhea Denies abdominal pain or dysuria    Review of systems:    In addition to the HPI above,    All other systems reviewed and are negative.    Past History of the following :    Past Medical History:  Diagnosis Date  . Anxiety   . GERD (gastroesophageal reflux disease)   . Osteopenia   . Ovarian cyst       Past Surgical History:  Procedure Laterality Date  . ABDOMINAL HYSTERECTOMY  1984  . ESOPHAGOGASTRODUODENOSCOPY  06/28/2012   Procedure: ESOPHAGOGASTRODUODENOSCOPY (EGD);  Surgeon: Jeryl Columbia, MD;  Location: [facility] ENDOSCOPY;  Service: Endoscopy;  Laterality: N/A;  . OVARIAN CYST SURGERY    . PARTIAL HYSTERECTOMY        Social History:      Social History   Tobacco Use  . Smoking status: Never Smoker  . Smokeless tobacco: Never Used  Substance Use Topics  . Alcohol use: No       Family History :     Family History  Problem Relation Age of Onset  . Hypertension Mother   . Cancer Father   . Diabetes Sister   . Arthritis Other       Home Medications:   Prior to Admission medications   Medication Sig Start Date End Date Taking? Authorizing Provider  alendronate (FOSAMAX) 70 MG tablet Take 70 mg by mouth every 7 (seven) days. Take on sundays  Take with a full glass of water on an empty stomach.    Yes [provider]  aspirin 81 MG chewable tablet Chew 81 mg by mouth at bedtime.     Yes [provider]  cholecalciferol (VITAMIN D) 1000 units tablet Take 1,000 Units by mouth daily.   Yes [provider]  valsartan (DIOVAN) 160 MG tablet Take  160 mg by mouth daily.   Yes [provider]  acetaminophen (TYLENOL) 325 MG tablet Take 2 tablets (650 mg total) by mouth every 6 (six) hours. 12/03/17   Rayburn, Floyce Stakes, PA-C  amLODipine (NORVASC) 5 MG tablet TK 1 T PO QD 08/17/17   [provider]  oxyCODONE (OXY IR/ROXICODONE) 5 MG immediate release tablet Take 1 tablet (5 mg total) by mouth every 6 (six) hours as needed for severe pain. 12/03/17   Rayburn, Floyce Stakes, PA-C     Allergies:    No Known Allergies   Physical Exam:   Vitals  Blood pressure 125/81, pulse 77, temperature 98.2 F (36.8 C), temperature source Oral, resp. rate (!) 24, height 5' 7.5" (1.715 m), weight 83 kg, SpO2 99 %.  1.  General: Appears in no acute distress  2. Psychiatric: Alert, oriented x3, intact insight and judgment  3. Neurologic: Cranial nerves II through XII grossly intact, no focal deficit noted at this time  4. HEENMT:  Atraumatic normocephalic, extraocular muscle intact  5. Respiratory : Clear to auscultation bilaterally  6. Cardiovascular : S1-S2, regular rhythm, no edema in the lower  extremities  7. Gastrointestinal:  Abdomen is soft, nontender, no organomegaly      Data Review:    CBC Recent Labs  Lab 06/23/19 0020  WBC 6.1  HGB 12.7  HCT 39.1  PLT 274  MCV 87.7  MCH 28.5  MCHC 32.5  RDW 12.8  LYMPHSABS 1.8  MONOABS 0.5  EOSABS 0.2  BASOSABS 0.1   ------------------------------------------------------------------------------------------------------------------  Results for orders placed or performed during the hospital encounter of 06/22/19 (from the past 48 hour(s))  CBC with Differential     Status: None   Collection Time: 06/23/19 12:20 AM  Result Value Ref Range   WBC 6.1 4.0 - 10.5 K/uL   RBC 4.46 3.87 - 5.11 MIL/uL   Hemoglobin 12.7 12.0 - 15.0 g/dL   HCT 39.1 36.0 - 46.0 %   MCV 87.7 80.0 - 100.0 fL   MCH 28.5 26.0 - 34.0 pg   MCHC 32.5 30.0 - 36.0 g/dL   RDW 12.8 11.5 - 15.5 %   Platelets 274 150 - 400 K/uL   nRBC 0.0 0.0 - 0.2 %   Neutrophils Relative % 58 %   Neutro Abs 3.5 1.7 - 7.7 K/uL   Lymphocytes Relative 30 %   Lymphs Abs 1.8 0.7 - 4.0 K/uL   Monocytes Relative 8 %   Monocytes Absolute 0.5 0.1 - 1.0 K/uL   Eosinophils Relative 3 %   Eosinophils Absolute 0.2 0.0 - 0.5 K/uL   Basophils Relative 1 %   Basophils Absolute 0.1 0.0 - 0.1 K/uL   Immature Granulocytes 0 %   Abs Immature Granulocytes 0.01 0.00 - 0.07 K/uL    Comment: Performed at [facility], [address] [place], [place] [address]  Comprehensive metabolic panel     Status: Abnormal   Collection Time: 06/23/19 12:20 AM  Result Value Ref Range   Sodium 140 135 - 145 mmol/L   Potassium 3.7 3.5 - 5.1 mmol/L   Chloride 106 98 - 111 mmol/L   CO2 25 22 - 32 mmol/L   Glucose, Bld 117 (H) 70 - 99 mg/dL   BUN 19 8 - 23 mg/dL   Creatinine, Ser 0.66 0.44 - 1.00 mg/dL   Calcium 9.3 8.9 - 10.3 mg/dL   Total Protein 7.1 6.5 - 8.1 g/dL   Albumin 4.1 3.5 - 5.0 g/dL  AST 33 15 - 41 U/L   ALT 38 0 - 44 U/L   Alkaline Phosphatase 61 38 - 126 U/L   Total Bilirubin  0.6 0.3 - 1.2 mg/dL   GFR calc non Af Amer >60 >60 mL/min   GFR calc Af Amer >60 >60 mL/min   Anion gap 9 5 - 15    Comment: Performed at [facility], [address] [place], [place] [address]  Magnesium     Status: None   Collection Time: 06/23/19 12:28 AM  Result Value Ref Range   Magnesium 2.3 1.7 - 2.4 mg/dL    Comment: Performed at [facility], [address] [place], [place] [address]  TSH     Status: None   Collection Time: 06/23/19 12:28 AM  Result Value Ref Range   TSH 1.605 0.350 - 4.500 uIU/mL    Comment: Performed by a 3rd Generation assay with a functional sensitivity of <=0.01 uIU/mL. Performed at [facility], [address], [place], [place] [address]   Urinalysis, Routine w reflex microscopic     Status: Abnormal   Collection Time: 06/23/19 12:42 AM  Result Value Ref Range   Color, Urine STRAW (A) YELLOW   APPearance CLEAR CLEAR   Specific Gravity, Urine 1.008 1.005 - 1.030   pH 6.0 5.0 - 8.0   Glucose, UA NEGATIVE NEGATIVE mg/dL   Hgb urine dipstick SMALL (A) NEGATIVE   Bilirubin Urine NEGATIVE NEGATIVE   Ketones, ur NEGATIVE NEGATIVE mg/dL   Protein, ur NEGATIVE NEGATIVE mg/dL   Nitrite NEGATIVE NEGATIVE   Leukocytes,Ua NEGATIVE NEGATIVE   RBC / HPF 0-5 0 - 5 RBC/hpf   WBC, UA 0-5 0 - 5 WBC/hpf   Bacteria, UA NONE SEEN NONE SEEN    Comment: Performed at [facility], [address], [place], [place] [address]    Chemistries  Recent Labs  Lab 06/23/19 0020 06/23/19 0028  NA 140  --   K 3.7  --   CL 106  --   CO2 25  --   GLUCOSE 117*  --   BUN 19  --   CREATININE 0.66  --   CALCIUM 9.3  --   MG  --  2.3  AST 33  --   ALT 38  --   ALKPHOS 61  --   BILITOT 0.6  --    ------------------------------------------------------------------------------------------------------------------  ------------------------------------------------------------------------------------------------------------------ GFR: Estimated Creatinine Clearance: 76.4 mL/min  (by C-G formula based on SCr of 0.66 mg/dL). Liver Function Tests: Recent Labs  Lab 06/23/19 0020  AST 33  ALT 38  ALKPHOS 61  BILITOT 0.6  PROT 7.1  ALBUMIN 4.1   No results for input(s): LIPASE, AMYLASE in the last 168 hours. No results for input(s): AMMONIA in the last 168 hours. Coagulation Profile: No results for input(s): INR, PROTIME in the last 168 hours. Cardiac Enzymes: No results for input(s): CKTOTAL, CKMB, CKMBINDEX, TROPONINI in the last 168 hours. BNP (last 3 results) No results for input(s): PROBNP in the last 8760 hours. HbA1C: No results for input(s): HGBA1C in the last 72 hours. CBG: No results for input(s): GLUCAP in the last 168 hours. Lipid Profile: No results for input(s): CHOL, HDL, LDLCALC, TRIG, CHOLHDL, LDLDIRECT in the last 72 hours. Thyroid Function Tests: Recent Labs    06/23/19 0028  TSH 1.605   Anemia Panel: No results for input(s): VITAMINB12, FOLATE, FERRITIN, TIBC, IRON, RETICCTPCT in the last 72 hours.  --------------------------------------------------------------------------------------------------------------- Urine analysis:    Component Value Date/Time  COLORURINE STRAW (A) 06/23/2019 0042   APPEARANCEUR CLEAR 06/23/2019 0042   LABSPEC 1.008 06/23/2019 0042   PHURINE 6.0 06/23/2019 0042   GLUCOSEU NEGATIVE 06/23/2019 0042   HGBUR SMALL (A) 06/23/2019 0042   BILIRUBINUR NEGATIVE 06/23/2019 [place] 06/23/2019 0042   PROTEINUR NEGATIVE 06/23/2019 0042   NITRITE NEGATIVE 06/23/2019 0042   LEUKOCYTESUR NEGATIVE 06/23/2019 0042      Imaging Results:    No results found.  My personal review of EKG: Rhythm atrial flutter   Assessment & Plan:    Active Problems:   Atrial fibrillation with RVR (HCC)   1. Atrial fibrillation/atrial flutter with RVR, new onset-patient has new onset A. fib, started on Cardizem gtt.  CHA2DS2VASc score of at least 3, will start heparin per pharmacy consultation.  TSH is  1.605.  Will obtain echocardiogram in a.m.  Patient has declined cardioversion option at this time.  Will consult cardiology in a.m.  2. Hypertension-we will hold Diovan.  Norvasc was recently discontinued.  Started on IV Cardizem as above.  Closely monitor blood pressure, if blood pressure starts to rise, consider restarting Diovan.    DVT Prophylaxis-   Heparin  AM Labs Ordered, also please review Full Orders  Family Communication: Admission, patients condition and plan of care including tests being ordered have been discussed with the patient and family member at bedside who indicate understanding and agree with the plan and Code Status.  Code Status: Full code  Admission status: Inpatient: Based on patients clinical presentation and evaluation of above clinical data, I have made determination that patient meets Inpatient criteria at this time.  Time spent in minutes : 60 minutes   Oswald Hillock M.D on 06/23/2019 at 4:17 AM

## 2019-06-23 NOTE — Progress Notes (Signed)
This is an assumption of care note.  She has new onset atrial fib.  She is going to be on heparin temporarily have cardiology consultation echocardiogram etc.  No chest pain.  She has had episodes of brief palpitations in the past but nothing like this.

## 2019-06-23 NOTE — ED Provider Notes (Addendum)
[facility] EMERGENCY DEPARTMENT Provider Note   CSN: [id number] Arrival date & time: 06/22/19  2319   Time seen 12:25 AM  History   Chief Complaint Chief Complaint  Patient presents with  . Tachycardia    HPI Tara Brennan is a 68 y.o. female.     HPI patient states she was chasing a cricket in her house, when she had acute onset of feeling her heart beating fast.  She states sometimes the feeling goes up into her throat.  She denies chest pain.  She also denies feeling dizzy, lightheaded, sweaty, nauseated, or short of breath.  She states she has had it before with times of stress such as arguing with her husband however he does not normally last very long.  She has never had to see a cardiologist.  She denies any prior history of thyroid problems or swelling of her legs.  She states her Norvasc was stopped recently and she has been on valsartan for about 4 to 6 weeks.  There is a family history of atrial fib and congestive heart failure in her mother and a sister has tachycardia.  She has never had to see a cardiologist before.  PCP Sinda Du, MD   Past Medical History:  Diagnosis Date  . Anxiety   . GERD (gastroesophageal reflux disease)   . Osteopenia   . Ovarian cyst     Patient Active Problem List   Diagnosis Date Noted  . Pneumothorax, right 11/30/2017  . Pain in toe 04/04/2013  . 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 OA (osteoarthritis) of knee 12/07/2011  . Knee pain 12/07/2011  . KNEE, ARTHRITIS, DEGEN./OSTEO 09/14/2009  . KNEE PAIN 09/14/2009    Past Surgical History:  Procedure Laterality Date  . ABDOMINAL HYSTERECTOMY  1984  . ESOPHAGOGASTRODUODENOSCOPY  06/28/2012   Procedure: ESOPHAGOGASTRODUODENOSCOPY (EGD);  Surgeon: Jeryl Columbia, MD;  Location: [facility] ENDOSCOPY;  Service: Endoscopy;  Laterality: N/A;  . OVARIAN CYST SURGERY    . PARTIAL HYSTERECTOMY       OB History    Gravida  3   Para  2   Term      Preterm  2   AB  1   Living  2     SAB  1   TAB      Ectopic      Multiple      Live Births               Home Medications    Prior to Admission medications   Medication Sig Start Date End Date Taking? Authorizing Provider  alendronate (FOSAMAX) 70 MG tablet Take 70 mg by mouth every 7 (seven) days. Take on sundays  Take with a full glass of water on an empty stomach.    Yes [provider]  aspirin 81 MG chewable tablet Chew 81 mg by mouth at bedtime.     Yes [provider]  cholecalciferol (VITAMIN D) 1000 units tablet Take 1,000 Units by mouth daily.   Yes [provider]  valsartan (DIOVAN) 160 MG tablet Take 160 mg by mouth daily.   Yes [provider]  acetaminophen (TYLENOL) 325 MG tablet Take 2 tablets (650 mg total) by mouth every 6 (six) hours. 12/03/17   Rayburn, Floyce Stakes, PA-C  amLODipine (NORVASC) 5 MG tablet TK 1 T PO QD 08/17/17   [provider]  oxyCODONE (OXY IR/ROXICODONE) 5 MG immediate release tablet Take 1 tablet (5 mg total) by mouth every  6 (six) hours as needed for severe pain. 12/03/17   Rayburn, Floyce Stakes, PA-C    Family History Family History  Problem Relation Age of Onset  . Hypertension Mother   . Cancer Father   . Diabetes Sister   . Arthritis Other     Social History Social History   Tobacco Use  . Smoking status: Never Smoker  . Smokeless tobacco: Never Used  Substance Use Topics  . Alcohol use: No  . Drug use: No     Allergies   Patient has no known allergies.   Review of Systems Review of Systems  All other systems reviewed and are negative.    Physical Exam Updated Vital Signs BP 126/83   Pulse 77   Temp 98.2 F (36.8 C) (Oral)   Resp 18   Ht 5' 7.5" (1.715 m)   Wt 83 kg   SpO2 99%   BMI 28.24 kg/m   Physical Exam Vitals signs and nursing note reviewed.  Constitutional:      General: She is not in acute distress.    Appearance: Normal appearance. She is well-developed. She is not  ill-appearing or toxic-appearing.  HENT:     Head: Normocephalic and atraumatic.     Right Ear: External ear normal.     Left Ear: External ear normal.     Nose: Nose normal. No mucosal edema or rhinorrhea.     Mouth/Throat:     Mouth: Mucous membranes are moist.     Dentition: No dental abscesses.     Pharynx: No oropharyngeal exudate, posterior oropharyngeal erythema or uvula swelling.  Eyes:     Extraocular Movements: Extraocular movements intact.     Conjunctiva/sclera: Conjunctivae normal.     Pupils: Pupils are equal, round, and reactive to light.  Neck:     Musculoskeletal: Full passive range of motion without pain, normal range of motion and neck supple.     Thyroid: No thyromegaly.  Cardiovascular:     Rate and Rhythm: Tachycardia present. Rhythm irregular.     Heart sounds: Normal heart sounds. No murmur. No friction rub. No gallop.   Pulmonary:     Effort: Pulmonary effort is normal. No respiratory distress.     Breath sounds: Normal breath sounds. No wheezing, rhonchi or rales.  Chest:     Chest wall: No tenderness or crepitus.  Abdominal:     General: Bowel sounds are normal. There is no distension.     Palpations: Abdomen is soft.     Tenderness: There is no abdominal tenderness. There is no guarding or rebound.  Musculoskeletal: Normal range of motion.        General: No tenderness.     Right lower leg: No edema.     Left lower leg: No edema.     Comments: Moves all extremities well.   Skin:    General: Skin is warm and dry.     Coloration: Skin is not pale.     Findings: No erythema or rash.  Neurological:     General: No focal deficit present.     Mental Status: She is alert and oriented to person, place, and time.     Cranial Nerves: No cranial nerve deficit.  Psychiatric:        Mood and Affect: Mood normal. Mood is not anxious.        Speech: Speech normal.        Behavior: Behavior normal.        Thought  Content: Thought content normal.      ED  Treatments / Results  Labs (all labs ordered are listed, but only abnormal results are displayed) Results for orders placed or performed during the hospital encounter of 06/22/19  CBC with Differential  Result Value Ref Range   WBC 6.1 4.0 - 10.5 K/uL   RBC 4.46 3.87 - 5.11 MIL/uL   Hemoglobin 12.7 12.0 - 15.0 g/dL   HCT 39.1 36.0 - 46.0 %   MCV 87.7 80.0 - 100.0 fL   MCH 28.5 26.0 - 34.0 pg   MCHC 32.5 30.0 - 36.0 g/dL   RDW 12.8 11.5 - 15.5 %   Platelets 274 150 - 400 K/uL   nRBC 0.0 0.0 - 0.2 %   Neutrophils Relative % 58 %   Neutro Abs 3.5 1.7 - 7.7 K/uL   Lymphocytes Relative 30 %   Lymphs Abs 1.8 0.7 - 4.0 K/uL   Monocytes Relative 8 %   Monocytes Absolute 0.5 0.1 - 1.0 K/uL   Eosinophils Relative 3 %   Eosinophils Absolute 0.2 0.0 - 0.5 K/uL   Basophils Relative 1 %   Basophils Absolute 0.1 0.0 - 0.1 K/uL   Immature Granulocytes 0 %   Abs Immature Granulocytes 0.01 0.00 - 0.07 K/uL  Comprehensive metabolic panel  Result Value Ref Range   Sodium 140 135 - 145 mmol/L   Potassium 3.7 3.5 - 5.1 mmol/L   Chloride 106 98 - 111 mmol/L   CO2 25 22 - 32 mmol/L   Glucose, Bld 117 (H) 70 - 99 mg/dL   BUN 19 8 - 23 mg/dL   Creatinine, Ser 0.66 0.44 - 1.00 mg/dL   Calcium 9.3 8.9 - 10.3 mg/dL   Total Protein 7.1 6.5 - 8.1 g/dL   Albumin 4.1 3.5 - 5.0 g/dL   AST 33 15 - 41 U/L   ALT 38 0 - 44 U/L   Alkaline Phosphatase 61 38 - 126 U/L   Total Bilirubin 0.6 0.3 - 1.2 mg/dL   GFR calc non Af Amer >60 >60 mL/min   GFR calc Af Amer >60 >60 mL/min   Anion gap 9 5 - 15  Urinalysis, Routine w reflex microscopic  Result Value Ref Range   Color, Urine STRAW (A) YELLOW   APPearance CLEAR CLEAR   Specific Gravity, Urine 1.008 1.005 - 1.030   pH 6.0 5.0 - 8.0   Glucose, UA NEGATIVE NEGATIVE mg/dL   Hgb urine dipstick SMALL (A) NEGATIVE   Bilirubin Urine NEGATIVE NEGATIVE   Ketones, ur NEGATIVE NEGATIVE mg/dL   Protein, ur NEGATIVE NEGATIVE mg/dL   Nitrite NEGATIVE NEGATIVE    Leukocytes,Ua NEGATIVE NEGATIVE   RBC / HPF 0-5 0 - 5 RBC/hpf   WBC, UA 0-5 0 - 5 WBC/hpf   Bacteria, UA NONE SEEN NONE SEEN  Magnesium  Result Value Ref Range   Magnesium 2.3 1.7 - 2.4 mg/dL  TSH  Result Value Ref Range   TSH 1.605 0.350 - 4.500 uIU/mL   Laboratory interpretation all normal except mild hyperglycemia   EKG EKG Interpretation  Date/Time:  Saturday June 22 2019 23:54:04 EST Ventricular Rate:  129 PR Interval:    QRS Duration: 93 QT Interval:  309 QTC Calculation: 453 R Axis:   45 Text Interpretation: Atrial flutter/fibrillation Low voltage, precordial leads Since last tracing rate faster 30 Nov 2017 Atrial fibrillation has replaced Normal sinus rhythm Confirmed by Rolland Porter [phone]) on 06/23/2019 12:08:31 AM   Radiology No results  found.  Procedures .Critical Care Performed by: Rolland Porter, MD Authorized by: Rolland Porter, MD   Critical care provider statement:    Critical care time (minutes):  45   Critical care was time spent personally by me on the following activities:  Discussions with consultants, evaluation of patient's response to treatment, examination of patient, ordering and performing treatments and interventions, ordering and review of laboratory studies, ordering and review of radiographic studies, pulse oximetry, re-evaluation of patient's condition, obtaining history from patient or surrogate and review of old charts   (including critical care time)  Medications Ordered in ED Medications  diltiazem (CARDIZEM) 1 mg/mL load via infusion 10 mg (10 mg Intravenous Bolus from Bag 06/23/19 0055)    And  diltiazem (CARDIZEM) 125 mg in dextrose 5% 125 mL (1 mg/mL) infusion (5 mg/hr Intravenous New Bag/Given 06/23/19 0055)     Initial Impression / Assessment and Plan / ED Course  I have reviewed the triage vital signs and the nursing notes.  Pertinent labs & imaging results that were available during my care of the patient were reviewed by me  and considered in my medical decision making (see chart for details).      During my exam patient's heart rate got up to 150.  Patient has newly documented atrial fibrillation although that sounds like she has had brief episodes before.  She was given diltiazem bolus and drip.   Recheck at 1:45 AM patient's heart rate is in the seventies, she still is in atrial flutter.  She denies chest pain or shortness of breath.  Her lab work has returned and it is all normal, her electrolytes are normal.  We discussed cardioversion or continuing on the IV medications and she prefers to stay on the IV medications.  03:31 AM Dr Darrick Meigs, hospitalist will admit patient.  Final Clinical Impressions(s) / ED Diagnoses   Final diagnoses:  Atrial fibrillation with RVR ([place])  Atrial flutter, unspecified type [facility])    Plan admission  Rolland Porter, MD, Barbette Or, MD 06/23/19 Wonda Amis    Rolland Porter, MD 07/02/19 [phone]

## 2019-06-23 NOTE — Progress Notes (Signed)
*  PRELIMINARY RESULTS* Echocardiogram 2D Echocardiogram has been performed.  Leavy Cella 06/23/2019, 9:17 AM

## 2019-06-23 NOTE — Progress Notes (Signed)
ANTICOAGULATION CONSULT NOTE - Initial Consult  Pharmacy Consult for heparin Indication: atrial fibrillation  No Known Allergies  Patient Measurements: Height: 5' 7.5" (171.5 cm) Weight: 183 lb (83 kg) IBW/kg (Calculated) : 62.75 Heparin Dosing Weight: 79.8 kg  Vital Signs: Temp: 98.2 F (36.8 C) (11/14 2351) Temp Source: Oral (11/14 2351) BP: 125/81 (11/15 0330) Pulse Rate: 77 (11/15 0330)  Labs: Recent Labs    06/23/19 0020  HGB 12.7  HCT 39.1  PLT 274  CREATININE 0.66    Estimated Creatinine Clearance: 76.4 mL/min (by C-G formula based on SCr of 0.66 mg/dL).   Medical History: Past Medical History:  Diagnosis Date  . Anxiety   . GERD (gastroesophageal reflux disease)   . Osteopenia   . Ovarian cyst     Assessment: Tara Brennan  is a 68 y.o. female who came to ED with chief complaint of palpitations. Pharmacy consulted to start heparin in setting of EKG with new onset atrial fibrillation. Patient has no history of anticoagulation.CBC wnl.  Of note: Norvasc was stopped recently and she has been on valsartan for about 4 to 6 weeks. There is a family history of atrial fib and congestive heart failure in her mother and a sister has tachycardia.  Goal of Therapy:  Heparin level 0.3-0.7 units/ml Monitor platelets by anticoagulation protocol: Yes   Plan:  Baseline aPTT and PT/INR Give 3500 units bolus x 1 Start heparin infusion at 1200 units/hr Check anti-Xa level in 6-8 hours and daily while on heparin Continue to monitor H&H and platelets   Thank you for allowing [place] to participate in this patients care. Jens Som, PharmD 06/23/2019,4:18 AM

## 2019-06-24 ENCOUNTER — Inpatient Hospital Stay (HOSPITAL_COMMUNITY): Payer: PPO

## 2019-06-24 ENCOUNTER — Encounter (HOSPITAL_COMMUNITY): Payer: Self-pay | Admitting: Radiology

## 2019-06-24 DIAGNOSIS — I4891 Unspecified atrial fibrillation: Principal | ICD-10-CM

## 2019-06-24 DIAGNOSIS — Z7189 Other specified counseling: Secondary | ICD-10-CM

## 2019-06-24 DIAGNOSIS — I10 Essential (primary) hypertension: Secondary | ICD-10-CM

## 2019-06-24 LAB — BASIC METABOLIC PANEL
Anion gap: 10 (ref 5–15)
BUN: 15 mg/dL (ref 8–23)
CO2: 26 mmol/L (ref 22–32)
Calcium: 9.2 mg/dL (ref 8.9–10.3)
Chloride: 107 mmol/L (ref 98–111)
Creatinine, Ser: 0.56 mg/dL (ref 0.44–1.00)
GFR calc Af Amer: >60 mL/min (ref >60)
GFR calc non Af Amer: >60 mL/min (ref >60)
Glucose, Bld: 105 mg/dL — ABNORMAL HIGH (ref 70–99)
Potassium: 3.7 mmol/L (ref 3.5–5.1)
Sodium: 143 mmol/L (ref 135–145)

## 2019-06-24 LAB — HEPARIN LEVEL (UNFRACTIONATED): Heparin Unfractionated: 0.93 IU/mL — ABNORMAL HIGH (ref 0.30–0.70)

## 2019-06-24 MED ORDER — APIXABAN 5 MG PO TABS
5.0000 mg | ORAL_TABLET | Freq: Two times a day (BID) | ORAL | Status: DC
  Administered 2019-06-24: 11:00:00 5 mg via ORAL
  Filled 2019-06-24: qty 1

## 2019-06-24 MED ORDER — DILTIAZEM HCL ER COATED BEADS 180 MG PO CP24: 180.0000 mg | ORAL_CAPSULE | Freq: Every day | ORAL | 5 refills | Status: DC

## 2019-06-24 MED ORDER — POTASSIUM CHLORIDE CRYS ER 20 MEQ PO TBCR
40.0000 meq | EXTENDED_RELEASE_TABLET | Freq: Once | ORAL | Status: AC
  Administered 2019-06-24: 11:00:00 40 meq via ORAL
  Filled 2019-06-24: qty 2

## 2019-06-24 MED ORDER — DILTIAZEM HCL ER COATED BEADS 180 MG PO CP24
180.0000 mg | ORAL_CAPSULE | Freq: Every day | ORAL | Status: DC
  Administered 2019-06-24: 11:00:00 180 mg via ORAL
  Filled 2019-06-24: qty 1

## 2019-06-24 MED ORDER — APIXABAN 5 MG PO TABS: 5.0000 mg | ORAL_TABLET | Freq: Two times a day (BID) | ORAL | 5 refills | Status: DC

## 2019-06-24 NOTE — Consult Note (Addendum)
Cardiology Consultation:   Patient ID: BRINKLEY MARQUISS MRN: [id number]; DOB: 1951-03-11  Admit date: 06/22/2019 Date of Consult: 06/24/2019  Primary Care Provider: Sinda Du, MD Primary Cardiologist: No primary care provider on file. new Primary Electrophysiologist:  None    Patient Profile:   Tara Brennan is a 68 y.o. female with a hx of HTN  who is being seen today for the evaluation of Afib with RVR at the request of Dr. Luan Pulling.  History of Present Illness:   Tara Brennan is a 68 yo female patient with history of HTN, anxiety GERD admitted with new onset Afib with RVR converted to NSR with IV diltiazem. CHADSVASC=3-female,age,HTN. Patient says she's had some palpitations in the past with anxiety but could take a deep breath and it would go away. Yesterday she was getting a cricket out of her house when it started racing and didn't stop. BP was up and HR never got higher than 120/m. Never smoked, no thyroid problems, caffeine use or alcohol use. She walks 30 min daily and babysits her grandchildren 3 days/week. Her mother has Afib and CHF. She has been on the nutrisystem diet for 1 week. They have shakes and prepared meals. No history of chest pain, dyspnea, dyspnea on exertion, dizziness or presyncope.   Heart Pathway Score:     Past Medical History:  Diagnosis Date  . Anxiety   . GERD (gastroesophageal reflux disease)   . Osteopenia   . Ovarian cyst     Past Surgical History:  Procedure Laterality Date  . ABDOMINAL HYSTERECTOMY  1984  . ESOPHAGOGASTRODUODENOSCOPY  06/28/2012   Procedure: ESOPHAGOGASTRODUODENOSCOPY (EGD);  Surgeon: Jeryl Columbia, MD;  Location: [facility] ENDOSCOPY;  Service: Endoscopy;  Laterality: N/A;  . OVARIAN CYST SURGERY    . PARTIAL HYSTERECTOMY       Home Medications:  Prior to Admission medications   Medication Sig Start Date End Date Taking? Authorizing Provider  alendronate (FOSAMAX) 70 MG tablet Take 70 mg by mouth once a week. Take on  Monday Take with a full glass of water on an empty stomach.   Yes [provider]  aspirin 81 MG chewable tablet Chew 81 mg by mouth at bedtime.     Yes [provider]  cholecalciferol (VITAMIN D) 1000 units tablet Take 2,000 Units by mouth daily.    Yes [provider]  esomeprazole (NEXIUM) 20 MG capsule Take 20 mg by mouth daily at 12 noon.   Yes [provider]  ibuprofen (ADVIL) 200 MG tablet Take 200-400 mg by mouth every 6 (six) hours as needed.   Yes [provider]  valsartan (DIOVAN) 160 MG tablet Take 160 mg by mouth daily.   Yes [provider]    Inpatient Medications: Scheduled Meds: . diltiazem  30 mg Oral Q6H  . potassium chloride  40 mEq Oral Once  . sodium chloride flush  3 mL Intravenous Q12H   Continuous Infusions: . sodium chloride    . heparin 1,200 Units/hr (06/23/19 2323)   PRN Meds: sodium chloride, acetaminophen **OR** acetaminophen, ondansetron **OR** ondansetron (ZOFRAN) IV, oxyCODONE, sodium chloride flush  Allergies:   No Known Allergies  Social History:   Social History   Socioeconomic History  . Marital status: Married    Spouse name: Not on file  . Number of children: Not on file  . Years of education: Not on file  . Highest education level: Not on file  Occupational History  . Not on file  Social Needs  . Financial resource strain: Not on file  . Food insecurity    Worry: Not on file    Inability: Not on file  . Transportation needs    Medical: Not on file    Non-medical: Not on file  Tobacco Use  . Smoking status: Never Smoker  . Smokeless tobacco: Never Used  Substance and Sexual Activity  . Alcohol use: No  . Drug use: No  . Sexual activity: Yes    Birth control/protection: Surgical  Lifestyle  . Physical activity    Days per week: Not on file    Minutes per session: Not on file  . Stress: Not on file  Relationships  . Social Herbalist on phone: Not on file     Gets together: Not on file    Attends religious service: Not on file    Active member of club or organization: Not on file    Attends meetings of clubs or organizations: Not on file    Relationship status: Not on file  . Intimate partner violence    Fear of current or ex partner: Not on file    Emotionally abused: Not on file    Physically abused: Not on file    Forced sexual activity: Not on file  Other Topics Concern  . Not on file  Social History Narrative  . Not on file    Family History:     Family History  Problem Relation Age of Onset  . Hypertension Mother   . Cancer Father   . Diabetes Sister   . Arthritis Other      ROS:  Please see the history of present illness.  Review of Systems  Constitution: Negative.  HENT: Negative.   Eyes: Negative.   Cardiovascular: Negative.   Respiratory: Negative.   Hematologic/Lymphatic: Negative.   Musculoskeletal: Negative.  Negative for joint pain.  Gastrointestinal: Negative.   Genitourinary: Negative.   Neurological: Negative.    All other ROS reviewed and negative.     Physical Exam/Data:   Vitals:   06/23/19 1939 06/23/19 2147 06/23/19 2350 06/24/19 0456  BP:  117/68  131/71  Pulse:  64  65  Resp:  16  16  Temp:  (!) 101.3 F (38.5 C) 98.1 F (36.7 C) 98.1 F (36.7 C)  TempSrc:  Oral Oral Oral  SpO2: 95% 97%  96%  Weight:      Height:        Intake/Output Summary (Last 24 hours) at 06/24/2019 0911 Last data filed at 06/23/2019 1700 Gross per 24 hour  Intake 148.66 ml  Output -  Net 148.66 ml   Last 3 Weights 06/22/2019 11/30/2017 11/30/2017  Weight (lbs) 183 lb 180 lb 12.4 oz 175 lb  Weight (kg) 83.008 kg 82 kg 79.379 kg     Body mass index is 28.66 kg/m.  General:  Well nourished, well developed, in no acute distress HEENT: normal Lymph: no adenopathy Neck: no JVD Endocrine:  No thryomegaly Vascular: No carotid bruits; FA pulses 2+ bilaterally without bruits  Cardiac:  normal S1, S2; RRR; no  murmur  Lungs:  clear to auscultation bilaterally, no wheezing, rhonchi or rales  Abd: soft, nontender, no hepatomegaly  Ext: no edema Musculoskeletal:  No deformities, BUE and BLE strength normal and equal Skin: warm and dry  Neuro:  CNs 2-12 intact, no focal abnormalities noted Psych:  Normal affect   EKG:  The EKG was personally reviewed and demonstrates:  Afib/flutter at 129/m  Telemetry:  Telemetry was personally reviewed and demonstrates:  NSR  Relevant CV Studies: Echo 06/23/19  IMPRESSIONS      1. Left ventricular ejection fraction, by visual estimation, is 60 to 65%. The left ventricle has normal function. There is mildly increased left ventricular hypertrophy.  2. Left ventricular diastolic function could not be evaluated.  3. The left ventricle has no regional wall motion abnormalities.  4. Global right ventricle has normal systolic function.The right ventricular size is normal. No increase in right ventricular wall thickness.  5. Left atrial size was normal.  6. Right atrial size was normal.  7. The mitral valve is grossly normal. Mild mitral valve regurgitation.  8. The tricuspid valve is grossly normal. Tricuspid valve regurgitation is trivial.  9. The aortic valve is tricuspid. Aortic valve regurgitation is not visualized. No evidence of aortic valve sclerosis or stenosis. 10. The pulmonic valve was grossly normal. Pulmonic valve regurgitation is not visualized. 11. The inferior vena cava is normal in size with greater than 50% respiratory variability, suggesting right atrial pressure of 3 mmHg.   FINDINGS  Left Ventricle: Left ventricular ejection fraction, by visual estimation, is 60 to 65%. The left ventricle has normal function. The left ventricle has no regional wall motion abnormalities. The left ventricular internal cavity size was the left  ventricle is normal in size. There is mildly increased left ventricular hypertrophy. Concentric left ventricular  hypertrophy. The left ventricular diastology could not be evaluated due to atrial fibrillation. Left ventricular diastolic function could not  be evaluated.   Right Ventricle: The right ventricular size is normal. No increase in right ventricular wall thickness. Global RV systolic function is has normal systolic function.   Left Atrium: Left atrial size was normal in size.   Right Atrium: Right atrial size was normal in size   Pericardium: There is no evidence of pericardial effusion.   Mitral Valve: The mitral valve is grossly normal. Mild mitral valve regurgitation.   Tricuspid Valve: The tricuspid valve is grossly normal. Tricuspid valve regurgitation is trivial.   Aortic Valve: The aortic valve is tricuspid. Aortic valve regurgitation is not visualized. The aortic valve is structurally normal, with no evidence of sclerosis or stenosis.   Pulmonic Valve: The pulmonic valve was grossly normal. Pulmonic valve regurgitation is not visualized.   Aorta: The aortic root is normal in size and structure.   Venous: The inferior vena cava is normal in size with greater than 50% respiratory variability, suggesting right atrial pressure of 3 mmHg.     Laboratory Data:  High Sensitivity Troponin:  No results for input(s): TROPONINIHS in the last 720 hours.   Chemistry Recent Labs  Lab 06/23/19 0020 06/24/19 0620  NA 140 143  K 3.7 3.7  CL 106 107  CO2 25 26  GLUCOSE 117* 105*  BUN 19 15  CREATININE 0.66 0.56  CALCIUM 9.3 9.2  GFRNONAA >60 >60  GFRAA >60 >60  ANIONGAP 9 10    Recent Labs  Lab 06/23/19 0020  PROT 7.1  ALBUMIN 4.1  AST 33  ALT 38  ALKPHOS 61  BILITOT 0.6   Hematology Recent Labs  Lab 06/23/19 0020  WBC 6.1  RBC 4.46  HGB 12.7  HCT 39.1  MCV 87.7  MCH 28.5  MCHC 32.5  RDW 12.8  PLT 274   BNPNo results for input(s): BNP, PROBNP in the last 168 hours.  DDimer No results for input(s): DDIMER in the last 168 hours.  Radiology/Studies:  No  results found.  Assessment and Plan:   1. Atrial fibrillation with RVR converted to NSR with IV Cardizem now on 30 mg q 6H-can convert to 180 mg once daily. Add eliquis 5 mg BID for CHADSVASC=3-age,female,HTN 2. HTN on Diovan 3. Fever 101.3 yesterday-no signs of infection UA clear, covid19 negative, no recent viral illness. Patient says they took her temperature after putting a heated blanket on her. No fever since.  Check CXR for completeness.      For questions or updates, please contact [place] Please consult [email or web address] for contact info under     Signed, Ermalinda Barrios, PA-C  06/24/2019 9:11 AM   The patient was seen and examined, and I agree with the history, physical exam, assessment and plan as documented above, with modifications as noted below. I have also personally reviewed all relevant documentation, old records, labs, and both radiographic and cardiovascular studies. I have also independently interpreted old and new ECG's.  68 year old woman with a history of hypertension who had been experiencing tachycardia/palpitations this past Saturday evening.  She checked her heart rate and blood pressure and heart rate was elevated.  She eventually came to the ED and was found to be in rapid atrial fibrillation/flutter.  She was started on IV diltiazem and subsequently converted to sinus rhythm. Prior to this she would experience palpitations for seconds which resolved after taking deep breaths.  She stays quite active at home and watches her grandchildren a few days per week.  She also does a lot of walking.  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  She is doing well now and denies chest pain, palpitations, leg swelling and shortness of breath.  We will convert short acting diltiazem to Cardizem CD 180 mg daily.  I will start apixaban 5 mg twice daily.  I think her Diovan can be held at the time of discharge.  I asked her to monitor her  blood pressure at home.  We will arrange for outpatient follow-up.   Tara Sable, MD, [facility]  06/24/2019 10:10 AM

## 2019-06-24 NOTE — Progress Notes (Signed)
ANTICOAGULATION CONSULT NOTE -   Pharmacy Consult for heparin--> apixaban Indication: atrial fibrillation  No Known Allergies  Patient Measurements: Height: 5\' 7"  (170.2 cm) Weight: 183 lb (83 kg) IBW/kg (Calculated) : 61.6 Heparin Dosing Weight: 79.8 kg  Vital Signs: Temp: 98.1 F (36.7 C) (11/16 0456) Temp Source: Oral (11/16 0456) BP: 131/71 (11/16 0456) Pulse Rate: 65 (11/16 0456)  Labs: Recent Labs    06/23/19 0020 06/23/19 0444 06/23/19 1216 06/24/19 0620  HGB 12.7  --   --   --   HCT 39.1  --   --   --   PLT 274  --   --   --   APTT  --  27  --   --   LABPROT  --  13.1  --   --   INR  --  1.0  --   --   HEPARINUNFRC  --   --  0.50 0.93*  CREATININE 0.66  --   --  0.56    Estimated Creatinine Clearance: 75.6 mL/min (by C-G formula based on SCr of 0.56 mg/dL).   Medical History: Past Medical History:  Diagnosis Date  . Anxiety   . GERD (gastroesophageal reflux disease)   . Osteopenia   . Ovarian cyst     Assessment: Tara Brennan  is a 68 y.o. female who came to ED with chief complaint of palpitations. Pharmacy consulted to start heparin in setting of EKG with new onset atrial fibrillation. Patient has no history of anticoagulation.CBC wnl.  Of note: Norvasc was stopped recently and she has been on valsartan for about 4 to 6 weeks. There is a family history of atrial fib and congestive heart failure in her mother and a sister has tachycardia.  Transition to po apixaban  Goal of Therapy:  Monitor platelets by anticoagulation protocol: Yes   Plan:  D/c heparin apixaban 5mg  po bid Educate patient on eliquis Continue to monitor H&H and platelets  Thank you for allowing [place] to participate in this patients care.  Isac Sarna, BS Pharm D, BCPS Clinical Pharmacist Pager [phone] 06/24/2019,10:32 AM

## 2019-06-24 NOTE — Progress Notes (Signed)
Nsg Discharge Note  Admit Date:  06/22/2019 Discharge date: 06/24/2019   DOMINIKA KOSTAS to be D/C'd Home per MD order.  AVS completed.  Patient able to verbalize understanding.  Discharge Medication: Allergies as of 06/24/2019   No Known Allergies     Medication List    STOP taking these medications   aspirin 81 MG chewable tablet   valsartan 160 MG tablet Commonly known as: DIOVAN     TAKE these medications   alendronate 70 MG tablet Commonly known as: FOSAMAX Take 70 mg by mouth once a week. Take on Monday Take with a full glass of water on an empty stomach.   apixaban 5 MG Tabs tablet Commonly known as: ELIQUIS Take 1 tablet (5 mg total) by mouth 2 (two) times daily.   cholecalciferol 1000 units tablet Commonly known as: VITAMIN D Take 2,000 Units by mouth daily.   diltiazem 180 MG 24 hr capsule Commonly known as: CARDIZEM CD Take 1 capsule (180 mg total) by mouth daily. Start taking on: June 25, 2019   esomeprazole 20 MG capsule Commonly known as: NEXIUM Take 20 mg by mouth daily at 12 noon.   ibuprofen 200 MG tablet Commonly known as: ADVIL Take 200-400 mg by mouth every 6 (six) hours as needed.       Discharge Assessment: Vitals:   06/24/19 0456 06/24/19 1122  BP: 131/71 131/74  Pulse: 65   Resp: 16   Temp: 98.1 F (36.7 C)   SpO2: 96%    Skin clean, dry and intact without evidence of skin break down, no evidence of skin tears noted. IV catheter discontinued intact. Site without signs and symptoms of complications - no redness or edema noted at insertion site, patient denies c/o pain - only slight tenderness at site.  Dressing with slight pressure applied.  D/c Instructions-Education: Discharge instructions given to patient with verbalized understanding. D/c education completed with patient including follow up instructions, medication list, d/c activities limitations if indicated, with other d/c instructions as indicated by MD - patient  able to verbalize understanding, all questions fully answered. Patient instructed to return to ED, call 911, or call MD for any changes in condition.  Patient escorted via [place], and D/C home via private auto.  Seona Clemenson Loletha Grayer, RN 06/24/2019 1:47 PM

## 2019-06-24 NOTE — Progress Notes (Signed)
Subjective: She says she feels much better.  She has converted to sinus rhythm.  No complaints of chest pain or shortness of breath.  Objective: Vital signs in last 24 hours: Temp:  [98.1 F (36.7 C)-101.3 F (38.5 C)] 98.1 F (36.7 C) (11/16 0456) Pulse Rate:  [64-82] 65 (11/16 0456) Resp:  [16-20] 16 (11/16 0456) BP: (113-137)/(64-80) 131/71 (11/16 0456) SpO2:  [95 %-100 %] 96 % (11/16 0456) Weight change:  Last BM Date: 06/23/19  Intake/Output from previous day: 11/15 0701 - 11/16 0700 In: 148.7 [I.V.:148.7] Out: -   PHYSICAL EXAM General appearance: alert, cooperative and no distress Resp: clear to auscultation bilaterally Cardio: regular rate and rhythm, S1, S2 normal, no murmur, click, rub or gallop GI: soft, non-tender; bowel sounds normal; no masses,  no organomegaly Extremities: extremities normal, atraumatic, no cyanosis or edema  Lab Results:  Results for orders placed or performed during the hospital encounter of 06/22/19 (from the past 48 hour(s))  CBC with Differential     Status: None   Collection Time: 06/23/19 12:20 AM  Result Value Ref Range   WBC 6.1 4.0 - 10.5 K/uL   RBC 4.46 3.87 - 5.11 MIL/uL   Hemoglobin 12.7 12.0 - 15.0 g/dL   HCT 39.1 36.0 - 46.0 %   MCV 87.7 80.0 - 100.0 fL   MCH 28.5 26.0 - 34.0 pg   MCHC 32.5 30.0 - 36.0 g/dL   RDW 12.8 11.5 - 15.5 %   Platelets 274 150 - 400 K/uL   nRBC 0.0 0.0 - 0.2 %   Neutrophils Relative % 58 %   Neutro Abs 3.5 1.7 - 7.7 K/uL   Lymphocytes Relative 30 %   Lymphs Abs 1.8 0.7 - 4.0 K/uL   Monocytes Relative 8 %   Monocytes Absolute 0.5 0.1 - 1.0 K/uL   Eosinophils Relative 3 %   Eosinophils Absolute 0.2 0.0 - 0.5 K/uL   Basophils Relative 1 %   Basophils Absolute 0.1 0.0 - 0.1 K/uL   Immature Granulocytes 0 %   Abs Immature Granulocytes 0.01 0.00 - 0.07 K/uL    Comment: Performed at [facility], [address], [place], [place] [address]  Comprehensive metabolic panel     Status: Abnormal    Collection Time: 06/23/19 12:20 AM  Result Value Ref Range   Sodium 140 135 - 145 mmol/L   Potassium 3.7 3.5 - 5.1 mmol/L   Chloride 106 98 - 111 mmol/L   CO2 25 22 - 32 mmol/L   Glucose, Bld 117 (H) 70 - 99 mg/dL   BUN 19 8 - 23 mg/dL   Creatinine, Ser 0.66 0.44 - 1.00 mg/dL   Calcium 9.3 8.9 - 10.3 mg/dL   Total Protein 7.1 6.5 - 8.1 g/dL   Albumin 4.1 3.5 - 5.0 g/dL   AST 33 15 - 41 U/L   ALT 38 0 - 44 U/L   Alkaline Phosphatase 61 38 - 126 U/L   Total Bilirubin 0.6 0.3 - 1.2 mg/dL   GFR calc non Af Amer >60 >60 mL/min   GFR calc Af Amer >60 >60 mL/min   Anion gap 9 5 - 15    Comment: Performed at [facility], [address] [place], [place] [address]  Magnesium     Status: None   Collection Time: 06/23/19 12:28 AM  Result Value Ref Range   Magnesium 2.3 1.7 - 2.4 mg/dL    Comment: Performed at [facility], [address], [place], [place]  [address]  TSH     Status: None   Collection Time: 06/23/19 12:28 AM  Result Value Ref Range   TSH 1.605 0.350 - 4.500 uIU/mL    Comment: Performed by a 3rd Generation assay with a functional sensitivity of <=0.01 uIU/mL. Performed at [facility], [address] [place], [place] [address]   Urinalysis, Routine w reflex microscopic     Status: Abnormal   Collection Time: 06/23/19 12:42 AM  Result Value Ref Range   Color, Urine STRAW (A) YELLOW   APPearance CLEAR CLEAR   Specific Gravity, Urine 1.008 1.005 - 1.030   pH 6.0 5.0 - 8.0   Glucose, UA NEGATIVE NEGATIVE mg/dL   Hgb urine dipstick SMALL (A) NEGATIVE   Bilirubin Urine NEGATIVE NEGATIVE   Ketones, ur NEGATIVE NEGATIVE mg/dL   Protein, ur NEGATIVE NEGATIVE mg/dL   Nitrite NEGATIVE NEGATIVE   Leukocytes,Ua NEGATIVE NEGATIVE   RBC / HPF 0-5 0 - 5 RBC/hpf   WBC, UA 0-5 0 - 5 WBC/hpf   Bacteria, UA NONE SEEN NONE SEEN    Comment: Performed at [facility], [address], [place], [place] [address]  APTT     Status: None   Collection Time: 06/23/19  4:44 AM  Result Value Ref  Range   aPTT 27 24 - 36 seconds    Comment: Performed at [facility], [address] [place], [place] [address]  Protime-INR     Status: None   Collection Time: 06/23/19  4:44 AM  Result Value Ref Range   Prothrombin Time 13.1 11.4 - 15.2 seconds   INR 1.0 0.8 - 1.2    Comment: (NOTE) INR goal varies based on device and disease states. Performed at [facility], [address], [place], [place] [address]   HIV Antibody (routine testing w rflx)     Status: None   Collection Time: 06/23/19  4:45 AM  Result Value Ref Range   HIV Screen 4th Generation wRfx NON REACTIVE NON REACTIVE    Comment: Performed at [facility] [address] [place], [place] [address]  SARS CORONAVIRUS 2 (TAT 6-24 HRS) Nasopharyngeal Nasopharyngeal Swab     Status: None   Collection Time: 06/23/19  6:40 AM   Specimen: Nasopharyngeal Swab  Result Value Ref Range   SARS Coronavirus 2 NEGATIVE NEGATIVE    Comment: (NOTE) SARS-CoV-2 target nucleic acids are NOT DETECTED. The SARS-CoV-2 RNA is generally detectable in upper and lower respiratory specimens during the acute phase of infection. Negative results do not preclude SARS-CoV-2 infection, do not rule out co-infections with other pathogens, and should not be used as the sole basis for treatment or other patient management decisions. Negative results must be combined with clinical observations, patient history, and epidemiological information. The expected result is Negative. Fact Sheet for Patients: SugarRoll.be Fact Sheet for Healthcare Providers: https://www.woods-mathews.com/ This test is not yet approved or cleared by the [place] FDA and  has been authorized for detection and/or diagnosis of SARS-CoV-2 by FDA under an Emergency Use Authorization (EUA). This EUA will remain  in effect (meaning this test can be used) for the duration of the COVID-19 declaration under Section 56 4(b)(1) of the Act,  21 U.S.C. section 360bbb-3(b)(1), unless the authorization is terminated or revoked sooner. Performed at [facility], [place] [address], [place], [place] [address]   Heparin level (unfractionated)     Status: None   Collection Time: 06/23/19 12:16 PM  Result Value Ref Range  Heparin Unfractionated 0.50 0.30 - 0.70 IU/mL    Comment: (NOTE) If heparin results are below expected values, and patient dosage has  been confirmed, suggest follow up testing of antithrombin III levels. Performed at [facility], [address], [place], [place] [address]   Heparin level (unfractionated)     Status: Abnormal   Collection Time: 06/24/19  6:20 AM  Result Value Ref Range   Heparin Unfractionated 0.93 (H) 0.30 - 0.70 IU/mL    Comment: (NOTE) If heparin results are below expected values, and patient dosage has  been confirmed, suggest follow up testing of antithrombin III levels. Performed at [facility], [address] [place], [place] [id number]   Basic metabolic panel tomorrow     Status: Abnormal   Collection Time: 06/24/19  6:20 AM  Result Value Ref Range   Sodium 143 135 - 145 mmol/L   Potassium 3.7 3.5 - 5.1 mmol/L   Chloride 107 98 - 111 mmol/L   CO2 26 22 - 32 mmol/L   Glucose, Bld 105 (H) 70 - 99 mg/dL   BUN 15 8 - 23 mg/dL   Creatinine, Ser 0.56 0.44 - 1.00 mg/dL   Calcium 9.2 8.9 - 10.3 mg/dL   GFR calc non Af Amer >60 >60 mL/min   GFR calc Af Amer >60 >60 mL/min   Anion gap 10 5 - 15    Comment: Performed at [facility], [address], [place], [place] [address]    ABGS No results for input(s): PHART, PO2ART, TCO2, HCO3 in the last 72 hours.  Invalid input(s): PCO2 CULTURES Recent Results (from the past 240 hour(s))  SARS CORONAVIRUS 2 (TAT 6-24 HRS) Nasopharyngeal Nasopharyngeal Swab     Status: None   Collection Time: 06/23/19  6:40 AM   Specimen: Nasopharyngeal Swab  Result Value Ref Range Status   SARS Coronavirus 2 NEGATIVE NEGATIVE Final    Comment:  (NOTE) SARS-CoV-2 target nucleic acids are NOT DETECTED. The SARS-CoV-2 RNA is generally detectable in upper and lower respiratory specimens during the acute phase of infection. Negative results do not preclude SARS-CoV-2 infection, do not rule out co-infections with other pathogens, and should not be used as the sole basis for treatment or other patient management decisions. Negative results must be combined with clinical observations, patient history, and epidemiological information. The expected result is Negative. Fact Sheet for Patients: SugarRoll.be Fact Sheet for Healthcare Providers: https://www.woods-mathews.com/ This test is not yet approved or cleared by the [place] FDA and  has been authorized for detection and/or diagnosis of SARS-CoV-2 by FDA under an Emergency Use Authorization (EUA). This EUA will remain  in effect (meaning this test can be used) for the duration of the COVID-19 declaration under Section 56 4(b)(1) of the Act, 21 U.S.C. section 360bbb-3(b)(1), unless the authorization is terminated or revoked sooner. Performed at [facility], [place] [address] [place], [place] [address]    Studies/Results: No results found.  Medications:  Prior to Admission:  Medications Prior to Admission  Medication Sig Dispense Refill Last Dose  . alendronate (FOSAMAX) 70 MG tablet Take 70 mg by mouth once a week. Take on Monday Take with a full glass of water on an empty stomach.   06/17/2019  . aspirin 81 MG chewable tablet Chew 81 mg by mouth at bedtime.     06/22/2019 at 2000  . cholecalciferol (VITAMIN D) 1000 units tablet Take 2,000 Units by mouth daily.    06/22/2019 at 2000  . esomeprazole ([place])  20 MG capsule Take 20 mg by mouth daily at 12 noon.   06/22/2019 at 0800  . ibuprofen (ADVIL) 200 MG tablet Take 200-400 mg by mouth every 6 (six) hours as needed.   Past Month at Unknown time  . valsartan (DIOVAN) 160 MG  tablet Take 160 mg by mouth daily.   06/22/2019 at 2000   Scheduled: . diltiazem  30 mg Oral Q6H  . potassium chloride  40 mEq Oral Once  . sodium chloride flush  3 mL Intravenous Q12H   Continuous: . sodium chloride    . heparin 1,200 Units/hr (06/23/19 2323)   [id number] chloride, acetaminophen **OR** acetaminophen, ondansetron **OR** ondansetron (ZOFRAN) IV, oxyCODONE, sodium chloride flush  Assesment: She came to the hospital with atrial fib with RVR.  She has converted to sinus rhythm.  Echocardiogram showed mild left ventricular hypertrophy and otherwise essentially normal   At baseline she has hypertension and that is well controlled  Potassium was 3.7 and despite getting 40 mEq of potassium it is still 3.7 so we will repeat potassium replacement Active Problems:   Atrial fibrillation with RVR [facility])    Plan: Cardiology consultation    LOS: 1 day   Alonza Bogus 06/24/2019, 8:38 AM

## 2019-06-26 NOTE — Discharge Summary (Signed)
Physician Discharge Summary  Patient ID: Tara Brennan MRN: [id number] DOB/AGE: 1951/01/03 68 y.o. Primary Care Physician:Nikeya Maxim, Percell Miller, MD Admit date: 06/22/2019 Discharge date: 06/26/2019    Discharge Diagnoses:   Active Problems:   Atrial fibrillation with RVR (HCC) Hypertension Osteoarthritis Osteoporosis Gastroesophageal reflux disease  Allergies as of 06/24/2019   No Known Allergies     Medication List    STOP taking these medications   aspirin 81 MG chewable tablet   valsartan 160 MG tablet Commonly known as: DIOVAN     TAKE these medications   alendronate 70 MG tablet Commonly known as: FOSAMAX Take 70 mg by mouth once a week. Take on Monday Take with a full glass of water on an empty stomach.   apixaban 5 MG Tabs tablet Commonly known as: ELIQUIS Take 1 tablet (5 mg total) by mouth 2 (two) times daily.   cholecalciferol 1000 units tablet Commonly known as: VITAMIN D Take 2,000 Units by mouth daily.   diltiazem 180 MG 24 hr capsule Commonly known as: CARDIZEM CD Take 1 capsule (180 mg total) by mouth daily.   esomeprazole 20 MG capsule Commonly known as: NEXIUM Take 20 mg by mouth daily at 12 noon.   ibuprofen 200 MG tablet Commonly known as: ADVIL Take 200-400 mg by mouth every 6 (six) hours as needed.       Discharged Condition: Improved    Consults: Cardiology, Dr. Bronson Ing  Significant Diagnostic Studies: Dg Chest 2 View  Result Date: 06/24/2019 CLINICAL DATA:  68 year old female with fever. EXAM: CHEST - 2 VIEW COMPARISON:  Chest radiograph dated 03/02/2018. FINDINGS: There is no focal consolidation, pleural effusion, or pneumothorax. Left lung base linear atelectasis/scarring. The cardiac silhouette is within normal limits. Osteopenia with degenerative changes of the spine. Old lower thoracic compression fractures. No acute osseous pathology. IMPRESSION: No acute cardiopulmonary process. Electronically Signed   By: Anner Crete M.D.   On: 06/24/2019 12:23    Lab Results: Basic Metabolic Panel: Recent Labs    06/24/19 0620  NA 143  K 3.7  CL 107  CO2 26  GLUCOSE 105*  BUN 15  CREATININE 0.56  CALCIUM 9.2   Liver Function Tests: No results for input(s): AST, ALT, ALKPHOS, BILITOT, PROT, ALBUMIN in the last 72 hours.   CBC: No results for input(s): WBC, NEUTROABS, HGB, HCT, MCV, PLT in the last 72 hours.  Recent Results (from the past 240 hour(s))  SARS CORONAVIRUS 2 (TAT 6-24 HRS) Nasopharyngeal Nasopharyngeal Swab     Status: None   Collection Time: 06/23/19  6:40 AM   Specimen: Nasopharyngeal Swab  Result Value Ref Range Status   SARS Coronavirus 2 NEGATIVE NEGATIVE Final    Comment: (NOTE) SARS-CoV-2 target nucleic acids are NOT DETECTED. The SARS-CoV-2 RNA is generally detectable in upper and lower respiratory specimens during the acute phase of infection. Negative results do not preclude SARS-CoV-2 infection, do not rule out co-infections with other pathogens, and should not be used as the sole basis for treatment or other patient management decisions. Negative results must be combined with clinical observations, patient history, and epidemiological information. The expected result is Negative. Fact Sheet for Patients: SugarRoll.be Fact Sheet for Healthcare Providers: https://www.woods-mathews.com/ This test is not yet approved or cleared by the [place] FDA and  has been authorized for detection and/or diagnosis of SARS-CoV-2 by FDA under an Emergency Use Authorization (EUA). This EUA will remain  in effect (meaning this test can be used) for the duration of  the COVID-19 declaration under Section 56 4(b)(1) of the Act, 21 U.S.C. section 360bbb-3(b)(1), unless the authorization is terminated or revoked sooner. Performed at [facility], [place] [address] [place], [place] [address]      Hospital Course: This is a  68 year old who came to the emergency department because she had palpitations.  She had been working around her house and was chasing a Environmental education officer and she developed palpitations that were continuous.  She says she has had occasional episodes of palpitation that would go away in a few seconds but this did not go away.  She came to the emergency department she was found to be in atrial fib with RVR.  She was started initially on intravenous diltiazem and was switched to oral.  She was admitted to monitored bed.  She had echocardiogram.  She had cardiology consultation.  She converted to sinus rhythm.  Echocardiogram showed no wall motion abnormalities.  She was started on Eliquis was converted to Cardizem CD from short acting Cardizem and was ready for discharge.  Discharge Exam: Blood pressure 131/74, pulse 65, temperature 98.1 F (36.7 C), temperature source Oral, resp. rate 16, height 5\' 7"  (1.702 m), weight 83 kg, SpO2 96 %. She has regular rhythm.  Her chest is clear.  Her heart is regular.  Disposition: Home she will follow-up in my office and with cardiology    [place], [place], PA-C. Go on 07/11/2019.   Specialties: Physician Assistant, Cardiology Why: Thursday July 11, 2019 Cardiology Appointment with  [place] information: [place] [address] [phone]           Signed: Alonza Bogus

## 2019-07-01 ENCOUNTER — Other Ambulatory Visit: Payer: Self-pay

## 2019-07-11 ENCOUNTER — Encounter: Payer: Self-pay | Admitting: Student

## 2019-07-11 ENCOUNTER — Other Ambulatory Visit: Payer: Self-pay

## 2019-07-11 ENCOUNTER — Ambulatory Visit (INDEPENDENT_AMBULATORY_CARE_PROVIDER_SITE_OTHER): Payer: PPO | Admitting: Student

## 2019-07-11 VITALS — BP 132/78 | HR 80 | Temp 97.5°F | Ht 67.5 in | Wt 186.0 lb

## 2019-07-11 DIAGNOSIS — I480 Paroxysmal atrial fibrillation: Secondary | ICD-10-CM | POA: Diagnosis not present

## 2019-07-11 DIAGNOSIS — I10 Essential (primary) hypertension: Secondary | ICD-10-CM | POA: Diagnosis not present

## 2019-07-11 DIAGNOSIS — Z7901 Long term (current) use of anticoagulants: Secondary | ICD-10-CM

## 2019-07-11 MED ORDER — APIXABAN 5 MG PO TABS: 5.0000 mg | ORAL_TABLET | Freq: Two times a day (BID) | ORAL | 3 refills | Status: DC

## 2019-07-11 MED ORDER — DILTIAZEM HCL ER COATED BEADS 180 MG PO CP24: 180.0000 mg | ORAL_CAPSULE | Freq: Every day | ORAL | 3 refills | Status: DC

## 2019-07-11 NOTE — Patient Instructions (Addendum)
Medication Instructions:  Your physician recommends that you continue on your current medications as directed. Please refer to the Current Medication list given to you today. *If you need a refill on your cardiac medications before your next appointment, please call your pharmacy*  Lab Work: NONE  If you have labs (blood work) drawn today and your tests are completely normal, you will receive your results only by: Marland Kitchen MyChart Message (if you have MyChart) OR . A paper copy in the mail If you have any lab test that is abnormal or we need to change your treatment, we will call you to review the results.  Testing/Procedures: NONE   Follow-Up: At [facility], you and your health needs are our priority.  As part of our continuing mission to provide you with exceptional heart care, we have created designated Provider Care Teams.  These Care Teams include your primary Cardiologist (physician) and Advanced Practice Providers (APPs -  Physician Assistants and Nurse Practitioners) who all work together to provide you with the care you need, when you need it.  Your next appointment:   4-6 month(s)  The format for your next appointment:   In Person  Provider:   Kate Sable, MD  Other Instructions Thank you for choosing [facility]!    Two Gram Sodium Diet 2000 mg  What is Sodium? Sodium is a mineral found naturally in many foods. The most significant source of sodium in the diet is table salt, which is about 40% sodium.  Processed, convenience, and preserved foods also contain a large amount of sodium.  The body needs only 500 mg of sodium daily to function,  A normal diet provides more than enough sodium even if you do not use salt.  Why Limit Sodium? A build up of sodium in the body can cause thirst, increased blood pressure, shortness of breath, and water retention.  Decreasing sodium in the diet can reduce edema and risk of heart attack or stroke associated with high blood  pressure.  Keep in mind that there are many other factors involved in these health problems.  Heredity, obesity, lack of exercise, cigarette smoking, stress and what you eat all play a role.  General Guidelines:  Do not add salt at the table or in cooking.  One teaspoon of salt contains over 2 grams of sodium.  Read food labels  Avoid processed and convenience foods  Ask your dietitian before eating any foods not dicussed in the menu planning guidelines  Consult your physician if you wish to use a salt substitute or a sodium containing medication such as antacids.  Limit milk and milk products to 16 oz (2 cups) per day.  Shopping Hints:  READ LABELS!! "Dietetic" does not necessarily mean low sodium.  Salt and other sodium ingredients are often added to foods during processing.   Menu Planning Guidelines Food Group Choose More Often Avoid  Beverages (see also the milk group All fruit juices, low-sodium, salt-free vegetables juices, low-sodium carbonated beverages Regular vegetable or tomato juices, commercially softened water used for drinking or cooking  Breads and Cereals Enriched white, wheat, rye and pumpernickel bread, hard rolls and dinner rolls; muffins, cornbread and waffles; most dry cereals, cooked cereal without added salt; unsalted crackers and breadsticks; low sodium or homemade bread crumbs Bread, rolls and crackers with salted tops; quick breads; instant hot cereals; pancakes; commercial bread stuffing; self-rising flower and biscuit mixes; regular bread crumbs or cracker crumbs  Desserts and Sweets Desserts and sweets mad  with mild should be within allowance Instant pudding mixes and cake mixes  Fats Butter or margarine; vegetable oils; unsalted salad dressings, regular salad dressings limited to 1 Tbs; light, sour and heavy cream Regular salad dressings containing bacon fat, bacon bits, and salt pork; snack dips made with instant soup mixes or processed cheese; salted nuts   Fruits Most fresh, frozen and canned fruits Fruits processed with salt or sodium-containing ingredient (some dried fruits are processed with sodium sulfites        Vegetables Fresh, frozen vegetables and low- sodium canned vegetables Regular canned vegetables, sauerkraut, pickled vegetables, and others prepared in brine; frozen vegetables in sauces; vegetables seasoned with ham, bacon or salt pork  Condiments, Sauces, Miscellaneous  Salt substitute with physician's approval; pepper, herbs, spices; vinegar, lemon or lime juice; hot pepper sauce; garlic powder, onion powder, low sodium soy sauce (1 Tbs.); low sodium condiments (ketchup, chili sauce, mustard) in limited amounts (1 tsp.) fresh ground horseradish; unsalted tortilla chips, pretzels, potato chips, popcorn, salsa (1/4 cup) Any seasoning made with salt including garlic salt, celery salt, onion salt, and seasoned salt; sea salt, rock salt, kosher salt; meat tenderizers; monosodium glutamate; mustard, regular soy sauce, barbecue, sauce, chili sauce, teriyaki sauce, steak sauce, Worcestershire sauce, and most flavored vinegars; canned gravy and mixes; regular condiments; salted snack foods, olives, picles, relish, horseradish sauce, catsup   Food preparation: Try these seasonings Meats:    Pork Sage, onion Serve with applesauce  Chicken Poultry seasoning, thyme, parsley Serve with cranberry sauce  Lamb Curry powder, rosemary, garlic, thyme Serve with mint sauce or jelly  Veal Marjoram, basil Serve with current jelly, cranberry sauce  Beef Pepper, bay leaf Serve with dry mustard, unsalted chive butter  Fish [place] leaf, dill Serve with unsalted lemon butter, unsalted parsley butter  Vegetables:    Asparagus Lemon juice   Broccoli Lemon juice   Carrots Mustard dressing parsley, mint, nutmeg, glazed with unsalted butter and sugar   Green beans Marjoram, lemon juice, nutmeg,dill seed   Tomatoes Basil, marjoram, onion   Spice /blend for [employer]" 4 tsp ground thyme 1 tsp ground sage 3 tsp ground rosemary 4 tsp ground marjoram   Test your knowledge 1. A product that says "Salt Free" may still contain sodium. True or False 2. Garlic Powder and Hot Pepper Sauce an be used as alternative seasonings.True or False 3. Processed foods have more sodium than fresh foods.  True or False 4. Canned Vegetables have less sodium than froze True or False  WAYS TO DECREASE YOUR SODIUM INTAKE 1. Avoid the use of added salt in cooking and at the table.  Table salt (and other prepared seasonings which contain salt) is probably one of the greatest sources of sodium in the diet.  Unsalted foods can gain flavor from the sweet, sour, and butter taste sensations of herbs and spices.  Instead of using salt for seasoning, try the following seasonings with the foods listed.  Remember: how you use them to enhance natural food flavors is limited only by your creativity... Allspice-Meat, fish, eggs, fruit, peas, red and yellow vegetables Almond Extract-Fruit baked goods Anise Seed-Sweet breads, fruit, carrots, beets, cottage cheese, cookies (tastes like licorice) Basil-Meat, fish, eggs, vegetables, rice, vegetables salads, soups, sauces Bay Leaf-Meat, fish, stews, poultry Burnet-Salad, vegetables (cucumber-like flavor) Caraway Seed-Bread, cookies, cottage cheese, meat, vegetables, cheese, rice Cardamon-Baked goods, fruit, soups Celery Powder or seed-Salads, salad dressings, sauces, meatloaf, soup, bread.Do not use  celery salt Chervil-Meats, salads, fish, eggs,  vegetables, cottage cheese (parsley-like flavor) Chili Power-Meatloaf, chicken cheese, corn, eggplant, egg dishes Chives-Salads cottage cheese, egg dishes, soups, vegetables, sauces Cilantro-Salsa, casseroles Cinnamon-Baked goods, fruit, pork, lamb, chicken, carrots Cloves-Fruit, baked goods, fish, pot roast, green beans, beets, carrots Coriander-Pastry, cookies, meat, salads, cheese (lemon-orange  flavor) Cumin-Meatloaf, fish,cheese, eggs, cabbage,fruit pie (caraway flavor) [employer], fruit, eggs, fish, poultry, cottage cheese, vegetables Dill Seed-Meat, cottage cheese, poultry, vegetables, fish, salads, bread Fennel Seed-Bread, cookies, apples, pork, eggs, fish, beets, cabbage, cheese, Licorice-like flavor Garlic-(buds or powder) Salads, meat, poultry, fish, bread, butter, vegetables, potatoes.Do not  use garlic salt Ginger-Fruit, vegetables, baked goods, meat, fish, poultry Horseradish Root-Meet, vegetables, butter Lemon Juice or Extract-Vegetables, fruit, tea, baked goods, fish salads Mace-Baked goods fruit, vegetables, fish, poultry (taste like nutmeg) Maple Extract-Syrups Marjoram-Meat, chicken, fish, vegetables, breads, green salads (taste like Sage) Mint-Tea, lamb, sherbet, vegetables, desserts, carrots, cabbage Mustard, Dry or Seed-Cheese, eggs, meats, vegetables, poultry Nutmeg-Baked goods, fruit, chicken, eggs, vegetables, desserts Onion Powder-Meat, fish, poultry, vegetables, cheese, eggs, bread, rice salads (Do not use   Onion salt) Orange Extract-Desserts, baked goods Oregano-Pasta, eggs, cheese, onions, pork, lamb, fish, chicken, vegetables, green salads Paprika-Meat, fish, poultry, eggs, cheese, vegetables Parsley Flakes-Butter, vegetables, meat fish, poultry, eggs, bread, salads (certain forms may   Contain sodium Pepper-Meat fish, poultry, vegetables, eggs Peppermint Extract-Desserts, baked goods Poppy Seed-Eggs, bread, cheese, fruit dressings, baked goods, noodles, vegetables, cottage  [employer], poultry, meat, fish, cauliflower, turnips,eggs bread Saffron-Rice, bread, veal, chicken, fish, eggs Sage-Meat, fish, poultry, onions, eggplant, tomateos, pork, stews Savory-Eggs, salads, poultry, meat, rice, vegetables, soups, pork Tarragon-Meat, poultry, fish, eggs, butter, vegetables (licorice-like  flavor)  Thyme-Meat, poultry, fish, eggs, vegetables, (clover-like flavor), sauces, soups Tumeric-Salads, butter, eggs, fish, rice, vegetables (saffron-like flavor) Vanilla Extract-Baked goods, candy Vinegar-Salads, vegetables, meat marinades Walnut Extract-baked goods, candy

## 2019-07-11 NOTE — Progress Notes (Signed)
Cardiology Office Note    Date:  07/11/2019   ID:  Tara, Brennan 12-21-1950, MRN [id number]  PCP:  Tara Du, MD  Cardiologist: Tara Sable, MD    Chief Complaint  Patient presents with  . Hospitalization Follow-up    History of Present Illness:    Tara Brennan is a 68 y.o. female with past medical history of HTN and GERD who presents to the office today for hospital follow-up.  She most recently presented to [facility] on 06/22/2019 for evaluation of palpitations which had started earlier that evening while chasing a bug in her home. Initial EKG showed atrial fibrillation with RVR and she was started on IV Cardizem with conversion to normal sinus rhythm. Electrolytes and TSH were within normal limits. COVID testing negative.  Echocardiogram showed a preserved EF of 60 to 65% with no regional wall motion abnormalities. Given that she had converted to NSR, she was transitioned to PO Cardizem 30 mg every 6 hours which was transitioned to Cardizem CD 180 mg daily at discharge. She was started on Eliquis 5 mg twice daily for anticoagulation. ASA and Valsartan were discontinued at discharge given the initiation of anticoagulation and Cardizem CD.  In talking with the patient today, she reports overall doing well since her recent hospitalization.  She denies any recurrent palpitations. No recent chest pain, dyspnea on exertion, orthopnea, PND or lower extremity edema.  She reports good compliance with Eliquis and denies any evidence of active bleeding.  She has been checking her blood pressure at home and says this has overall been well controlled with SBP ranging from the 110's to 130's.   Past Medical History:  Diagnosis Date  . Anxiety   . Atrial fibrillation ([place])    a. diagnosed in 06/2019 with sponatenous conversion back to NSR  . GERD (gastroesophageal reflux disease)   . Osteopenia   . Ovarian cyst     Past Surgical History:  Procedure Laterality  Date  . ABDOMINAL HYSTERECTOMY  1984  . ESOPHAGOGASTRODUODENOSCOPY  06/28/2012   Procedure: ESOPHAGOGASTRODUODENOSCOPY (EGD);  Surgeon: Tara Columbia, MD;  Location: [facility] ENDOSCOPY;  Service: Endoscopy;  Laterality: N/A;  . OVARIAN CYST SURGERY    . PARTIAL HYSTERECTOMY      Current Medications: Outpatient Medications Prior to Visit  Medication Sig Dispense Refill  . acetaminophen (TYLENOL) 500 MG tablet Take 500 mg by mouth every 6 (six) hours as needed.    Marland Kitchen alendronate (FOSAMAX) 70 MG tablet Take 70 mg by mouth once a week. Take on Monday Take with a full glass of water on an empty stomach.    . cholecalciferol (VITAMIN D) 1000 units tablet Take 2,000 Units by mouth daily.     Marland Kitchen esomeprazole (NEXIUM) 20 MG capsule Take 20 mg by mouth daily at 12 noon.    Marland Kitchen apixaban (ELIQUIS) 5 MG TABS tablet Take 1 tablet (5 mg total) by mouth 2 (two) times daily. 60 tablet 5  . diltiazem (CARDIZEM CD) 180 MG 24 hr capsule Take 1 capsule (180 mg total) by mouth daily. 30 capsule 5  . ibuprofen (ADVIL) 200 MG tablet Take 200-400 mg by mouth every 6 (six) hours as needed.     No facility-administered medications prior to visit.      Allergies:   Patient has no known allergies.   Social History   Socioeconomic History  . Marital status: Married    Spouse name: Not on file  . Number of children: Not on  file  . Years of education: Not on file  . Highest education level: Not on file  Occupational History  . Not on file  Social Needs  . Financial resource strain: Not on file  . Food insecurity    Worry: Not on file    Inability: Not on file  . Transportation needs    Medical: Not on file    Non-medical: Not on file  Tobacco Use  . Smoking status: Never Smoker  . Smokeless tobacco: Never Used  Substance and Sexual Activity  . Alcohol use: No  . Drug use: No  . Sexual activity: Yes    Birth control/protection: Surgical  Lifestyle  . Physical activity    Days per week: Not on file     Minutes per session: Not on file  . Stress: Not on file  Relationships  . Social Herbalist on phone: Not on file    Gets together: Not on file    Attends religious service: Not on file    Active member of club or organization: Not on file    Attends meetings of clubs or organizations: Not on file    Relationship status: Not on file  Other Topics Concern  . Not on file  Social History Narrative  . Not on file     Family History:  The patient's family history includes Arthritis in an other family member; Cancer in her father; Diabetes in her sister; Hypertension in her mother.   Review of Systems:   Please see the history of present illness.     General:  No chills, fever, night sweats or weight changes.  Cardiovascular:  No chest pain, dyspnea on exertion, edema, orthopnea, palpitations, paroxysmal nocturnal dyspnea.  Dermatological: No rash, lesions/masses Respiratory: No cough, dyspnea Urologic: No hematuria, dysuria Abdominal:   No nausea, vomiting, diarrhea, bright red blood per rectum, melena, or hematemesis Neurologic:  No visual changes, wkns, changes in mental status.  She denies any of the above symptoms.   All other systems reviewed and are otherwise negative except as noted above.   Physical Exam:    VS:  BP 132/78   Pulse 80   Temp (!) 97.5 F (36.4 C)   Ht 5' 7.5" (1.715 m)   Wt 186 lb (84.4 kg)   SpO2 98%   BMI 28.70 kg/m    General: Well developed, well nourished,female appearing in no acute distress. Head: Normocephalic, atraumatic, sclera non-icteric, no xanthomas, nares are without discharge.  Neck: No carotid bruits. JVD not elevated.  Lungs: Respirations regular and unlabored, without wheezes or rales.  Heart: Regular rate and rhythm. No S3 or S4.  No murmur, no rubs, or gallops appreciated. Abdomen: Soft, non-tender, non-distended with normoactive bowel sounds. No hepatomegaly. No rebound/guarding. No obvious abdominal masses. Msk:   Strength and tone appear normal for age. No joint deformities or effusions. Extremities: No clubbing or cyanosis. No lower extremity edema.  Distal pedal pulses are 2+ bilaterally. Neuro: Alert and oriented X 3. Moves all extremities spontaneously. No focal deficits noted. Psych:  Responds to questions appropriately with a normal affect. Skin: No rashes or lesions noted  Wt Readings from Last 3 Encounters:  07/11/19 186 lb (84.4 kg)  06/22/19 183 lb (83 kg)  11/30/17 180 lb 12.4 oz (82 kg)     Studies/Labs Reviewed:   EKG:  EKG is not ordered today.    Recent Labs: 06/23/2019: ALT 38; Hemoglobin 12.7; Magnesium 2.3; Platelets 274; TSH 1.605  06/24/2019: BUN 15; Creatinine, Ser 0.56; Potassium 3.7; Sodium 143   Lipid Panel No results found for: CHOL, TRIG, HDL, CHOLHDL, VLDL, LDLCALC, LDLDIRECT  Additional studies/ records that were reviewed today include:   Echocardiogram: 06/23/2019 IMPRESSIONS    1. Left ventricular ejection fraction, by visual estimation, is 60 to 65%. The left ventricle has normal function. There is mildly increased left ventricular hypertrophy.  2. Left ventricular diastolic function could not be evaluated.  3. The left ventricle has no regional wall motion abnormalities.  4. Global right ventricle has normal systolic function.The right ventricular size is normal. No increase in right ventricular wall thickness.  5. Left atrial size was normal.  6. Right atrial size was normal.  7. The mitral valve is grossly normal. Mild mitral valve regurgitation.  8. The tricuspid valve is grossly normal. Tricuspid valve regurgitation is trivial.  9. The aortic valve is tricuspid. Aortic valve regurgitation is not visualized. No evidence of aortic valve sclerosis or stenosis. 10. The pulmonic valve was grossly normal. Pulmonic valve regurgitation is not visualized. 11. The inferior vena cava is normal in size with greater than 50% respiratory variability, suggesting  right atrial pressure of 3 mmHg.  Assessment:    1. PAF (paroxysmal atrial fibrillation) ([place])   2. Current use of long term anticoagulation   3. Essential hypertension      Plan:   In order of problems listed above:  1. Paroxysmal Atrial Fibrillation/ Use of Long-Term Anticoagulation - She denies any recent palpitations and spontaneously converted back to normal sinus rhythm while on IV Cardizem last admission. She is maintaining normal sinus rhythm by examination today. - Will continue Cardizem CD 180 mg daily for rate control. - I did recommend that she continue with anticoagulation at this time since there was not an identifiable trigger for her arrhythmia and her CHA2DS2-VASc Score of (Age, HTN, Female) of 3. She does report a strong family history of atrial fibrillation in her mother and sister.  Will continue Eliquis 5 mg twice daily.  2. HTN - BP is well controlled at 132/78 during today's visit.  Continue Cardizem CD 180 mg daily.  Medication Adjustments/Labs and Tests Ordered: Current medicines are reviewed at length with the patient today.  Concerns regarding medicines are outlined above.  Medication changes, Labs and Tests ordered today are listed in the Patient Instructions below. Patient Instructions   Medication Instructions:  Your physician recommends that you continue on your current medications as directed. Please refer to the Current Medication list given to you today. *If you need a refill on your cardiac medications before your next appointment, please call your pharmacy*  Lab Work: NONE  If you have labs (blood work) drawn today and your tests are completely normal, you will receive your results only by: Marland Kitchen MyChart Message (if you have MyChart) OR . A paper copy in the mail If you have any lab test that is abnormal or we need to change your treatment, we will call you to review the results.  Testing/Procedures: NONE   Follow-Up: At [facility], you and  your health needs are our priority.  As part of our continuing mission to provide you with exceptional heart care, we have created designated Provider Care Teams.  These Care Teams include your primary Cardiologist (physician) and Advanced Practice Providers (APPs -  Physician Assistants and Nurse Practitioners) who all work together to provide you with the care you need, when you need it.  Your next appointment:  4-6 month(s)  The format for your next appointment:   In Person  Provider:   Kate Sable, MD  Other Instructions Thank you for choosing [facility]!   Signed, Erma Heritage, PA-C  07/11/2019 5:01 PM    [facility] [place], [place] [address] Phone: [phone] Fax: [phone]

## 2019-08-27 ENCOUNTER — Other Ambulatory Visit (HOSPITAL_COMMUNITY): Payer: Self-pay | Admitting: Obstetrics & Gynecology

## 2019-08-27 DIAGNOSIS — Z1231 Encounter for screening mammogram for malignant neoplasm of breast: Secondary | ICD-10-CM

## 2019-09-06 ENCOUNTER — Other Ambulatory Visit (HOSPITAL_COMMUNITY): Payer: Self-pay | Admitting: Family Medicine

## 2019-09-06 ENCOUNTER — Other Ambulatory Visit: Payer: Self-pay

## 2019-09-06 ENCOUNTER — Ambulatory Visit (HOSPITAL_COMMUNITY)
Admission: RE | Admit: 2019-09-06 | Discharge: 2019-09-06 | Disposition: A | Discharge status: Home / Self Care | Payer: PPO | Source: Ambulatory Visit | Attending: Obstetrics & Gynecology | Admitting: Obstetrics & Gynecology

## 2019-09-06 DIAGNOSIS — Z1231 Encounter for screening mammogram for malignant neoplasm of breast: Secondary | ICD-10-CM | POA: Insufficient documentation

## 2019-10-04 ENCOUNTER — Ambulatory Visit (INDEPENDENT_AMBULATORY_CARE_PROVIDER_SITE_OTHER): Payer: PPO | Admitting: Family Medicine

## 2019-10-04 ENCOUNTER — Other Ambulatory Visit: Payer: Self-pay

## 2019-10-04 ENCOUNTER — Encounter: Payer: Self-pay | Admitting: Family Medicine

## 2019-10-04 VITALS — BP 138/86 | HR 70 | Temp 97.3°F | Resp 16 | Ht 67.5 in | Wt 191.0 lb

## 2019-10-04 DIAGNOSIS — I480 Paroxysmal atrial fibrillation: Secondary | ICD-10-CM | POA: Diagnosis not present

## 2019-10-04 DIAGNOSIS — M8580 Other specified disorders of bone density and structure, unspecified site: Secondary | ICD-10-CM | POA: Diagnosis not present

## 2019-10-04 DIAGNOSIS — M810 Age-related osteoporosis without current pathological fracture: Secondary | ICD-10-CM | POA: Diagnosis not present

## 2019-10-04 DIAGNOSIS — Z23 Encounter for immunization: Secondary | ICD-10-CM | POA: Diagnosis not present

## 2019-10-04 NOTE — Addendum Note (Signed)
Addended by: Shary Decamp B on: 10/04/2019 10:14 AM   Modules accepted: Orders

## 2019-10-04 NOTE — Progress Notes (Signed)
Subjective:    Patient ID: Tara Brennan, female    DOB: 03-May-1951, 69 y.o.   MRN: [id number]  HPI  Patient is a very pleasant 69 year old Caucasian female who presents today for a checkup.  Last November she had an episode of atrial fibrillation.  She is uncertain as to what caused it.  She went into rapid ventricular response and went to the emergency room.  After receiving IV Cardizem, she spontaneously converted back to normal sinus rhythm and she remains in normal sinus rhythm today.  She continues to take Cardizem.  She denies any chest pain shortness of breath or dyspnea on exertion.  She denies any syncope or near syncope.  She remains on Eliquis as an anticoagulant.  We discussed the rationale behind remaining on Eliquis for primary prevention of stroke.  The patient denies any bleeding or bruising.  She has a documented history of osteopenia however I reviewed her bone density test from last year.  Patient has severe osteoporosis.  In 2008 she had a T score of -4.4 in her spine.  This is improved on Fosamax to -3.6.  The patient has been taking Fosamax for more than 10 years.  She has never had a therapeutic break.  However her bone density has worsened over the last 4 years in her left hip.  She went from osteopenia with a T score of -2.3.osteoporosis with a T score of -2.6 in her left hip.  Therefore after long discussion we have elected for her to remain on Fosamax.  She is taking vitamin D but she is not taking any calcium supplements.  Regarding her preventative care, her colonoscopy was performed last year and is up-to-date.  She is due for Prevnar 13.  Past Medical History:  Diagnosis Date  . Anxiety   . Atrial fibrillation ([place])    a. diagnosed in 06/2019 with sponatenous conversion back to NSR  . GERD (gastroesophageal reflux disease)   . Osteopenia   . Ovarian cyst      Past Surgical History:  Procedure Laterality Date  . ABDOMINAL HYSTERECTOMY  1984  .  ESOPHAGOGASTRODUODENOSCOPY  06/28/2012   Procedure: ESOPHAGOGASTRODUODENOSCOPY (EGD);  Surgeon: Jeryl Columbia, MD;  Location: [facility] ENDOSCOPY;  Service: Endoscopy;  Laterality: N/A;  . OVARIAN CYST SURGERY    . PARTIAL HYSTERECTOMY     Current Outpatient Medications on File Prior to Visit  Medication Sig Dispense Refill  . acetaminophen (TYLENOL) 500 MG tablet Take 500 mg by mouth every 6 (six) hours as needed.    Marland Kitchen alendronate (FOSAMAX) 70 MG tablet Take 70 mg by mouth once a week. Take on Monday Take with a full glass of water on an empty stomach.    Marland Kitchen apixaban (ELIQUIS) 5 MG TABS tablet Take 1 tablet (5 mg total) by mouth 2 (two) times daily. 180 tablet 3  . cholecalciferol (VITAMIN D) 1000 units tablet Take 2,000 Units by mouth daily.     Marland Kitchen diltiazem (CARDIZEM CD) 180 MG 24 hr capsule Take 1 capsule (180 mg total) by mouth daily. 90 capsule 3  . esomeprazole (NEXIUM) 20 MG capsule Take 20 mg by mouth daily at 12 noon.     No current facility-administered medications on file prior to visit.   No Known Allergies Social History   Socioeconomic History  . Marital status: Married    Spouse name: Not on file  . Number of children: Not on file  . Years of education: Not on file  .  Highest education level: Not on file  Occupational History  . Not on file  Tobacco Use  . Smoking status: Never Smoker  . Smokeless tobacco: Never Used  Substance and Sexual Activity  . Alcohol use: No  . Drug use: No  . Sexual activity: Yes    Birth control/protection: Surgical  Other Topics Concern  . Not on file  Social History Narrative  . Not on file   Social Determinants of Health   Financial Resource Strain:   . Difficulty of Paying Living Expenses: Not on file  Food Insecurity:   . Worried About Charity fundraiser in the Last Year: Not on file  . Ran Out of Food in the Last Year: Not on file  Transportation Needs:   . Lack of Transportation (Medical): Not on file  . Lack of Transportation  (Non-Medical): Not on file  Physical Activity:   . Days of Exercise per Week: Not on file  . Minutes of Exercise per Session: Not on file  Stress:   . Feeling of Stress : Not on file  Social Connections:   . Frequency of Communication with Friends and Family: Not on file  . Frequency of Social Gatherings with Friends and Family: Not on file  . Attends Religious Services: Not on file  . Active Member of Clubs or Organizations: Not on file  . Attends Archivist Meetings: Not on file  . Marital Status: Not on file  Intimate Partner Violence:   . Fear of Current or Ex-Partner: Not on file  . Emotionally Abused: Not on file  . Physically Abused: Not on file  . Sexually Abused: Not on file    Review of Systems     Objective:   Physical Exam Vitals reviewed.  Constitutional:      Appearance: She is normal weight.  Cardiovascular:     Rate and Rhythm: Normal rate and regular rhythm.     Pulses: Normal pulses.     Heart sounds: Normal heart sounds. No murmur. No friction rub. No gallop.   Pulmonary:     Effort: Pulmonary effort is normal. No respiratory distress.     Breath sounds: Normal breath sounds. No wheezing, rhonchi or rales.  Abdominal:     General: Abdomen is flat. Bowel sounds are normal. There is no distension.     Palpations: Abdomen is soft. There is no mass.     Tenderness: There is no abdominal tenderness. There is no guarding.     Hernia: No hernia is present.  Musculoskeletal:     Right lower leg: No edema.     Left lower leg: No edema.  Neurological:     Mental Status: She is alert.           Assessment & Plan:  Paroxysmal atrial fibrillation ([place]) - Plan: CBC with Differential/Platelet, COMPLETE METABOLIC PANEL WITH GFR, Lipid panel  Osteoporosis of femur without pathological fracture - Plan: VITAMIN D 25 Hydroxy (Vit-D Deficiency, Fractures)  Patient is currently in normal sinus rhythm.  Her heart rate is well controlled on Cardizem.  She  denies any hypotension or bradycardia or lightheadedness or syncope.  I will make no changes in her Cardizem or her Eliquis.  I will check a CBC to monitor for any anemia or evidence of blood loss.  I will also monitor her renal function with a CMP as well as checking a fasting lipid panel to screen for hyperlipidemia.  The patient has osteoporosis which is actually  worsened in her femur over the last 4 years.  Therefore I will continue the patient on Fosamax at the present time.  I will check a vitamin D level.  I also encouraged the patient to take 1200 mg a day of calcium.  The patient received Prevnar 13 today.  She should get Pneumovax 23 next year.  She declined HIV and hepatitis C screening.

## 2019-10-05 LAB — CBC WITH DIFFERENTIAL/PLATELET
Absolute Monocytes: 510 cells/uL (ref 200–950)
Basophils Absolute: 42 cells/uL (ref 0–200)
Basophils Relative: 0.7 %
Eosinophils Absolute: 120 cells/uL (ref 15–500)
Eosinophils Relative: 2 %
HCT: 39 % (ref 35.0–45.0)
Hemoglobin: 12.8 g/dL (ref 11.7–15.5)
Lymphs Abs: 1470 cells/uL (ref 850–3900)
MCH: 27.9 pg (ref 27.0–33.0)
MCHC: 32.8 g/dL (ref 32.0–36.0)
MCV: 85.2 fL (ref 80.0–100.0)
MPV: 11.9 fL (ref 7.5–12.5)
Monocytes Relative: 8.5 %
Neutro Abs: 3858 cells/uL (ref 1500–7800)
Neutrophils Relative %: 64.3 %
Platelets: 282 10*3/uL (ref 140–400)
RBC: 4.58 10*6/uL (ref 3.80–5.10)
RDW: 12.9 % (ref 11.0–15.0)
Total Lymphocyte: 24.5 %
WBC: 6 10*3/uL (ref 3.8–10.8)

## 2019-10-05 LAB — COMPLETE METABOLIC PANEL WITH GFR
AG Ratio: 1.7 (calc) (ref 1.0–2.5)
ALT: 24 U/L (ref 6–29)
AST: 22 U/L (ref 10–35)
Albumin: 4.3 g/dL (ref 3.6–5.1)
Alkaline phosphatase (APISO): 77 U/L (ref 37–153)
BUN: 22 mg/dL (ref 7–25)
CO2: 26 mmol/L (ref 20–32)
Calcium: 9.7 mg/dL (ref 8.6–10.4)
Chloride: 103 mmol/L (ref 98–110)
Creat: 0.65 mg/dL (ref 0.50–0.99)
GFR, Est African American: 106 mL/min/{1.73_m2} (ref >=60)
GFR, Est Non African American: 92 mL/min/{1.73_m2} (ref >=60)
Globulin: 2.5 g/dL (calc) (ref 1.9–3.7)
Glucose, Bld: 104 mg/dL — ABNORMAL HIGH (ref 65–99)
Potassium: 4.7 mmol/L (ref 3.5–5.3)
Sodium: 139 mmol/L (ref 135–146)
Total Bilirubin: 0.5 mg/dL (ref 0.2–1.2)
Total Protein: 6.8 g/dL (ref 6.1–8.1)

## 2019-10-05 LAB — LIPID PANEL
Cholesterol: 206 mg/dL — ABNORMAL HIGH (ref <200)
HDL: 60 mg/dL (ref >=50)
LDL Cholesterol (Calc): 131 mg/dL (calc) — ABNORMAL HIGH
Non-HDL Cholesterol (Calc): 146 mg/dL (calc) — ABNORMAL HIGH (ref <130)
Total CHOL/HDL Ratio: 3.4 (calc) (ref <5.0)
Triglycerides: 63 mg/dL (ref <150)

## 2019-10-05 LAB — VITAMIN D 25 HYDROXY (VIT D DEFICIENCY, FRACTURES): Vit D, 25-Hydroxy: 28 ng/mL — ABNORMAL LOW (ref 30–100)

## 2019-10-08 MED ORDER — VITAMIN D (ERGOCALCIFEROL) 1.25 MG (50000 UNIT) PO CAPS: 50000.0000 [IU] | ORAL_CAPSULE | ORAL | 1 refills | Status: DC

## 2019-10-24 DIAGNOSIS — H00012 Hordeolum externum right lower eyelid: Secondary | ICD-10-CM | POA: Diagnosis not present

## 2019-10-24 DIAGNOSIS — H1045 Other chronic allergic conjunctivitis: Secondary | ICD-10-CM | POA: Diagnosis not present

## 2019-11-18 ENCOUNTER — Encounter: Payer: Self-pay | Admitting: Cardiovascular Disease

## 2019-11-18 ENCOUNTER — Other Ambulatory Visit: Payer: Self-pay

## 2019-11-18 ENCOUNTER — Ambulatory Visit: Payer: PPO | Admitting: Cardiovascular Disease

## 2019-11-18 VITALS — BP 132/72 | HR 77 | Ht 67.5 in

## 2019-11-18 DIAGNOSIS — I10 Essential (primary) hypertension: Secondary | ICD-10-CM | POA: Diagnosis not present

## 2019-11-18 DIAGNOSIS — I480 Paroxysmal atrial fibrillation: Secondary | ICD-10-CM

## 2019-11-18 DIAGNOSIS — Z7901 Long term (current) use of anticoagulants: Secondary | ICD-10-CM

## 2019-11-18 NOTE — Patient Instructions (Signed)
Medication Instructions:  Your physician recommends that you continue on your current medications as directed. Please refer to the Current Medication list given to you today.  *If you need a refill on your cardiac medications before your next appointment, please call your pharmacy*   Lab Work: None today If you have labs (blood work) drawn today and your tests are completely normal, you will receive your results only by: Marland Kitchen MyChart Message (if you have MyChart) OR . A paper copy in the mail If you have any lab test that is abnormal or we need to change your treatment, we will call you to review the results.   Testing/Procedures: None today   Follow-Up: At [facility], you and your health needs are our priority.  As part of our continuing mission to provide you with exceptional heart care, we have created designated Provider Care Teams.  These Care Teams include your primary Cardiologist (physician) and Advanced Practice Providers (APPs -  Physician Assistants and Nurse Practitioners) who all work together to provide you with the care you need, when you need it.  We recommend signing up for the patient portal called "MyChart".  Sign up information is provided on this After Visit Summary.  MyChart is used to connect with patients for Virtual Visits (Telemedicine).  Patients are able to view lab/test results, encounter notes, upcoming appointments, etc.  Non-urgent messages can be sent to your provider as well.   To learn more about what you can do with MyChart, go to [email or web address].    Your next appointment:   6 month(s)  The format for your next appointment:   In Person  Provider:   Kate Sable, MD   Other Instructions None

## 2019-11-18 NOTE — Progress Notes (Signed)
SUBJECTIVE: Tara Brennan presents for follow-up for paroxysmal atrial fibrillation.  The patient denies any symptoms of chest pain, palpitations, shortness of breath, lightheadedness, dizziness, leg swelling, orthopnea, PND, and syncope.  She denies any bleeding problems with Eliquis.  She stays active around her yard mowing the lawn and laying out mulch without any difficulty.    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  Review of Systems: As per "subjective", otherwise negative.  No Known Allergies  Current Outpatient Medications  Medication Sig Dispense Refill  . acetaminophen (TYLENOL) 500 MG tablet Take 500 mg by mouth every 6 (six) hours as needed.    Marland Kitchen alendronate (FOSAMAX) 70 MG tablet Take 70 mg by mouth once a week. Take on Monday Take with a full glass of water on an empty stomach.    Marland Kitchen apixaban (ELIQUIS) 5 MG TABS tablet Take 1 tablet (5 mg total) by mouth 2 (two) times daily. 180 tablet 3  . cholecalciferol (VITAMIN D) 1000 units tablet Take 2,000 Units by mouth daily.     Marland Kitchen diltiazem (CARDIZEM CD) 180 MG 24 hr capsule Take 1 capsule (180 mg total) by mouth daily. 90 capsule 3  . esomeprazole (NEXIUM) 20 MG capsule Take 20 mg by mouth daily at 12 noon.    . Vitamin D, Ergocalciferol, (DRISDOL) 1.25 MG (50000 UNIT) CAPS capsule Take 1 capsule (50,000 Units total) by mouth every 7 (seven) days. 15 capsule 1   No current facility-administered medications for this visit.    Past Medical History:  Diagnosis Date  . Anxiety   . Atrial fibrillation ([place])    a. diagnosed in 06/2019 with sponatenous conversion back to NSR  . GERD (gastroesophageal reflux disease)   . Osteopenia   . Ovarian cyst     Past Surgical History:  Procedure Laterality Date  . ABDOMINAL HYSTERECTOMY  1984  . ESOPHAGOGASTRODUODENOSCOPY  06/28/2012   Procedure: ESOPHAGOGASTRODUODENOSCOPY (EGD);  Surgeon: Jeryl Columbia, MD;  Location: [facility] ENDOSCOPY;  Service: Endoscopy;  Laterality: N/A;  . OVARIAN CYST SURGERY    . PARTIAL HYSTERECTOMY      Social History   Socioeconomic History  . Marital status: Married    Spouse name: Not on file  . Number of children: Not on file  . Years of education: Not on file  . Highest education level: Not on file  Occupational History  . Not on file  Tobacco Use  . Smoking status: Never Smoker  . Smokeless tobacco: Never Used  Substance and Sexual Activity  . Alcohol use: No  . Drug use: No  . Sexual activity: Yes    Birth control/protection: Surgical  Other Topics Concern  . Not on file  Social History Narrative  . Not on file   Social Determinants of Health   Financial Resource Strain:   . Difficulty of Paying Living Expenses:   Food Insecurity:   . Worried About Charity fundraiser in the Last Year:   . Arboriculturist in the Last Year:   Transportation Needs:   . Film/video editor (Medical):   Marland Kitchen Lack of Transportation (Non-Medical):   Physical Activity:   . Days of Exercise per Week:   . Minutes of Exercise per Session:   Stress:   . Feeling of Stress :   Social Connections:   . Frequency of Communication with Friends and Family:   .  Frequency of Social Gatherings with Friends and Family:   . Attends Religious Services:   . Active Member of Clubs or Organizations:   . Attends Archivist Meetings:   Marland Kitchen Marital Status:   Intimate Partner Violence:   . Fear of Current or Ex-Partner:   . Emotionally Abused:   Marland Kitchen Physically Abused:   . Sexually Abused:     Barbarann Ehlers, RN was present throughout the entirety of the encounter.  Vitals:   11/18/19 0810  BP: 132/72  Pulse: 77  SpO2: 95%  Height: 5' 7.5" (1.715 m)    Wt Readings from Last 3 Encounters:  10/04/19 191 lb (86.6 kg)  07/11/19 186 lb (84.4 kg)  06/22/19 183 lb (83 kg)     PHYSICAL EXAM General: NAD HEENT: Normal. Neck: No JVD, no  thyromegaly. Lungs: Clear to auscultation bilaterally with normal respiratory effort. CV: Regular rate and rhythm, normal S1/S2, no S3/S4, no murmur. No pretibial or periankle edema.  No carotid bruit.   Abdomen: Soft, nontender, no distention.  Neurologic: Alert and oriented.  Psych: Normal affect. Skin: Normal. Musculoskeletal: No gross deformities.      Labs: Lab Results  Component Value Date/Time   K 4.7 10/04/2019 08:25 AM   BUN 22 10/04/2019 08:25 AM   CREATININE 0.65 10/04/2019 08:25 AM   ALT 24 10/04/2019 08:25 AM   TSH 1.605 06/23/2019 12:28 AM   HGB 12.8 10/04/2019 08:25 AM     Lipids: Lab Results  Component Value Date/Time   LDLCALC 131 (H) 10/04/2019 08:25 AM   CHOL 206 (H) 10/04/2019 08:25 AM   TRIG 63 10/04/2019 08:25 AM   HDL 60 10/04/2019 08:25 AM      Echocardiogram: 06/23/2019 IMPRESSIONS  1. Left ventricular ejection fraction, by visual estimation, is 60 to 65%. The left ventricle has normal function. There is mildly increased left ventricular hypertrophy. 2. Left ventricular diastolic function could not be evaluated. 3. The left ventricle has no regional wall motion abnormalities. 4. Global right ventricle has normal systolic function.The right ventricular size is normal. No increase in right ventricular wall thickness. 5. Left atrial size was normal. 6. Right atrial size was normal. 7. The mitral valve is grossly normal. Mild mitral valve regurgitation. 8. The tricuspid valve is grossly normal. Tricuspid valve regurgitation is trivial. 9. The aortic valve is tricuspid. Aortic valve regurgitation is not visualized. No evidence of aortic valve sclerosis or stenosis. 10. The pulmonic valve was grossly normal. Pulmonic valve regurgitation is not visualized. 11. The inferior vena cava is normal in size with greater than 50% respiratory variability, suggesting right atrial pressure of 3 mmHg.   ASSESSMENT AND PLAN:  1.  Paroxysmal atrial  fibrillation: Symptomatically stable on Cardizem CD 180 mg daily.  Continue systemic anticoagulation with Eliquis 5 mg twice daily.  2.  Hypertension: Blood pressures controlled on present therapy.  No changes.   Disposition: Follow up 6 months   Kate Sable, M.D., F.A.C.C.

## 2020-01-20 ENCOUNTER — Other Ambulatory Visit: Payer: Self-pay | Admitting: *Deleted

## 2020-01-20 DIAGNOSIS — I83893 Varicose veins of bilateral lower extremities with other complications: Secondary | ICD-10-CM

## 2020-01-23 ENCOUNTER — Other Ambulatory Visit: Payer: Self-pay | Admitting: Family Medicine

## 2020-01-23 ENCOUNTER — Ambulatory Visit: Payer: PPO | Admitting: Vascular Surgery

## 2020-01-23 ENCOUNTER — Other Ambulatory Visit: Payer: Self-pay

## 2020-01-23 ENCOUNTER — Ambulatory Visit (HOSPITAL_COMMUNITY)
Admission: RE | Admit: 2020-01-23 | Discharge: 2020-01-23 | Disposition: A | Discharge status: Home / Self Care | Payer: PPO | Source: Ambulatory Visit | Attending: Vascular Surgery | Admitting: Vascular Surgery

## 2020-01-23 ENCOUNTER — Encounter: Payer: Self-pay | Admitting: Vascular Surgery

## 2020-01-23 VITALS — BP 129/79 | HR 68 | Temp 97.2°F | Resp 16 | Ht 67.5 in | Wt 190.0 lb

## 2020-01-23 DIAGNOSIS — I83893 Varicose veins of bilateral lower extremities with other complications: Secondary | ICD-10-CM | POA: Diagnosis not present

## 2020-01-23 DIAGNOSIS — I83813 Varicose veins of bilateral lower extremities with pain: Secondary | ICD-10-CM | POA: Diagnosis not present

## 2020-01-23 DIAGNOSIS — I872 Venous insufficiency (chronic) (peripheral): Secondary | ICD-10-CM

## 2020-01-23 NOTE — Progress Notes (Signed)
REASON FOR CONSULT:    Varicose veins bilaterally.  The patient is self-referred.  ASSESSMENT & PLAN:   CHRONIC VENOUS INSUFFICIENCY: This patient has deep venous reflux bilaterally and some superficial venous reflux in the left leg involving the anterior sensory saphenous vein.  We have discussed the importance of intermittent leg elevation and the proper positioning for this.  In addition I encouraged her to wear knee-high compression stockings with a gradient of 15 to 20 mmHg.  I encouraged her to avoid prolonged sitting and standing.  We discussed the importance of exercise specifically walking and water aerobics.  We also discussed the importance of maintaining a healthy weight.  If her symptoms or varicose veins progress in the future we could reevaluate her.  If she develops large varicose veins branching off the anterior sensory saphenous vein she may be a candidate for laser ablation.  She will call if her symptoms progress.  Tara Mayo, MD Office: [phone]   HPI:   Tara Brennan is a pleasant 69 y.o. female, who presents for evaluation of varicose veins bilaterally.  She has had varicose veins for many years.  Recently she has had increasing varicosities on the left side.  Of note she is undergone multiple previous treatments at [facility].  She had sclerotherapy in both legs.  She had endovenous laser ablation of the right great saphenous vein and subsequently endovenous laser ablation of the right small saphenous vein.  She describes some aching pain in her legs associated with standing and sitting and relieved with elevation.  She has not been wearing her compression stockings recently.  She has had no previous history of DVT.  She is on Eliquis for atrial fibrillation.  Past Medical History:  Diagnosis Date  . Anxiety   . Atrial fibrillation ([place])    a. diagnosed in 06/2019 with sponatenous conversion back to NSR  . GERD (gastroesophageal reflux disease)     . Osteopenia   . Ovarian cyst     Family History  Problem Relation Age of Onset  . Hypertension Mother   . Cancer Father   . Diabetes Sister   . Arthritis Other     SOCIAL HISTORY: Social History   Socioeconomic History  . Marital status: Married    Spouse name: Not on file  . Number of children: Not on file  . Years of education: Not on file  . Highest education level: Not on file  Occupational History  . Not on file  Tobacco Use  . Smoking status: Never Smoker  . Smokeless tobacco: Never Used  Vaping Use  . Vaping Use: Never used  Substance and Sexual Activity  . Alcohol use: No  . Drug use: No  . Sexual activity: Yes    Birth control/protection: Surgical  Other Topics Concern  . Not on file  Social History Narrative  . Not on file   Social Determinants of Health   Financial Resource Strain:   . Difficulty of Paying Living Expenses:   Food Insecurity:   . Worried About Charity fundraiser in the Last Year:   . Arboriculturist in the Last Year:   Transportation Needs:   . Film/video editor (Medical):   Marland Kitchen Lack of Transportation (Non-Medical):   Physical Activity:   . Days of Exercise per Week:   . Minutes of Exercise per Session:   Stress:   . Feeling of Stress :   Social Connections:   . Frequency of  Communication with Friends and Family:   . Frequency of Social Gatherings with Friends and Family:   . Attends Religious Services:   . Active Member of Clubs or Organizations:   . Attends Archivist Meetings:   Marland Kitchen Marital Status:   Intimate Partner Violence:   . Fear of Current or Ex-Partner:   . Emotionally Abused:   Marland Kitchen Physically Abused:   . Sexually Abused:     No Known Allergies  Current Outpatient Medications  Medication Sig Dispense Refill  . acetaminophen (TYLENOL) 500 MG tablet Take 500 mg by mouth every 6 (six) hours as needed.    Marland Kitchen alendronate (FOSAMAX) 70 MG tablet TAKE 1 TABLET BY MOUTH EVERY SEVEN DAYS 4 tablet 3  .  apixaban (ELIQUIS) 5 MG TABS tablet Take 1 tablet (5 mg total) by mouth 2 (two) times daily. 180 tablet 3  . cholecalciferol (VITAMIN D) 1000 units tablet Take 2,000 Units by mouth daily.     Marland Kitchen diltiazem (CARDIZEM CD) 180 MG 24 hr capsule Take 1 capsule (180 mg total) by mouth daily. 90 capsule 3  . esomeprazole (NEXIUM) 20 MG capsule Take 20 mg by mouth daily at 12 noon.    . Vitamin D, Ergocalciferol, (DRISDOL) 1.25 MG (50000 UNIT) CAPS capsule Take 1 capsule (50,000 Units total) by mouth every 7 (seven) days. 15 capsule 1   No current facility-administered medications for this visit.    REVIEW OF SYSTEMS:  [X]  denotes positive finding, [ ]  denotes negative finding Cardiac  Comments:  Chest pain or chest pressure:    Shortness of breath upon exertion:    Short of breath when lying flat:    Irregular heart rhythm:        Vascular    Pain in calf, thigh, or hip brought on by ambulation:    Pain in feet at night that wakes you up from your sleep:     Blood clot in your veins:    Leg swelling:         Pulmonary    Oxygen at home:    Productive cough:     Wheezing:         Neurologic    Sudden weakness in arms or legs:     Sudden numbness in arms or legs:     Sudden onset of difficulty speaking or slurred speech:    Temporary loss of vision in one eye:     Problems with dizziness:         Gastrointestinal    Blood in stool:     Vomited blood:         Genitourinary    Burning when urinating:     Blood in urine:        Psychiatric    Major depression:         Hematologic    Bleeding problems:    Problems with blood clotting too easily:        Skin    Rashes or ulcers:        Constitutional    Fever or chills:     PHYSICAL EXAM:   Vitals:   01/23/20 1345  BP: 129/79  Pulse: 68  Resp: 16  Temp: (!) 97.2 F (36.2 C)  TempSrc: Temporal  SpO2: 100%  Weight: 190 lb (86.2 kg)  Height: 5' 7.5" (1.715 m)    GENERAL: The patient is a well-nourished female, in  no acute distress. The vital signs are documented above. CARDIAC: There  is a regular rate and rhythm.  VASCULAR: I do not detect carotid bruits. She has palpable pedal pulses. She has telangiectasias and spider veins bilaterally.  She has no significant lower extremity swelling or hyperpigmentation.      PULMONARY: There is good air exchange bilaterally without wheezing or rales. ABDOMEN: Soft and non-tender with normal pitched bowel sounds.  MUSCULOSKELETAL: There are no major deformities or cyanosis. NEUROLOGIC: No focal weakness or paresthesias are detected. SKIN: There are no ulcers or rashes noted. PSYCHIATRIC: The patient has a normal affect.  DATA:    VENOUS DUPLEX: I have independently interpreted her venous duplex scan today.  On the right side there is no evidence of DVT.  There is deep venous reflux involving the common femoral vein and femoral vein.  She is previously had a right great saphenous vein and small saphenous vein ablated.  On the left side there is no evidence of DVT.  There is deep venous reflux involving the common femoral vein, femoral vein, and popliteal vein.  There is superficial venous reflux in the great saphenous vein in the calf although the vein is not dilated here.  There is reflux in anterior accessory saphenous vein which measures 0.56 cm in maximum diameter.

## 2020-04-03 ENCOUNTER — Other Ambulatory Visit: Payer: Self-pay

## 2020-04-03 ENCOUNTER — Ambulatory Visit (INDEPENDENT_AMBULATORY_CARE_PROVIDER_SITE_OTHER): Payer: PPO | Admitting: Family Medicine

## 2020-04-03 VITALS — BP 140/70 | HR 74 | Temp 97.3°F | Ht 67.0 in | Wt 194.0 lb

## 2020-04-03 DIAGNOSIS — I482 Chronic atrial fibrillation, unspecified: Secondary | ICD-10-CM | POA: Diagnosis not present

## 2020-04-03 DIAGNOSIS — I480 Paroxysmal atrial fibrillation: Secondary | ICD-10-CM | POA: Diagnosis not present

## 2020-04-03 DIAGNOSIS — E559 Vitamin D deficiency, unspecified: Secondary | ICD-10-CM

## 2020-04-03 NOTE — Progress Notes (Signed)
Subjective:    Patient ID: Tara Brennan, female    DOB: Dec 16, 1950, 69 y.o.   MRN: [id number]  HPI  10/04/19 Patient is a very pleasant 69 year old Caucasian female who presents today for a checkup.  Last November she had an episode of atrial fibrillation.  She is uncertain as to what caused it.  She went into rapid ventricular response and went to the emergency room.  After receiving IV Cardizem, she spontaneously converted back to normal sinus rhythm and she remains in normal sinus rhythm today.  She continues to take Cardizem.  She denies any chest pain shortness of breath or dyspnea on exertion.  She denies any syncope or near syncope.  She remains on Eliquis as an anticoagulant.  We discussed the rationale behind remaining on Eliquis for primary prevention of stroke.  The patient denies any bleeding or bruising.  She has a documented history of osteopenia however I reviewed her bone density test from last year.  Patient has severe osteoporosis.  In 2008 she had a T score of -4.4 in her spine.  This is improved on Fosamax to -3.6.  The patient has been taking Fosamax for more than 10 years.  She has never had a therapeutic break.  However her bone density has worsened over the last 4 years in her left hip.  She went from osteopenia with a T score of -2.3.osteoporosis with a T score of -2.6 in her left hip.  Therefore after long discussion we have elected for her to remain on Fosamax.  She is taking vitamin D but she is not taking any calcium supplements.  Regarding her preventative care, her colonoscopy was performed last year and is up-to-date.  She is due for Prevnar 13.  At that time, my plan was: Patient is currently in normal sinus rhythm.  Her heart rate is well controlled on Cardizem.  She denies any hypotension or bradycardia or lightheadedness or syncope.  I will make no changes in her Cardizem or her Eliquis.  I will check a CBC to monitor for any anemia or evidence of blood loss.  I will  also monitor her renal function with a CMP as well as checking a fasting lipid panel to screen for hyperlipidemia.  The patient has osteoporosis which is actually worsened in her femur over the last 4 years.  Therefore I will continue the patient on Fosamax at the present time.  I will check a vitamin D level.  I also encouraged the patient to take 1200 mg a day of calcium.  The patient received Prevnar 13 today.  She should get Pneumovax 23 next year.  She declined HIV and hepatitis C screening.  04/03/20 Patient is here today for a follow-up.  She is still in normal sinus rhythm.  She denies any chest pain, shortness of breath, dyspnea on exertion.  She denies any palpitations.  She denies any tachycardia.  She is still taking her Eliquis.  She denies any bleeding or bruising.  Blood pressure today is well controlled.  She is due for Covid vaccination.  She is hesitant to receive this vaccine.  She also has a history of vitamin D deficiency.  She is taking 50,000 units for 6 months and is due to recheck a level  Past Medical History:  Diagnosis Date  . Anxiety   . Atrial fibrillation ([place])    a. diagnosed in 06/2019 with sponatenous conversion back to NSR  . GERD (gastroesophageal reflux disease)   .  Osteopenia   . Ovarian cyst      Past Surgical History:  Procedure Laterality Date  . ABDOMINAL HYSTERECTOMY  1984  . ESOPHAGOGASTRODUODENOSCOPY  06/28/2012   Procedure: ESOPHAGOGASTRODUODENOSCOPY (EGD);  Surgeon: Jeryl Columbia, MD;  Location: [facility] ENDOSCOPY;  Service: Endoscopy;  Laterality: N/A;  . OVARIAN CYST SURGERY    . PARTIAL HYSTERECTOMY     Current Outpatient Medications on File Prior to Visit  Medication Sig Dispense Refill  . acetaminophen (TYLENOL) 500 MG tablet Take 500 mg by mouth every 6 (six) hours as needed.    Marland Kitchen alendronate (FOSAMAX) 70 MG tablet TAKE 1 TABLET BY MOUTH EVERY SEVEN DAYS 4 tablet 3  . apixaban (ELIQUIS) 5 MG TABS tablet Take 1 tablet (5 mg total) by mouth 2  (two) times daily. 180 tablet 3  . cholecalciferol (VITAMIN D) 1000 units tablet Take 2,000 Units by mouth daily.     Marland Kitchen diltiazem (CARDIZEM CD) 180 MG 24 hr capsule Take 1 capsule (180 mg total) by mouth daily. 90 capsule 3  . esomeprazole (NEXIUM) 20 MG capsule Take 20 mg by mouth daily at 12 noon.    . Vitamin D, Ergocalciferol, (DRISDOL) 1.25 MG (50000 UNIT) CAPS capsule Take 1 capsule (50,000 Units total) by mouth every 7 (seven) days. 15 capsule 1   No current facility-administered medications on file prior to visit.   No Known Allergies Social History   Socioeconomic History  . Marital status: Married    Spouse name: Not on file  . Number of children: Not on file  . Years of education: Not on file  . Highest education level: Not on file  Occupational History  . Not on file  Tobacco Use  . Smoking status: Never Smoker  . Smokeless tobacco: Never Used  Vaping Use  . Vaping Use: Never used  Substance and Sexual Activity  . Alcohol use: No  . Drug use: No  . Sexual activity: Yes    Birth control/protection: Surgical  Other Topics Concern  . Not on file  Social History Narrative  . Not on file   Social Determinants of Health   Financial Resource Strain:   . Difficulty of Paying Living Expenses: Not on file  Food Insecurity:   . Worried About Charity fundraiser in the Last Year: Not on file  . Ran Out of Food in the Last Year: Not on file  Transportation Needs:   . Lack of Transportation (Medical): Not on file  . Lack of Transportation (Non-Medical): Not on file  Physical Activity:   . Days of Exercise per Week: Not on file  . Minutes of Exercise per Session: Not on file  Stress:   . Feeling of Stress : Not on file  Social Connections:   . Frequency of Communication with Friends and Family: Not on file  . Frequency of Social Gatherings with Friends and Family: Not on file  . Attends Religious Services: Not on file  . Active Member of Clubs or Organizations: Not  on file  . Attends Archivist Meetings: Not on file  . Marital Status: Not on file  Intimate Partner Violence:   . Fear of Current or Ex-Partner: Not on file  . Emotionally Abused: Not on file  . Physically Abused: Not on file  . Sexually Abused: Not on file    Review of Systems     Objective:   Physical Exam Vitals reviewed.  Constitutional:      Appearance: She is  normal weight.  Cardiovascular:     Rate and Rhythm: Normal rate and regular rhythm.     Pulses: Normal pulses.     Heart sounds: Normal heart sounds. No murmur heard.  No friction rub. No gallop.   Pulmonary:     Effort: Pulmonary effort is normal. No respiratory distress.     Breath sounds: Normal breath sounds. No wheezing, rhonchi or rales.  Abdominal:     General: Abdomen is flat. Bowel sounds are normal. There is no distension.     Palpations: Abdomen is soft. There is no mass.     Tenderness: There is no abdominal tenderness. There is no guarding.     Hernia: No hernia is present.  Musculoskeletal:     Right lower leg: No edema.     Left lower leg: No edema.  Neurological:     Mental Status: She is alert.           Assessment & Plan:  Vitamin D deficiency - Plan: VITAMIN D 25 Hydroxy (Vit-D Deficiency, Fractures)  Paroxysmal atrial fibrillation (HCC) - Plan: CBC with Differential/Platelet, COMPLETE METABOLIC PANEL WITH GFR, Lipid panel  Check CBC, CMP, fasting lipid panel.  Goal LDL cholesterol is less than 100.  Monitor for any anemia on her anticoagulant.  Monitor renal function.  Patient has a history of fatty liver disease.  Check liver function test.  Encourage diet exercise and weight loss.  Recheck a vitamin D level to monitor her vitamin D deficiency.  Strongly recommended the Covid vaccination.

## 2020-04-04 LAB — LIPID PANEL
Cholesterol: 232 mg/dL — ABNORMAL HIGH (ref <200)
HDL: 59 mg/dL (ref >=50)
LDL Cholesterol (Calc): 146 mg/dL (calc) — ABNORMAL HIGH
Non-HDL Cholesterol (Calc): 173 mg/dL (calc) — ABNORMAL HIGH (ref <130)
Total CHOL/HDL Ratio: 3.9 (calc) (ref <5.0)
Triglycerides: 138 mg/dL (ref <150)

## 2020-04-04 LAB — CBC WITH DIFFERENTIAL/PLATELET
Absolute Monocytes: 423 cells/uL (ref 200–950)
Basophils Absolute: 39 cells/uL (ref 0–200)
Basophils Relative: 0.6 %
Eosinophils Absolute: 150 cells/uL (ref 15–500)
Eosinophils Relative: 2.3 %
HCT: 42.4 % (ref 35.0–45.0)
Hemoglobin: 13.6 g/dL (ref 11.7–15.5)
Lymphs Abs: 1495 cells/uL (ref 850–3900)
MCH: 27.9 pg (ref 27.0–33.0)
MCHC: 32.1 g/dL (ref 32.0–36.0)
MCV: 86.9 fL (ref 80.0–100.0)
MPV: 11.8 fL (ref 7.5–12.5)
Monocytes Relative: 6.5 %
Neutro Abs: 4394 cells/uL (ref 1500–7800)
Neutrophils Relative %: 67.6 %
Platelets: 309 10*3/uL (ref 140–400)
RBC: 4.88 10*6/uL (ref 3.80–5.10)
RDW: 13.1 % (ref 11.0–15.0)
Total Lymphocyte: 23 %
WBC: 6.5 10*3/uL (ref 3.8–10.8)

## 2020-04-04 LAB — COMPLETE METABOLIC PANEL WITH GFR
AG Ratio: 2.1 (calc) (ref 1.0–2.5)
ALT: 29 U/L (ref 6–29)
AST: 25 U/L (ref 10–35)
Albumin: 4.7 g/dL (ref 3.6–5.1)
Alkaline phosphatase (APISO): 78 U/L (ref 37–153)
BUN: 23 mg/dL (ref 7–25)
CO2: 31 mmol/L (ref 20–32)
Calcium: 10.3 mg/dL (ref 8.6–10.4)
Chloride: 104 mmol/L (ref 98–110)
Creat: 0.83 mg/dL (ref 0.50–0.99)
GFR, Est African American: 84 mL/min/{1.73_m2} (ref >=60)
GFR, Est Non African American: 72 mL/min/{1.73_m2} (ref >=60)
Globulin: 2.2 g/dL (calc) (ref 1.9–3.7)
Glucose, Bld: 100 mg/dL — ABNORMAL HIGH (ref 65–99)
Potassium: 4.5 mmol/L (ref 3.5–5.3)
Sodium: 141 mmol/L (ref 135–146)
Total Bilirubin: 0.6 mg/dL (ref 0.2–1.2)
Total Protein: 6.9 g/dL (ref 6.1–8.1)

## 2020-04-04 LAB — VITAMIN D 25 HYDROXY (VIT D DEFICIENCY, FRACTURES): Vit D, 25-Hydroxy: 57 ng/mL (ref 30–100)

## 2020-04-16 ENCOUNTER — Encounter: Payer: Self-pay | Admitting: Family Medicine

## 2020-04-16 ENCOUNTER — Ambulatory Visit: Payer: Self-pay | Attending: Internal Medicine

## 2020-04-16 ENCOUNTER — Ambulatory Visit: Payer: Self-pay

## 2020-04-16 DIAGNOSIS — Z23 Encounter for immunization: Secondary | ICD-10-CM

## 2020-04-16 NOTE — Progress Notes (Signed)
   Covid-19 Vaccination Clinic  Name:  Tara Brennan    MRN: [id number] DOB: Aug 04, 1951  04/16/2020  [name] was observed post Covid-19 immunization for 15 minutes without incident. She was provided with Vaccine Information Sheet and instruction to access the V-Safe system.   [name] was instructed to call 911 with any severe reactions post vaccine: Marland Kitchen Difficulty breathing  . Swelling of face and throat  . A fast heartbeat  . A bad rash all over body  . Dizziness and weakness   Immunizations Administered    Name Date Dose VIS Date Route   Pfizer COVID-19 Vaccine 04/16/2020 11:49 AM 0.3 mL 10/02/2018 Intramuscular   Manufacturer: [employer]   Lot: [id number]   [place]: [address]-9

## 2020-04-24 ENCOUNTER — Other Ambulatory Visit: Payer: Self-pay

## 2020-04-24 MED ORDER — ALENDRONATE SODIUM 70 MG PO TABS: ORAL_TABLET | ORAL | 3 refills | Status: DC

## 2020-05-07 ENCOUNTER — Ambulatory Visit: Payer: PPO | Attending: Internal Medicine

## 2020-05-07 DIAGNOSIS — Z23 Encounter for immunization: Secondary | ICD-10-CM

## 2020-05-07 NOTE — Progress Notes (Signed)
   Covid-19 Vaccination Clinic  Name:  KRITHI BRAY    MRN: [id number] DOB: [birth year]  05/07/2020  Ms. Gassmann was observed post Covid-19 immunization for 15 minutes without incident. She was provided with Vaccine Information Sheet and instruction to access the V-Safe system.   Ms. Chestnutt was instructed to call 911 with any severe reactions post vaccine: Marland Kitchen Difficulty breathing  . Swelling of face and throat  . A fast heartbeat  . A bad rash all over body  . Dizziness and weakness   Immunizations Administered    Name Date Dose VIS Date Route   Pfizer COVID-19 Vaccine 05/07/2020  9:49 AM 0.3 mL 10/02/2018 Intramuscular   Manufacturer: [place]   Lot: [id number]   [place]: [id number]

## 2020-05-14 DIAGNOSIS — K219 Gastro-esophageal reflux disease without esophagitis: Secondary | ICD-10-CM | POA: Diagnosis not present

## 2020-05-14 DIAGNOSIS — I259 Chronic ischemic heart disease, unspecified: Secondary | ICD-10-CM | POA: Diagnosis not present

## 2020-05-26 DIAGNOSIS — H00024 Hordeolum internum left upper eyelid: Secondary | ICD-10-CM | POA: Diagnosis not present

## 2020-06-01 ENCOUNTER — Ambulatory Visit: Payer: PPO | Admitting: Physician Assistant

## 2020-06-04 ENCOUNTER — Ambulatory Visit: Payer: PPO | Admitting: Student

## 2020-06-05 ENCOUNTER — Ambulatory Visit: Payer: PPO | Admitting: Cardiovascular Disease

## 2020-06-09 ENCOUNTER — Ambulatory Visit (INDEPENDENT_AMBULATORY_CARE_PROVIDER_SITE_OTHER): Payer: PPO | Admitting: Family Medicine

## 2020-06-09 ENCOUNTER — Other Ambulatory Visit: Payer: Self-pay

## 2020-06-09 DIAGNOSIS — Z23 Encounter for immunization: Secondary | ICD-10-CM

## 2020-06-18 ENCOUNTER — Ambulatory Visit: Payer: PPO | Admitting: Cardiology

## 2020-07-09 ENCOUNTER — Ambulatory Visit (INDEPENDENT_AMBULATORY_CARE_PROVIDER_SITE_OTHER): Payer: PPO | Admitting: Student

## 2020-07-09 ENCOUNTER — Encounter: Payer: Self-pay | Admitting: Student

## 2020-07-09 ENCOUNTER — Other Ambulatory Visit: Payer: Self-pay

## 2020-07-09 VITALS — BP 136/62 | HR 86 | Ht 67.0 in | Wt 200.8 lb

## 2020-07-09 DIAGNOSIS — K219 Gastro-esophageal reflux disease without esophagitis: Secondary | ICD-10-CM | POA: Diagnosis not present

## 2020-07-09 DIAGNOSIS — I10 Essential (primary) hypertension: Secondary | ICD-10-CM

## 2020-07-09 DIAGNOSIS — I480 Paroxysmal atrial fibrillation: Secondary | ICD-10-CM | POA: Diagnosis not present

## 2020-07-09 MED ORDER — APIXABAN 5 MG PO TABS: 5.0000 mg | ORAL_TABLET | Freq: Two times a day (BID) | ORAL | 3 refills | Status: DC

## 2020-07-09 MED ORDER — DILTIAZEM HCL ER COATED BEADS 180 MG PO CP24: 180.0000 mg | ORAL_CAPSULE | Freq: Every day | ORAL | 3 refills | Status: DC

## 2020-07-09 NOTE — Progress Notes (Signed)
Cardiology Office Note    Date:  07/09/2020   ID:  Tara, Brennan 1951/06/15, MRN [id number]  PCP:  Tara Frizzle, MD  Cardiologist: Previously Dr. Bronson Brennan --> Patient requests to switch to Dr. Domenic Brennan as her sister is a patient of his.  Chief Complaint  Patient presents with  . Follow-up    6 month visit    History of Present Illness:    Tara Brennan is a 69 y.o. female with past medical history of paroxysmal atrial fibrillation, HTN and GERD who presents to the office today for 77-month follow-up.   She was last examined by Dr. Bronson Brennan in 11/2019 and denied any recent chest pain or palpitations at that time. She was staying very active around the house and denied any concerning symptoms. She was continued on her current medication regimen including Eliquis 5 mg twice daily and Cardizem CD 180 mg daily  In talking with the patient today, she reports overall doing well since her last office visit. She denies any recent exertional chest pain or palpitations. She does report occasional dyspnea when walking up inclines but says this has overall been stable. No recent orthopnea, PND or lower extremity edema. She does have known acid reflux and a hiatal hernia which causes some sternal discomfort but this typically occurs after food consumption and is not associated with activity.  She has gained weight since the pandemic and reports she is not walking as frequently as she was prior to this. She plans to increase her activity level and make dietary changes. Previously had success with Nutrisystem.   Past Medical History:  Diagnosis Date  . Anxiety   . Atrial fibrillation ([place])    a. diagnosed in 06/2019 with sponatenous conversion back to NSR  . GERD (gastroesophageal reflux disease)   . Osteopenia   . Ovarian cyst     Past Surgical History:  Procedure Laterality Date  . ABDOMINAL HYSTERECTOMY  1984  . ESOPHAGOGASTRODUODENOSCOPY  06/28/2012   Procedure:  ESOPHAGOGASTRODUODENOSCOPY (EGD);  Surgeon: Jeryl Columbia, MD;  Location[facility] END[facility]COPY;  Service: Endoscopy;  Laterality: N/A;  . OVARIAN CYST SURGERY    . PARTIAL HYSTERECTOMY      Current Medications: Outpatient Medications Prior to Visit  Medication Sig Dispense Refill  . acetaminophen (TYLENOL) 500 MG tablet Take 500 mg by mouth every 6 (six) hours as needed.    Marland Kitchen alendronate (FOSAMAX) 70 MG tablet TAKE 1 TABLET BY MOUTH EVERY SEVEN DAYS 4 tablet 3  . cholecalciferol (VITAMIN D) 1000 units tablet Take 2,000 Units by mouth daily.     . Omega-3 Fatty Acids (FISH OIL) 1000 MG CAPS Take by mouth.    . pantoprazole (PROTONIX) 40 MG tablet Take 40 mg by mouth daily.    . Vitamin D, Ergocalciferol, (DRISDOL) 1.25 MG (50000 UNIT) CAPS capsule Take 1 capsule (50,000 Units total) by mouth every 7 (seven) days. 15 capsule 1  . apixaban (ELIQUIS) 5 MG TABS tablet Take 1 tablet (5 mg total) by mouth 2 (two) times daily. 180 tablet 3  . diltiazem (CARDIZEM CD) 180 MG 24 hr capsule Take 1 capsule (180 mg total) by mouth daily. 90 capsule 3  . esomeprazole (NEXIUM) 20 MG capsule Take 20 mg by mouth daily at 12 noon.     No facility-administered medications prior to visit.     Allergies:   Patient has no known allergies.   Social History   Socioeconomic History  . Marital status: Married  Spouse name: Not on file  . Number of children: Not on file  . Years of education: Not on file  . Highest education level: Not on file  Occupational History  . Not on file  Tobacco Use  . Smoking status: Never Smoker  . Smokeless tobacco: Never Used  Vaping Use  . Vaping Use: Never used  Substance and Sexual Activity  . Alcohol use: No  . Drug use: No  . Sexual activity: Yes    Birth control/protection: Surgical  Other Topics Concern  . Not on file  Social History Narrative  . Not on file   Social Determinants of Health   Financial Resource Strain:   . Difficulty of Paying Living Expenses: Not  on file  Food Insecurity:   . Worried About Charity fundraiser in the Last Year: Not on file  . Ran Out of Food in the Last Year: Not on file  Transportation Needs:   . Lack of Transportation (Medical): Not on file  . Lack of Transportation (Non-Medical): Not on file  Physical Activity:   . Days of Exercise per Week: Not on file  . Minutes of Exercise per Session: Not on file  Stress:   . Feeling of Stress : Not on file  Social Connections:   . Frequency of Communication with Friends and Family: Not on file  . Frequency of Social Gatherings with Friends and Family: Not on file  . Attends Religious Services: Not on file  . Active Member of Clubs or Organizations: Not on file  . Attends Archivist Meetings: Not on file  . Marital Status: Not on file     Family History:  The patient's family history includes Arthritis in an other family member; Cancer in her father; Diabetes in her sister; Hypertension in her mother.   Review of Systems:   Please see the history of present illness.     General:  No chills, fever, night sweats or weight changes.  Cardiovascular:  No chest pain, edema, orthopnea, palpitations, paroxysmal nocturnal dyspnea. Positive for dyspnea on exertion (at baseline).  Dermatological: No rash, lesions/masses Respiratory: No cough, dyspnea Urologic: No hematuria, dysuria Abdominal:   No nausea, vomiting, diarrhea, bright red blood per rectum, melena, or hematemesis Neurologic:  No visual changes, wkns, changes in mental status. All other systems reviewed and are otherwise negative except as noted above.   Physical Exam:    VS:  BP 136/62   Pulse 86   Ht 5\' 7"  (1.702 m)   Wt 200 lb 12.8 oz (91.1 kg)   SpO2 96%   BMI 31.45 kg/m    General: Well developed, well nourished,female appearing in no acute distress. Head: Normocephalic, atraumatic. Neck: No carotid bruits. JVD not elevated.  Lungs: Respirations regular and unlabored, without wheezes or  rales.  Heart: Regular rate and rhythm. No S3 or S4.  No murmur, no rubs, or gallops appreciated. Abdomen: Appears non-distended. No obvious abdominal masses. Msk:  Strength and tone appear normal for age. No obvious joint deformities or effusions. Extremities: No clubbing or cyanosis. Trace lower extremity edema bilaterally.  Distal pedal pulses are 2+ bilaterally. Varicose veins noted. Neuro: Alert and oriented X 3. Moves all extremities spontaneously. No focal deficits noted. Psych:  Responds to questions appropriately with a normal affect. Skin: No rashes or lesions noted  Wt Readings from Last 3 Encounters:  07/09/20 200 lb 12.8 oz (91.1 kg)  04/03/20 194 lb (88 kg)  01/23/20 190 lb (86.2  kg)     Studies/Labs Reviewed:   EKG:  EKG is ordered today.  The ekg ordered today demonstrates NSR, HR 80 with no acute ST changes when compared to prior tracings.   Recent Labs: 04/03/2020: ALT 29; BUN 23; Creat 0.83; Hemoglobin 13.6; Platelets 309; Potassium 4.5; Sodium 141   Lipid Panel    Component Value Date/Time   CHOL 232 (H) 04/03/2020 0823   TRIG 138 04/03/2020 0823   HDL 59 04/03/2020 0823   CHOLHDL 3.9 04/03/2020 0823   LDLCALC 146 (H) 04/03/2020 0823    Additional studies/ records that were reviewed today include:   Echocardiogram: 06/2019 IMPRESSIONS    1. Left ventricular ejection fraction, by visual estimation, is 60 to  65%. The left ventricle has normal function. There is mildly increased  left ventricular hypertrophy.  2. Left ventricular diastolic function could not be evaluated.  3. The left ventricle has no regional wall motion abnormalities.  4. Global right ventricle has normal systolic function.The right  ventricular size is normal. No increase in right ventricular wall  thickness.  5. Left atrial size was normal.  6. Right atrial size was normal.  7. The mitral valve is grossly normal. Mild mitral valve regurgitation.  8. The tricuspid valve is  grossly normal. Tricuspid valve regurgitation  is trivial.  9. The aortic valve is tricuspid. Aortic valve regurgitation is not  visualized. No evidence of aortic valve sclerosis or stenosis.  10. The pulmonic valve was grossly normal. Pulmonic valve regurgitation is  not visualized.  11. The inferior vena cava is normal in size with greater than 50%  respiratory variability, suggesting right atrial pressure of 3 mmHg.   Assessment:    1. PAF (paroxysmal atrial fibrillation) ([place])   2. Essential hypertension   3. Gastroesophageal reflux disease, unspecified whether esophagitis present      Plan:   In order of problems listed above:  1. Paroxysmal Atrial Fibrillation - She denies any recent palpitations and is in normal sinus rhythm by examination and EKG today. Continue Cardizem CD 180 mg daily for rate control. - She denies any evidence of active bleeding. Remains on Eliquis 5 mg twice daily for anticoagulation. Recent CBC in 03/2020 showed hemoglobin was stable at 13.6 and platelets at 309K.  2. HTN - BP is at 136/62 during today's visit. Continue current medication regimen with Cardizem CD 180mg  daily. She is also planning to restart her walking program and make dietary changes which should help with BP control as well.   3. GERD - She remains on Protonix 40mg  daily. We reviewed warning signs to watch for in an effort to help differentiate her acid reflux from potential cardiac symptoms.    Medication Adjustments/Labs and Tests Ordered: Current medicines are reviewed at length with the patient today.  Concerns regarding medicines are outlined above.  Medication changes, Labs and Tests ordered today are listed in the Patient Instructions below. Patient Instructions  Medication Instructions:  Your physician recommends that you continue on your current medications as directed. Please refer to the Current Medication list given to you today.  *If you need a refill on your cardiac  medications before your next appointment, please call your pharmacy*   Lab Work: NONE   If you have labs (blood work) drawn today and your tests are completely normal, you will receive your results only by: Marland Kitchen MyChart Message (if you have MyChart) OR . A paper copy in the mail If you have any lab test that  is abnormal or we need to change your treatment, we will call you to review the results.   Testing/Procedures: NONE    Follow-Up: At [facility], you and your health needs are our priority.  As part of our continuing mission to provide you with exceptional heart care, we have created designated Provider Care Teams.  These Care Teams include your primary Cardiologist (physician) and Advanced Practice Providers (APPs -  Physician Assistants and Nurse Practitioners) who all work together to provide you with the care you need, when you need it.  We recommend signing up for the patient portal called "MyChart".  Sign up information is provided on this After Visit Summary.  MyChart is used to connect with patients for Virtual Visits (Telemedicine).  Patients are able to view lab/test results, encounter notes, upcoming appointments, etc.  Non-urgent messages can be sent to your provider as well.   To learn more about what you can do with MyChart, go to [email or web address].    Your next appointment:   6 month(s)  The format for your next appointment:   In Person  Provider:   You may see Rozann Lesches, MD or one of the following Advanced Practice Providers on your designated Care Team:    Bernerd Pho, PA-C   Ermalinda Barrios, PA-C     Other Instructions Thank you for choosing [facility]!       Signed, Erma Heritage, PA-C  07/09/2020 5:03 PM    [address] [address] [place], [place] [address] Phone: [phone] Fax: [phone]

## 2020-07-09 NOTE — Patient Instructions (Signed)
Medication Instructions:  Your physician recommends that you continue on your current medications as directed. Please refer to the Current Medication list given to you today.  *If you need a refill on your cardiac medications before your next appointment, please call your pharmacy*   Lab Work: NONE   If you have labs (blood work) drawn today and your tests are completely normal, you will receive your results only by: . MyChart Message (if you have MyChart) OR . A paper copy in the mail If you have any lab test that is abnormal or we need to change your treatment, we will call you to review the results.   Testing/Procedures: NONE   Follow-Up: At [facility], you and your health needs are our priority.  As part of our continuing mission to provide you with exceptional heart care, we have created designated Provider Care Teams.  These Care Teams include your primary Cardiologist (physician) and Advanced Practice Providers (APPs -  Physician Assistants and Nurse Practitioners) who all work together to provide you with the care you need, when you need it.  We recommend signing up for the patient portal called "MyChart".  Sign up information is provided on this After Visit Summary.  MyChart is used to connect with patients for Virtual Visits (Telemedicine).  Patients are able to view lab/test results, encounter notes, upcoming appointments, etc.  Non-urgent messages can be sent to your provider as well.   To learn more about what you can do with MyChart, go to [email or web address].    Your next appointment:   6 month(s)  The format for your next appointment:   In Person  Provider:   You may see Samuel McDowell, MD or one of the following Advanced Practice Providers on your designated Care Team:    Brittany Strader, PA-C   Michele Lenze, PA-C     Other Instructions Thank you for choosing [facility]!    

## 2020-07-13 ENCOUNTER — Other Ambulatory Visit: Payer: Self-pay | Admitting: Student

## 2020-07-13 NOTE — Telephone Encounter (Signed)
This is a [facility] pt.  °

## 2020-07-17 ENCOUNTER — Other Ambulatory Visit: Payer: Self-pay | Admitting: Student

## 2020-07-17 NOTE — Telephone Encounter (Signed)
This is a [facility] pt.  °

## 2020-07-30 ENCOUNTER — Telehealth: Payer: Self-pay | Admitting: Student

## 2020-07-30 NOTE — Telephone Encounter (Signed)
Patient calling the office for samples of medication:   1.  What medication and dosage are you requesting samples for? eliquis  2.  Are you currently out of this medication? Yes needs only 10 pills

## 2020-07-30 NOTE — Telephone Encounter (Signed)
2 Sample boxes of Eliquis (Lot# [id number], EXP: Jul 2023). Called and notified pt.

## 2020-09-29 ENCOUNTER — Ambulatory Visit (INDEPENDENT_AMBULATORY_CARE_PROVIDER_SITE_OTHER): Payer: PPO | Admitting: Family Medicine

## 2020-09-29 ENCOUNTER — Encounter: Payer: Self-pay | Admitting: Family Medicine

## 2020-09-29 ENCOUNTER — Other Ambulatory Visit: Payer: Self-pay

## 2020-09-29 VITALS — BP 130/80 | HR 73 | Temp 97.7°F | Ht 67.0 in | Wt 196.0 lb

## 2020-09-29 DIAGNOSIS — I480 Paroxysmal atrial fibrillation: Secondary | ICD-10-CM | POA: Diagnosis not present

## 2020-09-29 DIAGNOSIS — M810 Age-related osteoporosis without current pathological fracture: Secondary | ICD-10-CM

## 2020-09-29 LAB — LIPID PANEL
Cholesterol: 200 mg/dL — ABNORMAL HIGH (ref <200)
HDL: 65 mg/dL (ref >=50)
LDL Cholesterol (Calc): 118 mg/dL (calc) — ABNORMAL HIGH
Non-HDL Cholesterol (Calc): 135 mg/dL (calc) — ABNORMAL HIGH (ref <130)
Total CHOL/HDL Ratio: 3.1 (calc) (ref <5.0)
Triglycerides: 75 mg/dL (ref <150)

## 2020-09-29 LAB — CBC WITH DIFFERENTIAL/PLATELET
Absolute Monocytes: 512 cells/uL (ref 200–950)
Basophils Absolute: 51 cells/uL (ref 0–200)
Basophils Relative: 0.8 %
Eosinophils Absolute: 192 cells/uL (ref 15–500)
Eosinophils Relative: 3 %
HCT: 39.3 % (ref 35.0–45.0)
Hemoglobin: 13 g/dL (ref 11.7–15.5)
Lymphs Abs: 1773 cells/uL (ref 850–3900)
MCH: 28.3 pg (ref 27.0–33.0)
MCHC: 33.1 g/dL (ref 32.0–36.0)
MCV: 85.4 fL (ref 80.0–100.0)
MPV: 12 fL (ref 7.5–12.5)
Monocytes Relative: 8 %
Neutro Abs: 3872 cells/uL (ref 1500–7800)
Neutrophils Relative %: 60.5 %
Platelets: 305 10*3/uL (ref 140–400)
RBC: 4.6 10*6/uL (ref 3.80–5.10)
RDW: 13 % (ref 11.0–15.0)
Total Lymphocyte: 27.7 %
WBC: 6.4 10*3/uL (ref 3.8–10.8)

## 2020-09-29 LAB — COMPLETE METABOLIC PANEL WITH GFR
AG Ratio: 1.7 (calc) (ref 1.0–2.5)
ALT: 53 U/L — ABNORMAL HIGH (ref 6–29)
AST: 37 U/L — ABNORMAL HIGH (ref 10–35)
Albumin: 4.4 g/dL (ref 3.6–5.1)
Alkaline phosphatase (APISO): 75 U/L (ref 37–153)
BUN: 16 mg/dL (ref 7–25)
CO2: 30 mmol/L (ref 20–32)
Calcium: 10.2 mg/dL (ref 8.6–10.4)
Chloride: 104 mmol/L (ref 98–110)
Creat: 0.79 mg/dL (ref 0.50–0.99)
GFR, Est African American: 89 mL/min/{1.73_m2} (ref >=60)
GFR, Est Non African American: 77 mL/min/{1.73_m2} (ref >=60)
Globulin: 2.6 g/dL (calc) (ref 1.9–3.7)
Glucose, Bld: 103 mg/dL — ABNORMAL HIGH (ref 65–99)
Potassium: 4.9 mmol/L (ref 3.5–5.3)
Sodium: 141 mmol/L (ref 135–146)
Total Bilirubin: 0.8 mg/dL (ref 0.2–1.2)
Total Protein: 7 g/dL (ref 6.1–8.1)

## 2020-09-29 MED ORDER — ALENDRONATE SODIUM 70 MG PO TABS
ORAL_TABLET | ORAL | 3 refills | Status: DC
Start: 2020-09-29 — End: 2021-08-27

## 2020-09-29 NOTE — Progress Notes (Signed)
Subjective:    Patient ID: Tara Brennan, female    DOB: 1950/12/19, 70 y.o.   MRN: [id number]  HPI  Patient is a very pleasant 70 year old Caucasian female who presents today for a checkup.  November 2020 she had an episode of atrial fibrillation.  She is uncertain as to what caused it.  She went into rapid ventricular response and went to the emergency room.  After receiving IV Cardizem, she spontaneously converted back to normal sinus rhythm.  She is here today for a checkup.  She states that she is recently been having some abdominal discomfort.  It is vague.  There is no exacerbating or alleviating factors.  Seems to come and go randomly.  It occurs once or twice a week.  Sometimes it can be on the right side.  Sometimes it can be on the left side.  She denies any melena or hematochezia.  She denies any nausea or vomiting.  She denies any weight loss.  The pain is very mild.  It does not seem to be associated with food.  Sometimes she thinks it could be associated with gas.  She is in normal sinus rhythm.  Heart rate is well controlled and she is appropriately anticoagulated with Eliquis.  She denies any bleeding or bruising.  She has not been taking her Fosamax as she ran out of the prescription however she has documented history of osteoporosis  Past Medical History:  Diagnosis Date  . Anxiety   . Atrial fibrillation ([place])    a. diagnosed in 06/2019 with sponatenous conversion back to NSR  . GERD (gastroesophageal reflux disease)   . Osteopenia   . Ovarian cyst      Past Surgical History:  Procedure Laterality Date  . ABDOMINAL HYSTERECTOMY  1984  . ESOPHAGOGASTRODUODENOSCOPY  06/28/2012   Procedure: ESOPHAGOGASTRODUODENOSCOPY (EGD);  Surgeon: Jeryl Columbia, MD;  Location: [facility] ENDOSCOPY;  Service: Endoscopy;  Laterality: N/A;  . OVARIAN CYST SURGERY    . PARTIAL HYSTERECTOMY     Current Outpatient Medications on File Prior to Visit  Medication Sig Dispense Refill  .  acetaminophen (TYLENOL) 500 MG tablet Take 500 mg by mouth every 6 (six) hours as needed.    Marland Kitchen alendronate (FOSAMAX) 70 MG tablet TAKE 1 TABLET BY MOUTH EVERY SEVEN DAYS 4 tablet 3  . cholecalciferol (VITAMIN D) 1000 units tablet Take 2,000 Units by mouth daily.     Marland Kitchen diltiazem (CARDIZEM CD) 180 MG 24 hr capsule TAKE 1 CAPSULE(180 MG) BY MOUTH DAILY 90 capsule 3  . ELIQUIS 5 MG TABS tablet TAKE 1 TABLET(5 MG) BY MOUTH TWICE DAILY 180 tablet 3  . Omega-3 Fatty Acids (FISH OIL) 1000 MG CAPS Take by mouth.    . pantoprazole (PROTONIX) 40 MG tablet Take 40 mg by mouth daily.    . Vitamin D, Ergocalciferol, (DRISDOL) 1.25 MG (50000 UNIT) CAPS capsule Take 1 capsule (50,000 Units total) by mouth every 7 (seven) days. 15 capsule 1   No current facility-administered medications on file prior to visit.   No Known Allergies Social History   Socioeconomic History  . Marital status: Married    Spouse name: Not on file  . Number of children: Not on file  . Years of education: Not on file  . Highest education level: Not on file  Occupational History  . Not on file  Tobacco Use  . Smoking status: Never Smoker  . Smokeless tobacco: Never Used  Vaping Use  . Vaping Use:  Never used  Substance and Sexual Activity  . Alcohol use: No  . Drug use: No  . Sexual activity: Yes    Birth control/protection: Surgical  Other Topics Concern  . Not on file  Social History Narrative  . Not on file   Social Determinants of Health   Financial Resource Strain: Not on file  Food Insecurity: Not on file  Transportation Needs: Not on file  Physical Activity: Not on file  Stress: Not on file  Social Connections: Not on file  Intimate Partner Violence: Not on file    Review of Systems     Objective:   Physical Exam Vitals reviewed.  Constitutional:      Appearance: She is normal weight.  Cardiovascular:     Rate and Rhythm: Normal rate and regular rhythm.     Pulses: Normal pulses.     Heart  sounds: Normal heart sounds. No murmur heard. No friction rub. No gallop.   Pulmonary:     Effort: Pulmonary effort is normal. No respiratory distress.     Breath sounds: Normal breath sounds. No wheezing, rhonchi or rales.  Abdominal:     General: Abdomen is flat. Bowel sounds are normal. There is no distension.     Palpations: Abdomen is soft. There is no mass.     Tenderness: There is no abdominal tenderness. There is no guarding.     Hernia: No hernia is present.  Musculoskeletal:     Right lower leg: No edema.     Left lower leg: No edema.  Neurological:     Mental Status: She is alert.           Assessment & Plan:  Paroxysmal atrial fibrillation ([place]) - Plan: CBC with Differential/Platelet, COMPLETE METABOLIC PANEL WITH GFR, Lipid panel  Osteoporosis of femur without pathological fracture  Patient is currently in normal sinus rhythm however she is appropriately rate controlled and anticoagulated with Eliquis.  Blood pressure today is outstanding at 130/80.  I will check fasting lab work including a CBC, CMP, fasting lipid panel.  Recommended the patient resume Fosamax and recheck bone density test in 2 years.

## 2020-10-06 ENCOUNTER — Encounter: Payer: Self-pay | Admitting: Family Medicine

## 2020-10-08 ENCOUNTER — Other Ambulatory Visit: Payer: Self-pay | Admitting: *Deleted

## 2020-10-08 DIAGNOSIS — R7989 Other specified abnormal findings of blood chemistry: Secondary | ICD-10-CM

## 2020-10-16 ENCOUNTER — Other Ambulatory Visit (HOSPITAL_COMMUNITY): Payer: Self-pay | Admitting: Obstetrics & Gynecology

## 2020-10-16 DIAGNOSIS — Z1231 Encounter for screening mammogram for malignant neoplasm of breast: Secondary | ICD-10-CM

## 2020-10-20 ENCOUNTER — Other Ambulatory Visit: Payer: PPO

## 2020-10-20 ENCOUNTER — Other Ambulatory Visit: Payer: Self-pay

## 2020-10-20 DIAGNOSIS — R7989 Other specified abnormal findings of blood chemistry: Secondary | ICD-10-CM | POA: Diagnosis not present

## 2020-10-20 LAB — COMPLETE METABOLIC PANEL WITH GFR
AG Ratio: 1.9 (calc) (ref 1.0–2.5)
ALT: 36 U/L — ABNORMAL HIGH (ref 6–29)
AST: 27 U/L (ref 10–35)
Albumin: 4.3 g/dL (ref 3.6–5.1)
Alkaline phosphatase (APISO): 77 U/L (ref 37–153)
BUN: 18 mg/dL (ref 7–25)
CO2: 30 mmol/L (ref 20–32)
Calcium: 10.1 mg/dL (ref 8.6–10.4)
Chloride: 101 mmol/L (ref 98–110)
Creat: 0.8 mg/dL (ref 0.50–0.99)
GFR, Est African American: 88 mL/min/{1.73_m2} (ref >=60)
GFR, Est Non African American: 76 mL/min/{1.73_m2} (ref >=60)
Globulin: 2.3 g/dL (calc) (ref 1.9–3.7)
Glucose, Bld: 89 mg/dL (ref 65–99)
Potassium: 4.9 mmol/L (ref 3.5–5.3)
Sodium: 139 mmol/L (ref 135–146)
Total Bilirubin: 0.6 mg/dL (ref 0.2–1.2)
Total Protein: 6.6 g/dL (ref 6.1–8.1)

## 2020-10-21 ENCOUNTER — Ambulatory Visit (HOSPITAL_COMMUNITY)
Admission: RE | Admit: 2020-10-21 | Discharge: 2020-10-21 | Disposition: A | Discharge status: Home / Self Care | Payer: PPO | Source: Ambulatory Visit | Attending: Obstetrics & Gynecology | Admitting: Obstetrics & Gynecology

## 2020-10-21 DIAGNOSIS — Z1231 Encounter for screening mammogram for malignant neoplasm of breast: Secondary | ICD-10-CM | POA: Diagnosis not present

## 2020-11-09 DIAGNOSIS — L821 Other seborrheic keratosis: Secondary | ICD-10-CM | POA: Diagnosis not present

## 2020-11-09 DIAGNOSIS — D485 Neoplasm of uncertain behavior of skin: Secondary | ICD-10-CM | POA: Diagnosis not present

## 2020-11-09 DIAGNOSIS — D2261 Melanocytic nevi of right upper limb, including shoulder: Secondary | ICD-10-CM | POA: Diagnosis not present

## 2020-11-09 DIAGNOSIS — D225 Melanocytic nevi of trunk: Secondary | ICD-10-CM | POA: Diagnosis not present

## 2020-11-09 DIAGNOSIS — L820 Inflamed seborrheic keratosis: Secondary | ICD-10-CM | POA: Diagnosis not present

## 2020-11-09 DIAGNOSIS — L570 Actinic keratosis: Secondary | ICD-10-CM | POA: Diagnosis not present

## 2020-11-09 DIAGNOSIS — X32XXXD Exposure to sunlight, subsequent encounter: Secondary | ICD-10-CM | POA: Diagnosis not present

## 2020-11-23 DIAGNOSIS — Z6830 Body mass index (BMI) 30.0-30.9, adult: Secondary | ICD-10-CM | POA: Diagnosis not present

## 2020-11-23 DIAGNOSIS — Z01419 Encounter for gynecological examination (general) (routine) without abnormal findings: Secondary | ICD-10-CM | POA: Diagnosis not present

## 2020-11-27 ENCOUNTER — Other Ambulatory Visit: Payer: Self-pay

## 2020-11-27 ENCOUNTER — Encounter: Payer: Self-pay | Admitting: Family Medicine

## 2020-11-27 ENCOUNTER — Ambulatory Visit (INDEPENDENT_AMBULATORY_CARE_PROVIDER_SITE_OTHER): Payer: PPO | Admitting: Family Medicine

## 2020-11-27 ENCOUNTER — Ambulatory Visit (HOSPITAL_COMMUNITY)
Admission: RE | Admit: 2020-11-27 | Discharge: 2020-11-27 | Disposition: A | Discharge status: Home / Self Care | Payer: PPO | Source: Ambulatory Visit | Attending: Family Medicine | Admitting: Family Medicine

## 2020-11-27 VITALS — BP 142/78 | HR 92 | Temp 98.2°F | Resp 16 | Ht 67.0 in | Wt 199.0 lb

## 2020-11-27 DIAGNOSIS — R0781 Pleurodynia: Secondary | ICD-10-CM

## 2020-11-27 DIAGNOSIS — M25511 Pain in right shoulder: Secondary | ICD-10-CM | POA: Diagnosis not present

## 2020-11-27 DIAGNOSIS — S2241XA Multiple fractures of ribs, right side, initial encounter for closed fracture: Secondary | ICD-10-CM | POA: Diagnosis not present

## 2020-11-27 MED ORDER — CYCLOBENZAPRINE HCL 10 MG PO TABS: 10.0000 mg | ORAL_TABLET | Freq: Three times a day (TID) | ORAL | 0 refills | Status: DC | PRN

## 2020-11-27 NOTE — Progress Notes (Signed)
Subjective:    Patient ID: Tara Brennan, female    DOB: 26-Jan-1951, 70 y.o.   MRN: [id number]  HPI  Yesterday, the patient developed sudden severe pain in her back just below her shoulder blade.  It radiated around underneath her breast.  It hurt to breathe then.  It hurts to cough.  It hurts to move.  It began after she reached awkwardly into a recycling bin and felt a popping sensation in her right mid axillary line.  The pain is better this morning.  She denies any cough or fever or pleurisy or hemoptysis.  She does have some mild tenderness to palpation along the ribs in the midaxillary line.  She denies any severe pain with inspiration.  She denies any shortness of breath or fever or chills.  Past Medical History:  Diagnosis Date  . Anxiety   . Atrial fibrillation ([place])    a. diagnosed in 06/2019 with sponatenous conversion back to NSR  . GERD (gastroesophageal reflux disease)   . Osteopenia   . Ovarian cyst      Past Surgical History:  Procedure Laterality Date  . ABDOMINAL HYSTERECTOMY  1984  . ESOPHAGOGASTRODUODENOSCOPY  06/28/2012   Procedure: ESOPHAGOGASTRODUODENOSCOPY (EGD);  Surgeon: Jeryl Columbia, MD;  Location: [facility] ENDOSCOPY;  Service: Endoscopy;  Laterality: N/A;  . OVARIAN CYST SURGERY    . PARTIAL HYSTERECTOMY     Current Outpatient Medications on File Prior to Visit  Medication Sig Dispense Refill  . acetaminophen (TYLENOL) 500 MG tablet Take 500 mg by mouth every 6 (six) hours as needed.    Marland Kitchen alendronate (FOSAMAX) 70 MG tablet TAKE 1 TABLET BY MOUTH EVERY SEVEN DAYS 12 tablet 3  . cholecalciferol (VITAMIN D) 1000 units tablet Take 2,000 Units by mouth daily.     Marland Kitchen diltiazem (CARDIZEM CD) 180 MG 24 hr capsule TAKE 1 CAPSULE(180 MG) BY MOUTH DAILY 90 capsule 3  . ELIQUIS 5 MG TABS tablet TAKE 1 TABLET(5 MG) BY MOUTH TWICE DAILY 180 tablet 3  . Omega-3 Fatty Acids (FISH OIL) 1000 MG CAPS Take by mouth.    . pantoprazole (PROTONIX) 40 MG tablet Take 40 mg by  mouth daily.     No current facility-administered medications on file prior to visit.   No Known Allergies Social History   Socioeconomic History  . Marital status: Married    Spouse name: Not on file  . Number of children: Not on file  . Years of education: Not on file  . Highest education level: Not on file  Occupational History  . Not on file  Tobacco Use  . Smoking status: Never Smoker  . Smokeless tobacco: Never Used  Vaping Use  . Vaping Use: Never used  Substance and Sexual Activity  . Alcohol use: No  . Drug use: No  . Sexual activity: Yes    Birth control/protection: Surgical  Other Topics Concern  . Not on file  Social History Narrative   ** Merged History Encounter **       Social Determinants of Health   Financial Resource Strain: Not on file  Food Insecurity: Not on file  Transportation Needs: Not on file  Physical Activity: Not on file  Stress: Not on file  Social Connections: Not on file  Intimate Partner Violence: Not on file    Review of Systems     Objective:   Physical Exam Vitals reviewed.  Constitutional:      Appearance: She is normal weight.  Cardiovascular:  Rate and Rhythm: Normal rate and regular rhythm.     Pulses: Normal pulses.     Heart sounds: Normal heart sounds. No murmur heard. No friction rub. No gallop.   Pulmonary:     Effort: Pulmonary effort is normal. No respiratory distress.     Breath sounds: Normal breath sounds. No wheezing, rhonchi or rales.  Chest:     Chest wall: Tenderness present. No mass, deformity, swelling, crepitus or edema. There is no dullness to percussion.    Abdominal:     General: Abdomen is flat. Bowel sounds are normal. There is no distension.     Palpations: Abdomen is soft. There is no mass.     Tenderness: There is no abdominal tenderness. There is no guarding.     Hernia: No hernia is present.  Musculoskeletal:       Back:     Right lower leg: No edema.     Left lower leg: No  edema.  Neurological:     Mental Status: She is alert.           Assessment & Plan:  Rib pain - Plan: DG Ribs Unilateral Right  Patient is having chest wall pain.  I believe she may have bruised or even cracked a rib or possibly strained an intercostal muscle.  Proceed with x-rays of the rib.  Use Flexeril 10 mg every 8 hours as needed for pain along with Tylenol.  I do not believe that a blood clot is likely given the fact she is on Eliquis and her breath sounds are equal and symmetric so I do not feel that she has a pneumothorax.

## 2020-11-30 ENCOUNTER — Encounter: Payer: Self-pay | Admitting: Nurse Practitioner

## 2020-11-30 ENCOUNTER — Telehealth: Payer: Self-pay | Admitting: Family Medicine

## 2020-11-30 ENCOUNTER — Ambulatory Visit (INDEPENDENT_AMBULATORY_CARE_PROVIDER_SITE_OTHER): Payer: PPO | Admitting: Nurse Practitioner

## 2020-11-30 ENCOUNTER — Other Ambulatory Visit: Payer: Self-pay

## 2020-11-30 ENCOUNTER — Other Ambulatory Visit: Payer: Self-pay | Admitting: Nurse Practitioner

## 2020-11-30 VITALS — BP 180/110 | HR 85 | Temp 97.5°F | Ht 67.0 in | Wt 196.6 lb

## 2020-11-30 DIAGNOSIS — K5792 Diverticulitis of intestine, part unspecified, without perforation or abscess without bleeding: Secondary | ICD-10-CM | POA: Insufficient documentation

## 2020-11-30 DIAGNOSIS — R933 Abnormal findings on diagnostic imaging of other parts of digestive tract: Secondary | ICD-10-CM | POA: Insufficient documentation

## 2020-11-30 DIAGNOSIS — K219 Gastro-esophageal reflux disease without esophagitis: Secondary | ICD-10-CM | POA: Insufficient documentation

## 2020-11-30 DIAGNOSIS — R1032 Left lower quadrant pain: Secondary | ICD-10-CM | POA: Diagnosis not present

## 2020-11-30 DIAGNOSIS — I259 Chronic ischemic heart disease, unspecified: Secondary | ICD-10-CM | POA: Insufficient documentation

## 2020-11-30 DIAGNOSIS — R1310 Dysphagia, unspecified: Secondary | ICD-10-CM | POA: Insufficient documentation

## 2020-11-30 MED ORDER — AMOXICILLIN-POT CLAVULANATE 875-125 MG PO TABS: 1.0000 | ORAL_TABLET | Freq: Two times a day (BID) | ORAL | 0 refills | Status: AC

## 2020-11-30 NOTE — Telephone Encounter (Signed)
Patient has blood in stool, cramps, fever and other symptoms; possible recurrence of diverticulitis. Patient wants to know if she should still take her doses of ELIQUIS 5 MG TABS tablet [[id number]]    Please advise at [phone] or [phone].

## 2020-11-30 NOTE — Progress Notes (Signed)
Subjective:    Patient ID: Tara Brennan, female    DOB: 1951/01/08, 70 y.o.   MRN: [id number]  HPI: Tara Brennan is a 70 y.o. female presenting for rectal bleeding.  Chief Complaint  Patient presents with  . Rectal Bleeding    Diverticulitis in the past 3-4 yrs ago , having cramping last night and this morning. Stools are mucusy and bright red. Records are in chart. Did not take eliquis this morning, having concern with taking that med with the blood in stool. High bp today due to concern of blood in stool. GYN visit last wk bp was 120 70   RECTAL BLEEDING Duration: less than 24 hours Bright red rectal bleeding: yes  Amount of blood: small amount; mucus red and some on TP  Frequency:  Melena: no  Spotting on toilet tissue: yes  Anal fullness: no  Perianal pain: no  Severity:  Perianal irritation/itching: no  Constipation: no  Chronic straining/valsava:  no  Previous colonoscopy: yes   ABDOMINAL PAIN  Duration: last night Onset: sudden  Severity: moderate to severe Quality: cramping Location:  LLQ  Episode duration: comes and goes; last for 1 minute or so Radiation: no Frequency: comes and goes Alleviating factors: nothing Aggravating factors: nothing Status: stable Treatments attempted: nothing tried Fever: no; hot and cold chills Nausea: yes Vomiting: yes x1; thick Weight loss: no Decreased appetite: yes Diarrhea: yes Constipation: no Blood in stool: yes; mucusy-blood Heartburn: no  Jaundice: no Rash: no Dysuria/urinary frequency: no Hematuria: no History of sexually transmitted disease: no Recurrent NSAID use: no     No Known Allergies  Outpatient Encounter Medications as of 11/30/2020  Medication Sig  . acetaminophen (TYLENOL) 500 MG tablet Take 500 mg by mouth every 6 (six) hours as needed.  Marland Kitchen alendronate (FOSAMAX) 70 MG tablet TAKE 1 TABLET BY MOUTH EVERY SEVEN DAYS  . amoxicillin-clavulanate (AUGMENTIN) 875-125 MG tablet Take 1 tablet  by mouth 2 (two) times daily for 7 days.  . cholecalciferol (VITAMIN D) 1000 units tablet Take 2,000 Units by mouth daily.   . cyclobenzaprine (FLEXERIL) 10 MG tablet Take 1 tablet (10 mg total) by mouth 3 (three) times daily as needed for muscle spasms.  Marland Kitchen diltiazem (CARDIZEM CD) 180 MG 24 hr capsule TAKE 1 CAPSULE(180 MG) BY MOUTH DAILY  . ELIQUIS 5 MG TABS tablet TAKE 1 TABLET(5 MG) BY MOUTH TWICE DAILY  . Omega-3 Fatty Acids (FISH OIL) 1000 MG CAPS Take by mouth.  . pantoprazole (PROTONIX) 40 MG tablet Take 40 mg by mouth daily.   No facility-administered encounter medications on file as of 11/30/2020.    Patient Active Problem List   Diagnosis Date Noted  . Diverticulitis 11/30/2020  . Abnormal findings on diagnostic imaging of other parts of digestive tract 11/30/2020  . Chronic ischemic heart disease 11/30/2020  . Dysphagia 11/30/2020  . Esophageal reflux 11/30/2020  . Atrial fibrillation with RVR ([place]) 06/23/2019  . Pneumothorax, right 11/30/2017  . Pain in toe 04/04/2013  . 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 OA (osteoarthritis) of knee 12/07/2011  . Knee pain 12/07/2011  . KNEE, ARTHRITIS, DEGEN./OSTEO 09/14/2009  . KNEE PAIN 09/14/2009    Past Medical History:  Diagnosis Date  . Anxiety   . Atrial fibrillation ([place])    a. diagnosed in 06/2019 with sponatenous conversion back to NSR  . GERD (gastroesophageal reflux disease)   . Osteopenia   . Ovarian cyst     Relevant past medical, surgical, family and  social history reviewed and updated as indicated. Interim medical history since our last visit reviewed.  Review of Systems Per HPI unless specifically indicated above     Objective:    BP (!) 180/110   Pulse 85   Temp (!) 97.5 F (36.4 C)   Ht 5\' 7"  (1.702 m)   Wt 196 lb 9.6 oz (89.2 kg)   SpO2 97%   BMI 30.79 kg/m   Wt Readings from Last 3 Encounters:  11/30/20 196 lb 9.6 oz (89.2 kg)  11/27/20 199 lb (90.3 kg)  09/29/20 196 lb (88.9 kg)     Physical Exam Vitals and nursing note reviewed.  Constitutional:      General: She is not in acute distress.    Appearance: Normal appearance. She is not toxic-appearing.  HENT:     Head: Normocephalic and atraumatic.  Eyes:     General: No scleral icterus.    Extraocular Movements: Extraocular movements intact.  Abdominal:     General: Abdomen is flat. Bowel sounds are decreased. There is no distension.     Palpations: Abdomen is soft. There is no shifting dullness, fluid wave or mass.     Tenderness: There is abdominal tenderness in the left lower quadrant. There is no right CVA tenderness, left CVA tenderness, guarding or rebound.  Musculoskeletal:     Right lower leg: No edema.     Left lower leg: No edema.  Skin:    General: Skin is warm and dry.     Capillary Refill: Capillary refill takes less than 2 seconds.     Coloration: Skin is not jaundiced or pale.     Findings: No erythema.  Neurological:     Mental Status: She is alert and oriented to person, place, and time.     Motor: No weakness.     Gait: Gait normal.  Psychiatric:        Mood and Affect: Mood normal.        Behavior: Behavior normal.        Thought Content: Thought content normal.        Judgment: Judgment normal.       Assessment & Plan:  1. LLQ abdominal pain Acute.  Given history of diverticulitis and presentation today, likely diverticulitis has recurred.  No red flags today - abdomen is soft and without peritoneal signs.  Encouraged patient to start clear liquid diet for next 48 hours.  Discussed risks vs. Benefits of resuming Eliquis - continuing to lose small amount of blood vs. Increasing possible change of acute stroke with history of A-fib.  Plan to resume Eliquis as long as bleeding does not worsen.  Elect to defer CT abdomen/pelvis for now to minimize radiation exposure.  Will start Augmentin twice daily for 7 days for diverticulitis.  Return to clinic if symptoms not improved by Wednesday; will  consider STAT imaging and further work up.  With any acute onset of vomiting and unable to keep fluids down or severe continuous abdominal pain, or fevers, seek emergent care.   - amoxicillin-clavulanate (AUGMENTIN) 875-125 MG tablet; Take 1 tablet by mouth 2 (two) times daily for 7 days.  Dispense: 14 tablet; Refill: 0     Follow up plan: Return if symptoms worsen or fail to improve.

## 2020-11-30 NOTE — Telephone Encounter (Signed)
Call placed to patient.   Reports that she noted abdominal cramping and loose stools last night. Reports that she thinks she has diverticulitis flare. States that during one loose stool, she noted blood tinged mucus.   Advised to schedule OV for evaluation. Appointment scheduled.

## 2020-12-03 ENCOUNTER — Telehealth: Payer: Self-pay

## 2020-12-03 DIAGNOSIS — L988 Other specified disorders of the skin and subcutaneous tissue: Secondary | ICD-10-CM | POA: Diagnosis not present

## 2020-12-03 DIAGNOSIS — D485 Neoplasm of uncertain behavior of skin: Secondary | ICD-10-CM | POA: Diagnosis not present

## 2020-12-03 DIAGNOSIS — H2513 Age-related nuclear cataract, bilateral: Secondary | ICD-10-CM | POA: Diagnosis not present

## 2020-12-03 DIAGNOSIS — H1045 Other chronic allergic conjunctivitis: Secondary | ICD-10-CM | POA: Diagnosis not present

## 2020-12-03 NOTE — Telephone Encounter (Signed)
It looks like you saw her for the upper back pain last week, can you address her question please?  I treated her for LLQ abdominal pain/diverticulitis Monday.

## 2020-12-03 NOTE — Telephone Encounter (Signed)
Needs stat CT chest to rule out PE (get shannon to help order and schedule) Dx; Pleurisy and chest pain

## 2020-12-03 NOTE — Telephone Encounter (Signed)
Patient was seen by NP on 11/30/2020.

## 2020-12-04 ENCOUNTER — Ambulatory Visit
Admission: RE | Admit: 2020-12-04 | Discharge: 2020-12-04 | Disposition: A | Discharge status: Home / Self Care | Payer: PPO | Source: Ambulatory Visit | Attending: Family Medicine | Admitting: Family Medicine

## 2020-12-04 ENCOUNTER — Other Ambulatory Visit: Payer: Self-pay | Admitting: *Deleted

## 2020-12-04 ENCOUNTER — Telehealth: Payer: Self-pay

## 2020-12-04 ENCOUNTER — Other Ambulatory Visit: Payer: Self-pay

## 2020-12-04 DIAGNOSIS — I2510 Atherosclerotic heart disease of native coronary artery without angina pectoris: Secondary | ICD-10-CM | POA: Diagnosis not present

## 2020-12-04 DIAGNOSIS — R091 Pleurisy: Secondary | ICD-10-CM

## 2020-12-04 DIAGNOSIS — M4856XA Collapsed vertebra, not elsewhere classified, lumbar region, initial encounter for fracture: Secondary | ICD-10-CM | POA: Diagnosis not present

## 2020-12-04 DIAGNOSIS — R079 Chest pain, unspecified: Secondary | ICD-10-CM

## 2020-12-04 DIAGNOSIS — R0781 Pleurodynia: Secondary | ICD-10-CM

## 2020-12-04 DIAGNOSIS — K449 Diaphragmatic hernia without obstruction or gangrene: Secondary | ICD-10-CM | POA: Diagnosis not present

## 2020-12-04 DIAGNOSIS — E041 Nontoxic single thyroid nodule: Secondary | ICD-10-CM

## 2020-12-04 DIAGNOSIS — J9811 Atelectasis: Secondary | ICD-10-CM | POA: Diagnosis not present

## 2020-12-04 MED ORDER — IOPAMIDOL (ISOVUE-370) INJECTION 76%
75.0000 mL | Freq: Once | INTRAVENOUS | Status: AC | PRN
  Administered 2020-12-04: 75 mL via INTRAVENOUS

## 2020-12-04 NOTE — Telephone Encounter (Signed)
Orders placed for CT Chest Angio.   Message sent to coordinator.

## 2020-12-04 NOTE — Telephone Encounter (Signed)
Per referral coordinator Pilar Grammes.Marland KitchenMarland KitchenMarland KitchenCould not reach pt, LVM w/instructions: Pt is to go to [address] before 3pm today, Nothing to eat 4hrs prior, but can have liquids.

## 2020-12-04 NOTE — Telephone Encounter (Signed)
Spoke with pt, preparing to go to have imaging done

## 2020-12-07 ENCOUNTER — Other Ambulatory Visit: Payer: Self-pay

## 2020-12-07 ENCOUNTER — Encounter: Payer: Self-pay | Admitting: Family Medicine

## 2020-12-07 ENCOUNTER — Ambulatory Visit (INDEPENDENT_AMBULATORY_CARE_PROVIDER_SITE_OTHER): Payer: PPO | Admitting: Family Medicine

## 2020-12-07 VITALS — BP 122/60 | HR 88 | Temp 98.3°F | Ht 67.0 in | Wt 196.0 lb

## 2020-12-07 DIAGNOSIS — E041 Nontoxic single thyroid nodule: Secondary | ICD-10-CM | POA: Diagnosis not present

## 2020-12-07 MED ORDER — GABAPENTIN 300 MG PO CAPS: 300.0000 mg | ORAL_CAPSULE | Freq: Three times a day (TID) | ORAL | 3 refills | Status: DC

## 2020-12-07 NOTE — Progress Notes (Signed)
Subjective:    Patient ID: Tara Brennan, female    DOB: 08/31/1950, 70 y.o.   MRN: [id number]  HPI  11/27/20 Yesterday, the patient developed sudden severe pain in her back just below her shoulder blade.  It radiated around underneath her breast.  It hurt to breathe then.  It hurts to cough.  It hurts to move.  It began after she reached awkwardly into a recycling bin and felt a popping sensation in her right mid axillary line.  The pain is better this morning.  She denies any cough or fever or pleurisy or hemoptysis.  She does have some mild tenderness to palpation along the ribs in the midaxillary line.  She denies any severe pain with inspiration.  She denies any shortness of breath or fever or chills.  At that time, my plan was: Patient is having chest wall pain.  I believe she may have bruised or even cracked a rib or possibly strained an intercostal muscle.  Proceed with x-rays of the rib.  Use Flexeril 10 mg every 8 hours as needed for pain along with Tylenol.  I do not believe that a blood clot is likely given the fact she is on Eliquis and her breath sounds are equal and symmetric so I do not feel that she has a pneumothorax.  12/07/20 Pain worsened so we got CT to rule out PE.   FINDINGS: Cardiovascular: The heart size is normal. No substantial pericardial effusion. Atherosclerotic calcification is noted in the wall of the thoracic aorta. There is no filling defect within the opacified pulmonary arteries to suggest the presence of an acute pulmonary embolus.  Mediastinum/Nodes: No mediastinal lymphadenopathy. There is no hilar lymphadenopathy. Tiny hiatal hernia. The esophagus has normal imaging features. There is no axillary lymphadenopathy. 2.9 cm left thyroid nodule evident.  Lungs/Pleura: No suspicious pulmonary nodule or mass. No focal airspace consolidation. No pleural effusion. Subsegmental atelectasis noted in the lung bases bilaterally.  Upper Abdomen: The liver  shows diffusely decreased attenuation suggesting fat deposition.  Musculoskeletal: No worrisome lytic or sclerotic osseous abnormality. L1 compression fracture noted.    Patient continues to have pain originating in the center of her back radiating around her right side directly below her right breast.  It gets worse if she twists to her left side.  It feels better if she supports her breast tissue with her hand.  It certainly sounds musculoskeletal in nature or perhaps neuropathic.  She does complain of it being a lancing shooting type pain.  Again there is no evidence of shingles.  There is no rash on the skin.  She denies any fevers or chills.  She did have a L1 compression fracture and she has known osteoporosis for which she is taking Fosamax however the compression fracture is far below the source of her pain.  There was a coincidental finding of a 2.9 cm left thyroid nodule. Past Medical History:  Diagnosis Date  . Anxiety   . Atrial fibrillation ([place])    a. diagnosed in 06/2019 with sponatenous conversion back to NSR  . GERD (gastroesophageal reflux disease)   . Osteopenia   . Ovarian cyst      Past Surgical History:  Procedure Laterality Date  . ABDOMINAL HYSTERECTOMY  1984  . ESOPHAGOGASTRODUODENOSCOPY  06/28/2012   Procedure: ESOPHAGOGASTRODUODENOSCOPY (EGD);  Surgeon: Jeryl Columbia, MD;  Location: [facility] ENDOSCOPY;  Service: Endoscopy;  Laterality: N/A;  . OVARIAN CYST SURGERY    . PARTIAL HYSTERECTOMY  Current Outpatient Medications on File Prior to Visit  Medication Sig Dispense Refill  . acetaminophen (TYLENOL) 500 MG tablet Take 500 mg by mouth every 6 (six) hours as needed.    Marland Kitchen alendronate (FOSAMAX) 70 MG tablet TAKE 1 TABLET BY MOUTH EVERY SEVEN DAYS 12 tablet 3  . amoxicillin-clavulanate (AUGMENTIN) 875-125 MG tablet Take 1 tablet by mouth 2 (two) times daily for 7 days. 14 tablet 0  . cholecalciferol (VITAMIN D) 1000 units tablet Take 2,000 Units by mouth daily.      . cyclobenzaprine (FLEXERIL) 10 MG tablet Take 1 tablet (10 mg total) by mouth 3 (three) times daily as needed for muscle spasms. 30 tablet 0  . diltiazem (CARDIZEM CD) 180 MG 24 hr capsule TAKE 1 CAPSULE(180 MG) BY MOUTH DAILY 90 capsule 3  . ELIQUIS 5 MG TABS tablet TAKE 1 TABLET(5 MG) BY MOUTH TWICE DAILY 180 tablet 3  . Omega-3 Fatty Acids (FISH OIL) 1000 MG CAPS Take by mouth.    . pantoprazole (PROTONIX) 40 MG tablet Take 40 mg by mouth daily.     No current facility-administered medications on file prior to visit.   No Known Allergies Social History   Socioeconomic History  . Marital status: Married    Spouse name: Not on file  . Number of children: Not on file  . Years of education: Not on file  . Highest education level: Not on file  Occupational History  . Not on file  Tobacco Use  . Smoking status: Never Smoker  . Smokeless tobacco: Never Used  Vaping Use  . Vaping Use: Never used  Substance and Sexual Activity  . Alcohol use: No  . Drug use: No  . Sexual activity: Yes    Birth control/protection: Surgical  Other Topics Concern  . Not on file  Social History Narrative   ** Merged History Encounter **       Social Determinants of Health   Financial Resource Strain: Not on file  Food Insecurity: Not on file  Transportation Needs: Not on file  Physical Activity: Not on file  Stress: Not on file  Social Connections: Not on file  Intimate Partner Violence: Not on file    Review of Systems     Objective:   Physical Exam Vitals reviewed.  Constitutional:      Appearance: She is normal weight.  Cardiovascular:     Rate and Rhythm: Normal rate and regular rhythm.     Pulses: Normal pulses.     Heart sounds: Normal heart sounds. No murmur heard. No friction rub. No gallop.   Pulmonary:     Effort: Pulmonary effort is normal. No respiratory distress.     Breath sounds: Normal breath sounds. No wheezing, rhonchi or rales.  Chest:     Chest wall:  Tenderness present. No mass, deformity, swelling, crepitus or edema. There is no dullness to percussion.    Abdominal:     General: Abdomen is flat. Bowel sounds are normal. There is no distension.     Palpations: Abdomen is soft. There is no mass.     Tenderness: There is no abdominal tenderness. There is no guarding.     Hernia: No hernia is present.  Musculoskeletal:       Back:     Right lower leg: No edema.     Left lower leg: No edema.  Neurological:     Mental Status: She is alert.  Assessment & Plan:  Thyroid nodule - Plan: US THYROID  I believe the pain could either be a torn intercostal muscle or perhaps a pinched nerve in her thoracic spine given the distribution of the pain.  We will treat it as a neuropathic pain with gabapentin 300 mg p.o. 3 times daily as needed pain and clinically monitor it.  If worsening I will get an MRI of the thoracic spine.  However I will plan her for an ultrasound of the thyroid gland and if the nodule is confirmed, she will require a needle biopsy through interventional radiology.

## 2020-12-11 ENCOUNTER — Ambulatory Visit (HOSPITAL_COMMUNITY)
Admission: RE | Admit: 2020-12-11 | Discharge: 2020-12-11 | Disposition: A | Discharge status: Home / Self Care | Payer: PPO | Source: Ambulatory Visit | Attending: Family Medicine | Admitting: Family Medicine

## 2020-12-11 DIAGNOSIS — E041 Nontoxic single thyroid nodule: Secondary | ICD-10-CM | POA: Diagnosis not present

## 2020-12-11 DIAGNOSIS — E042 Nontoxic multinodular goiter: Secondary | ICD-10-CM | POA: Diagnosis not present

## 2020-12-14 ENCOUNTER — Other Ambulatory Visit: Payer: Self-pay | Admitting: Family Medicine

## 2020-12-14 DIAGNOSIS — E041 Nontoxic single thyroid nodule: Secondary | ICD-10-CM

## 2020-12-16 ENCOUNTER — Telehealth: Payer: Self-pay | Admitting: Family Medicine

## 2020-12-16 NOTE — Telephone Encounter (Signed)
This patient called to express concern about her recent ultrasound; awaiting call from provider to discuss results. Please advise at [phone]

## 2020-12-16 NOTE — Telephone Encounter (Signed)
Please see results for further information.

## 2020-12-21 ENCOUNTER — Telehealth: Payer: Self-pay | Admitting: *Deleted

## 2020-12-21 ENCOUNTER — Other Ambulatory Visit: Payer: Self-pay | Admitting: *Deleted

## 2020-12-21 DIAGNOSIS — E041 Nontoxic single thyroid nodule: Secondary | ICD-10-CM

## 2020-12-21 NOTE — Telephone Encounter (Addendum)
Received call from patient.   Inquired about status of Thyroid Biopsy.   Advised that referral has been placed to [facility] IR. States that she would prefer to go to [facility] if able.   Please cancel referral for IR.   Order for FNA placed.

## 2020-12-22 ENCOUNTER — Encounter (HOSPITAL_COMMUNITY): Payer: Self-pay | Admitting: Radiology

## 2020-12-22 ENCOUNTER — Telehealth: Payer: Self-pay | Admitting: Pharmacist Clinician (PhC)/ Clinical Pharmacy Specialist

## 2020-12-22 NOTE — Telephone Encounter (Signed)
Patient with diagnosis of atrial fibrillation on Eliquis for anticoagulation.    Procedure: thyroid biopsy Date of procedure: TBD   CHA2DS2-VASc Score = 3  This indicates a 3.2% annual risk of stroke. The patient's score is based upon: CHF History: No HTN History: Yes Diabetes History: No Stroke History: No Vascular Disease History: No Age Score: 1 Gender Score: 1   CrCl 94.5 Platelet count 305  Per office protocol, patient can hold Eliquis for 2 days prior to procedure.   Patient will not need bridging with Lovenox (enoxaparin) around procedure.

## 2020-12-22 NOTE — Progress Notes (Signed)
            Tara Brennan. Tara Brennan" Female, 70 y.o., October 06, 1950  MRN:  [id number] Phone:  [phone] (H) ...        PCP:  Susy Frizzle, MD Primary Cvg:  [employer] [facility]  Next Appt With Cardiology 02/16/2021 at 8:20 AM             Advice Only (Clearance - thyroid biopsy)   Richardson Dopp T, PA-C routed conversation to You; Susy Frizzle, MD 15 minutes ago (8:51 AM)    Rockne Menghini, RPH-CPP routed conversation to Cv Div Preop 1 hour ago (7:21 AM)   Rockne Menghini, RPH-CPP 1 hour ago (7:21 AM)       Patient with diagnosis of atrial fibrillation on Eliquis for anticoagulation.    Procedure: thyroid biopsy Date of procedure: TBD   CHA2DS2-VASc Score = 3  This indicates a 3.2% annual risk of stroke. The patient's score is based upon: CHF History: No HTN History: Yes Diabetes History: No Stroke History: No Vascular Disease History: No Age Score: 1 Gender Score: 1   CrCl 94.5 Platelet count 305  Per office protocol, patient can hold Eliquis for 2 days prior to procedure.   Patient will not need bridging with Lovenox (enoxaparin) around procedure.        Documentation    Alv[place], Casimiro Needle, RPH-CPP 1 hour ago (7:15 AM)       ----- Message from Imogene Burn, PA-C sent at 12/21/2020  4:01 PM EDT ----- Regarding: FW: US Thyroid Biopsy / Eliquis I'm sending this to pharmacy preop op pool I've never seen this patient. Thanks ----- Message ----- From: Garth Bigness D Sent: 12/21/2020   3:52 PM EDT To: Imogene Burn, PA-C, # Subject: US Thyroid Biopsy / Eliquis                    Hi Selinda Eon, Dr. Jenna Luo has sent in a request for patient to have a Thyroid Biopsy. Patient is on Eliquis and will need to hold it for 2 days prior to Biopsy. I just need to know if it is okay to hold.  Thanks Aniceto Boss

## 2020-12-22 NOTE — Telephone Encounter (Signed)
-----   Message from Imogene Burn, PA-C sent at 12/21/2020  4:01 PM EDT ----- Regarding: FW: US Thyroid Biopsy / Eliquis I'm sending this to pharmacy preop op pool I've never seen this patient. Thanks ----- Message ----- From: Garth Bigness D Sent: 12/21/2020   3:52 PM EDT To: Imogene Burn, PA-C, # Subject: US Thyroid Biopsy / Eliquis                    Hi Selinda Eon, Dr. Jenna Luo has sent in a request for patient to have a Thyroid Biopsy. Patient is on Eliquis and will need to hold it for 2 days prior to Biopsy. I just need to know if it is okay to hold.  Thanks Aniceto Boss

## 2020-12-30 ENCOUNTER — Encounter (HOSPITAL_COMMUNITY): Payer: Self-pay

## 2020-12-30 ENCOUNTER — Ambulatory Visit (HOSPITAL_COMMUNITY)
Admission: RE | Admit: 2020-12-30 | Discharge: 2020-12-30 | Disposition: A | Discharge status: Home / Self Care | Payer: PPO | Source: Ambulatory Visit | Attending: Family Medicine | Admitting: Family Medicine

## 2020-12-30 DIAGNOSIS — E041 Nontoxic single thyroid nodule: Secondary | ICD-10-CM | POA: Insufficient documentation

## 2020-12-30 MED ORDER — LIDOCAINE HCL (PF) 2 % IJ SOLN
INTRAMUSCULAR | Status: AC
  Administered 2020-12-30: 10 mL
  Filled 2020-12-30: qty 10

## 2020-12-30 MED ORDER — LIDOCAINE HCL (PF) 2 % IJ SOLN: 10.0000 mL | Freq: Once | INTRAMUSCULAR | Status: AC

## 2020-12-30 MED ORDER — LIDOCAINE HCL (PF) 2 % IJ SOLN
INTRAMUSCULAR | Status: AC
  Filled 2020-12-30: qty 10

## 2020-12-30 NOTE — Sedation Documentation (Signed)
PT tolerated thyroid biopsy procedure well today. Labs obtained and sent for pathology. PT ambulatory at discharge with no acute distress noted, given ice pack and verbalized understanding of discharge instructions.

## 2020-12-30 NOTE — Discharge Instructions (Signed)
Post Operative Instructions for Thyroid Biopsy  1. Keep pressure bandage over site of biopsy for 3-4 hours after leaving office.  2. Normal activity is permitted as long as it does not involve anything strenuous (i.e., jogging, heavy lifting, etc.).  3. Some minor pain may be experienced when the local anesthesia wears off, and should be relieved with Tylenol, Advil, or ibuprofen.  4. You may experience some bruising and/or swelling at the site of the biopsy.  5. If you should develop severe pain, difficulty swallowing or difficulty breathing, please call the office or  Hospital at [phone].  6. Apply ice pack for 20 minutes this evening.      

## 2020-12-31 LAB — CYTOLOGY - NON PAP

## 2021-02-16 ENCOUNTER — Encounter: Payer: Self-pay | Admitting: Cardiology

## 2021-02-16 ENCOUNTER — Ambulatory Visit: Payer: PPO | Admitting: Surgical

## 2021-02-16 ENCOUNTER — Ambulatory Visit: Payer: PPO | Admitting: Cardiology

## 2021-02-16 ENCOUNTER — Other Ambulatory Visit: Payer: Self-pay

## 2021-02-16 VITALS — BP 150/90 | HR 74 | Ht 67.5 in | Wt 194.0 lb

## 2021-02-16 DIAGNOSIS — I480 Paroxysmal atrial fibrillation: Secondary | ICD-10-CM

## 2021-02-16 DIAGNOSIS — D6869 Other thrombophilia: Secondary | ICD-10-CM

## 2021-02-16 NOTE — Patient Instructions (Signed)

## 2021-02-16 NOTE — Progress Notes (Signed)
Cardiology Office Note  Date: 02/16/2021   ID: Maurice, Ramseur Dec 06, 1950, MRN [id number]  PCP:  Susy Frizzle, MD  Cardiologist:  Rozann Lesches, MD Electrophysiologist:  None   Chief Complaint  Patient presents with   Cardiac follow-up     History of Present Illness: Tara Brennan is a 70 y.o. female former patient of Dr. Bronson Ing now presenting to establish follow-up with me.  I reviewed her records and updated the chart.  She was last seen December 2021 by Ms. Strader PA-C.  She presents reporting no sense of palpitations or chest discomfort.  I reviewed her medications which are noted below, she reports compliance and no obvious intolerances.  CHA2DS2-VASc score is 3.  She did ask about possibly stopping anticoagulation and going back to aspirin.  Based on her thromboembolic risk score, would recommend continuing anticoagulation however.  She could potentially be having asymptomatic, paroxysmal atrial fibrillation and remain at risk for stroke.  I reviewed her lab work from March  Past Medical History:  Diagnosis Date   Anxiety    GERD (gastroesophageal reflux disease)    Osteopenia    Ovarian cyst    Paroxysmal atrial fibrillation [facility])    Diagnosed November 2020    Past Surgical History:  Procedure Laterality Date   ABDOMINAL HYSTERECTOMY  1984   ESOPHAGOGASTRODUODENOSCOPY  06/28/2012   Procedure: ESOPHAGOGASTRODUODENOSCOPY (EGD);  Surgeon: Jeryl Columbia, MD;  Location: [facility] ENDOSCOPY;  Service: Endoscopy;  Laterality: N/A;   OVARIAN CYST SURGERY     PARTIAL HYSTERECTOMY      Current Outpatient Medications  Medication Sig Dispense Refill   acetaminophen (TYLENOL) 500 MG tablet Take 500 mg by mouth every 6 (six) hours as needed.     alendronate (FOSAMAX) 70 MG tablet TAKE 1 TABLET BY MOUTH EVERY SEVEN DAYS 12 tablet 3   cholecalciferol (VITAMIN D) 1000 units tablet Take 2,000 Units by mouth daily.      diltiazem (CARDIZEM CD) 180 MG 24 hr capsule  TAKE 1 CAPSULE(180 MG) BY MOUTH DAILY 90 capsule 3   ELIQUIS 5 MG TABS tablet TAKE 1 TABLET(5 MG) BY MOUTH TWICE DAILY 180 tablet 3   Omega-3 Fatty Acids (FISH OIL) 1000 MG CAPS Take by mouth.     pantoprazole (PROTONIX) 40 MG tablet Take 40 mg by mouth daily.     No current facility-administered medications for this visit.   Allergies:  Patient has no known allergies.   ROS: No syncope.  Physical Exam: VS:  BP (!) 150/90   Pulse 74   Ht 5' 7.5" (1.715 m)   Wt 194 lb (88 kg)   SpO2 97%   BMI 29.94 kg/m , BMI Body mass index is 29.94 kg/m.  Wt Readings from Last 3 Encounters:  02/16/21 194 lb (88 kg)  12/07/20 196 lb (88.9 kg)  11/30/20 196 lb 9.6 oz (89.2 kg)    General: Patient appears comfortable at rest. HEENT: Conjunctiva and lids normal, wearing a mask. Neck: Supple, no elevated JVP or carotid bruits, no thyromegaly. Lungs: Clear to auscultation, nonlabored breathing at rest. Cardiac: Regular rate and rhythm, no S3 or significant systolic murmur, no pericardial rub. Extremities: No pitting edema.  ECG:  An ECG dated 07/09/2020 was personally reviewed today and demonstrated:  Sinus rhythm.  Recent Labwork: 09/29/2020: Hemoglobin 13.0; Platelets 305 10/20/2020: ALT 36; AST 27; BUN 18; Creat 0.80; Potassium 4.9; Sodium 139     Component Value Date/Time   CHOL 200 (H) 09/29/2020  1031   TRIG 75 09/29/2020 1031   HDL 65 09/29/2020 1031   CHOLHDL 3.1 09/29/2020 1031   LDLCALC 118 (H) 09/29/2020 1031    Other Studies Reviewed Today:  Echocardiogram 06/23/2019:  1. Left ventricular ejection fraction, by visual estimation, is 60 to  65%. The left ventricle has normal function. There is mildly increased  left ventricular hypertrophy.   2. Left ventricular diastolic function could not be evaluated.   3. The left ventricle has no regional wall motion abnormalities.   4. Global right ventricle has normal systolic function.The right  ventricular size is normal. No increase  in right ventricular wall  thickness.   5. Left atrial size was normal.   6. Right atrial size was normal.   7. The mitral valve is grossly normal. Mild mitral valve regurgitation.   8. The tricuspid valve is grossly normal. Tricuspid valve regurgitation  is trivial.   9. The aortic valve is tricuspid. Aortic valve regurgitation is not  visualized. No evidence of aortic valve sclerosis or stenosis.  10. The pulmonic valve was grossly normal. Pulmonic valve regurgitation is  not visualized.  11. The inferior vena cava is normal in size with greater than 50%  respiratory variability, suggesting right atrial pressure of 3 mmHg.   Assessment and Plan:  1.  Paroxysmal atrial fibrillation with CHA2DS2-VASc score of 3.  She is asymptomatic in terms of palpitations and heart rate is regular today.  ECG from December 2021 showed sinus rhythm.  Recommend continuing anticoagulation to reduce stroke risk, she remains on Eliquis and also on Cardizem CD.  2.  Acquired thrombophilia, on Eliquis for stroke prophylaxis.  No spontaneous bleeding problems.  I reviewed her interval lab work.  Medication Adjustments/Labs and Tests Ordered: Current medicines are reviewed at length with the patient today.  Concerns regarding medicines are outlined above.   Tests Ordered: No orders of the defined types were placed in this encounter.   Medication Changes: No orders of the defined types were placed in this encounter.   Disposition:  Follow up  6 months.  Signed, Satira Sark, MD, [facility] 02/16/2021 8:32 AM    [facility] at [facility] [address] [place], [place] [address] Phone: [phone]; Fax: [phone]

## 2021-02-17 ENCOUNTER — Telehealth: Payer: Self-pay | Admitting: Cardiology

## 2021-02-17 NOTE — Telephone Encounter (Signed)
Patient calling the office for samples of medication:   1.  What medication and dosage are you requesting samples for? ELIQUIS 5 MG  2.  Are you currently out of this medication? YES  Patient states that she cannot refill her medication until she gets paid.   Please call [phone]

## 2021-02-18 NOTE — Telephone Encounter (Signed)
I left message for patient that I did not have any samples available for her at this time.

## 2021-02-24 ENCOUNTER — Encounter: Payer: Self-pay | Admitting: Orthopedic Surgery

## 2021-02-24 ENCOUNTER — Ambulatory Visit (INDEPENDENT_AMBULATORY_CARE_PROVIDER_SITE_OTHER): Payer: PPO

## 2021-02-24 ENCOUNTER — Ambulatory Visit: Payer: PPO | Admitting: Orthopedic Surgery

## 2021-02-24 ENCOUNTER — Other Ambulatory Visit: Payer: Self-pay

## 2021-02-24 DIAGNOSIS — M25512 Pain in left shoulder: Secondary | ICD-10-CM | POA: Diagnosis not present

## 2021-02-24 DIAGNOSIS — M7542 Impingement syndrome of left shoulder: Secondary | ICD-10-CM | POA: Diagnosis not present

## 2021-02-24 NOTE — Progress Notes (Signed)
Office Visit Note   Patient: Tara Brennan           Date of Birth: 15-Apr-1951           MRN: [id number] Visit Date: 02/24/2021 Requested by: Susy Frizzle, MD [address] [place],  [place] [address] PCP: Susy Frizzle, MD  Subjective: Chief Complaint  Patient presents with   Left Shoulder - Pain    HPI: Tara Brennan is a 70 y.o. female who presents to the office complaining of left shoulder pain.  Patient complains of increased left shoulder pain over the last several months without injury.  She localizes pain to the anterior and lateral aspects of the left shoulder.  No radiation down the arm, radicular pain, numbness/tingling, neck pain.  Pain does not wake her up at night.  She mostly complains of pain when she lifts things with her arm away from her body.  She has no history of previous surgery to the shoulder.  She does have a history of pain in the shoulder several years ago and has had 2 injections with good relief for several months to years with injections..                ROS: All systems reviewed are negative as they relate to the chief complaint within the history of present illness.  Patient denies fevers or chills.  Assessment & Plan: Visit Diagnoses:  1. Acute pain of left shoulder     Plan: Patient is a 70 year old female who presents complaint of left shoulder pain.  She has increased shoulder pain over the last several months.  This feels similar to previous episodes of shoulder pain that were successfully treated with shoulder injections.  Radiographs taken today are negative for any acute changes since prior radiographs in 2017.  She does have increased pain with lifting but no rotator cuff weakness or crepitus noted on exam today.  Plan to try cortisone injection today and have her follow-up as needed.  If she has no lasting relief with this injection, consider MRI for evaluation of biceps tendon pathology or partial-thickness rotator cuff  tear.  Patient agreed with plan.  Follow-Up Instructions: Return if symptoms worsen or fail to improve.   Orders:  Orders Placed This Encounter  Procedures   XR Shoulder Left   No orders of the defined types were placed in this encounter.     Procedures: Large Joint Inj: L subacromial bursa on 02/24/2021 11:41 AM Indications: diagnostic evaluation and pain Details: 18 G 1.5 in needle, posterior approach  Arthrogram: No  Medications: 9 mL bupivacaine 0.5 %; 40 mg methylPREDNISolone acetate 40 MG/ML; 5 mL lidocaine 1 % Outcome: tolerated well, no immediate complications Procedure, treatment alternatives, risks and benefits explained, specific risks discussed. Consent was given by the patient. Immediately prior to procedure a time out was called to verify the correct patient, procedure, equipment, support staff and site/side marked as required. Patient was prepped and draped in the usual sterile fashion.      Clinical Data: No additional findings.  Objective: Vital Signs: There were no vitals taken for this visit.  Physical Exam:  Constitutional: Patient appears well-developed HEENT:  Head: Normocephalic Eyes:EOM are normal Neck: Normal range of motion Cardiovascular: Normal rate Pulmonary/chest: Effort normal Neurologic: Patient is alert Skin: Skin is warm Psychiatric: Patient has normal mood and affect  Ortho Exam: Ortho exam demonstrates left shoulder with 70 degrees external rotation, 120 degrees abduction, 175 degrees  forward flexion.  This compared with the right shoulder with 70 degrees external rotation, 110 degrees abduction, 170 degrees forward flexion.  No crepitus noted with passive motion shoulder.  Mild tenderness over the [facility] joint.  Moderate tenderness over the bicipital groove.  5/5 motor strength of bilateral grip strength, finger abduction, pronation/supination, bicep, tricep, deltoid.  Excellent strength of supra, infra, subscap with slight weakness of  external rotation that is more due to pain than actual weakness.  Negative Hornblower sign.  Negative drop arm test.  Negative belly press test.  Specialty Comments:  No specialty comments available.  Imaging: XR Shoulder Left  Result Date: 02/24/2021 AP, scapular Y, axillary views of left shoulder reviewed.  No significant degenerative changes of the glenohumeral or acromioclavicular joint.  No shoulder dislocation or fracture.  Mild enthesopathic changes at the greater tuberosity.  No loss of acromiohumeral interval.    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   OA (osteoarthritis) of knee 12/07/2011   Knee pain 12/07/2011   KNEE, ARTHRITIS, DEGEN./OSTEO 09/14/2009   KNEE PAIN 09/14/2009   Past Medical History:  Diagnosis Date   Anxiety    GERD (gastroesophageal reflux disease)    Osteopenia    Ovarian cyst    Paroxysmal atrial fibrillation [facility])    Diagnosed November 2020    Family History  Problem Relation Age of Onset   Hypertension Mother    Cancer Father    Diabetes Sister    Arthritis Other     Past Surgical History:  Procedure Laterality Date   ABDOMINAL HYSTERECTOMY  1984   ESOPHAGOGASTRODUODENOSCOPY  06/28/2012   Procedure: ESOPHAGOGASTRODUODENOSCOPY (EGD);  Surgeon: Jeryl Columbia, MD;  Location: [facility] ENDOSCOPY;  Service: Endoscopy;  Laterality: N/A;   OVARIAN CYST SURGERY     PARTIAL HYSTERECTOMY     Social History   Occupational History   Not on file  Tobacco Use   Smoking status: Never   Smokeless tobacco: Never  Vaping Use   Vaping Use: Never used  Substance and Sexual  Activity   Alcohol use: No   Drug use: No   Sexual activity: Yes    Birth control/protection: Surgical

## 2021-02-28 ENCOUNTER — Encounter: Payer: Self-pay | Admitting: Orthopedic Surgery

## 2021-02-28 MED ORDER — BUPIVACAINE HCL 0.5 % IJ SOLN
9.0000 mL | INTRAMUSCULAR | Status: AC | PRN
  Administered 2021-02-24: 9 mL via INTRA_ARTICULAR

## 2021-02-28 MED ORDER — LIDOCAINE HCL 1 % IJ SOLN
5.0000 mL | INTRAMUSCULAR | Status: AC | PRN
  Administered 2021-02-24: 5 mL

## 2021-02-28 MED ORDER — METHYLPREDNISOLONE ACETATE 40 MG/ML IJ SUSP
40.0000 mg | INTRAMUSCULAR | Status: AC | PRN
  Administered 2021-02-24: 40 mg via INTRA_ARTICULAR

## 2021-03-30 ENCOUNTER — Ambulatory Visit (INDEPENDENT_AMBULATORY_CARE_PROVIDER_SITE_OTHER): Payer: PPO | Admitting: Family Medicine

## 2021-03-30 ENCOUNTER — Other Ambulatory Visit: Payer: Self-pay

## 2021-03-30 ENCOUNTER — Encounter: Payer: Self-pay | Admitting: Family Medicine

## 2021-03-30 VITALS — BP 138/84 | HR 81 | Temp 97.2°F | Ht 67.5 in | Wt 191.0 lb

## 2021-03-30 DIAGNOSIS — I480 Paroxysmal atrial fibrillation: Secondary | ICD-10-CM

## 2021-03-30 NOTE — Progress Notes (Signed)
Subjective:    Patient ID: Tara Brennan, female    DOB: [birth year], 70 y.o.   MRN: [id number]  HPI  Patient is a very pleasant 70 year old Caucasian female who presents today for a checkup.  Nov 2020, she had an episode of atrial fibrillation.  She is uncertain as to what caused it.  She went into rapid ventricular response and went to the emergency room.  After receiving IV Cardizem, she spontaneously converted back to normal sinus rhythm and she remains in normal sinus rhythm today.  She continues to take Cardizem.  She denies any chest pain shortness of breath or dyspnea on exertion.  She denies any syncope or near syncope.  She remains on Eliquis as an anticoagulant.  We discussed the rationale behind remaining on Eliquis for primary prevention of stroke.  She would like to discontinue Eliquis due to cost.  She is also concerned about possible bleeding risk.  I calculated her CHA2DS2-VASc 2 score to be 3.  This gives her between a 4 and 5% risk of stroke every year.  I explained that Eliquis helps to reduce that is much as possible.  Ultimately it would be her decision.  We could switch to a full dose aspirin however I explained to the patient that this would not be equivalent.  At the present time she elects to remain on Eliquis. Past Medical History:  Diagnosis Date   Anxiety    GERD (gastroesophageal reflux disease)    Osteopenia    Ovarian cyst    Paroxysmal atrial fibrillation [facility])    Diagnosed November 2020     Past Surgical History:  Procedure Laterality Date   ABDOMINAL HYSTERECTOMY  1984   ESOPHAGOGASTRODUODENOSCOPY  06/28/2012   Procedure: ESOPHAGOGASTRODUODENOSCOPY (EGD);  Surgeon: Jeryl Columbia, MD;  Location[facility] ENDOSCOPY;  Service: Endoscopy;  Laterality: N/A;   OVARIAN CYST SURGERY     PARTIAL HYSTERECTOMY     Current Outpatient Medications on File Prior to Visit  Medication Sig Dispense Refill   acetaminophen (TYLENOL) 500 MG tablet Take 500 mg by mouth every 6  (six) hours as needed.     alendronate (FOSAMAX) 70 MG tablet TAKE 1 TABLET BY MOUTH EVERY SEVEN DAYS 12 tablet 3   cholecalciferol (VITAMIN D) 1000 units tablet Take 2,000 Units by mouth daily.      diltiazem (CARDIZEM CD) 180 MG 24 hr capsule TAKE 1 CAPSULE(180 MG) BY MOUTH DAILY 90 capsule 3   ELIQUIS 5 MG TABS tablet TAKE 1 TABLET(5 MG) BY MOUTH TWICE DAILY 180 tablet 3   Omega-3 Fatty Acids (FISH OIL) 1000 MG CAPS Take by mouth.     pantoprazole (PROTONIX) 40 MG tablet Take 40 mg by mouth daily.     No current facility-administered medications on file prior to visit.   No Known Allergies Social History   Socioeconomic History   Marital status: Married    Spouse name: Not on file   Number of children: Not on file   Years of education: Not on file   Highest education level: Not on file  Occupational History   Not on file  Tobacco Use   Smoking status: Never   Smokeless tobacco: Never  Vaping Use   Vaping Use: Never used  Substance and Sexual Activity   Alcohol use: No   Drug use: No   Sexual activity: Yes    Birth control/protection: Surgical  Other Topics Concern   Not on file  Social History Narrative   **  Merged History Encounter **       Social Determinants of Health   Financial Resource Strain: Not on file  Food Insecurity: Not on file  Transportation Needs: Not on file  Physical Activity: Not on file  Stress: Not on file  Social Connections: Not on file  Intimate Partner Violence: Not on file    Review of Systems     Objective:   Physical Exam Vitals reviewed.  Constitutional:      Appearance: She is normal weight.  Cardiovascular:     Rate and Rhythm: Normal rate and regular rhythm.     Pulses: Normal pulses.     Heart sounds: Normal heart sounds. No murmur heard.   No friction rub. No gallop.  Pulmonary:     Effort: Pulmonary effort is normal. No respiratory distress.     Breath sounds: Normal breath sounds. No wheezing, rhonchi or rales.   Abdominal:     General: Abdomen is flat. Bowel sounds are normal. There is no distension.     Palpations: Abdomen is soft. There is no mass.     Tenderness: There is no abdominal tenderness. There is no guarding.     Hernia: No hernia is present.  Musculoskeletal:     Right lower leg: No edema.     Left lower leg: No edema.  Neurological:     Mental Status: She is alert.          Assessment & Plan:  Paroxysmal atrial fibrillation ([place]) - Plan: CBC with Differential/Platelet, COMPLETE METABOLIC PANEL WITH GFR, Lipid panel, TSH  Patient is currently in normal sinus rhythm.  Her heart rate is well controlled on Cardizem.  She denies any hypotension or bradycardia or lightheadedness or syncope.  I will make no changes in her Cardizem or her Eliquis.  I will check a CBC to monitor for any anemia or evidence of blood loss.  I will also monitor her renal function with a CMP as well as checking a fasting lipid panel to screen for hyperlipidemia.  Patient would also like to check a TSH to monitor for any hypothyroidism.  We discussed the cost and the risk and benefits of Eliquis and she elects to stay on Eliquis at the present time

## 2021-03-31 LAB — LIPID PANEL
Cholesterol: 241 mg/dL — ABNORMAL HIGH (ref <200)
HDL: 59 mg/dL (ref >=50)
LDL Cholesterol (Calc): 157 mg/dL (calc) — ABNORMAL HIGH
Non-HDL Cholesterol (Calc): 182 mg/dL (calc) — ABNORMAL HIGH (ref <130)
Total CHOL/HDL Ratio: 4.1 (calc) (ref <5.0)
Triglycerides: 128 mg/dL (ref <150)

## 2021-03-31 LAB — CBC WITH DIFFERENTIAL/PLATELET
Absolute Monocytes: 490 cells/uL (ref 200–950)
Basophils Absolute: 41 cells/uL (ref 0–200)
Basophils Relative: 0.6 %
Eosinophils Absolute: 110 cells/uL (ref 15–500)
Eosinophils Relative: 1.6 %
HCT: 40.8 % (ref 35.0–45.0)
Hemoglobin: 13.2 g/dL (ref 11.7–15.5)
Lymphs Abs: 1670 cells/uL (ref 850–3900)
MCH: 27.6 pg (ref 27.0–33.0)
MCHC: 32.4 g/dL (ref 32.0–36.0)
MCV: 85.2 fL (ref 80.0–100.0)
MPV: 11.5 fL (ref 7.5–12.5)
Monocytes Relative: 7.1 %
Neutro Abs: 4589 cells/uL (ref 1500–7800)
Neutrophils Relative %: 66.5 %
Platelets: 306 10*3/uL (ref 140–400)
RBC: 4.79 10*6/uL (ref 3.80–5.10)
RDW: 13.1 % (ref 11.0–15.0)
Total Lymphocyte: 24.2 %
WBC: 6.9 10*3/uL (ref 3.8–10.8)

## 2021-03-31 LAB — COMPLETE METABOLIC PANEL WITH GFR
AG Ratio: 1.9 (calc) (ref 1.0–2.5)
ALT: 24 U/L (ref 6–29)
AST: 22 U/L (ref 10–35)
Albumin: 4.5 g/dL (ref 3.6–5.1)
Alkaline phosphatase (APISO): 71 U/L (ref 37–153)
BUN: 16 mg/dL (ref 7–25)
CO2: 29 mmol/L (ref 20–32)
Calcium: 9.7 mg/dL (ref 8.6–10.4)
Chloride: 105 mmol/L (ref 98–110)
Creat: 0.75 mg/dL (ref 0.50–1.05)
Globulin: 2.4 g/dL (calc) (ref 1.9–3.7)
Glucose, Bld: 101 mg/dL — ABNORMAL HIGH (ref 65–99)
Potassium: 4.7 mmol/L (ref 3.5–5.3)
Sodium: 142 mmol/L (ref 135–146)
Total Bilirubin: 0.6 mg/dL (ref 0.2–1.2)
Total Protein: 6.9 g/dL (ref 6.1–8.1)
eGFR: 86 mL/min/{1.73_m2} (ref >=60)

## 2021-03-31 LAB — TSH: TSH: 1.71 mIU/L (ref 0.40–4.50)

## 2021-04-05 ENCOUNTER — Encounter: Payer: Self-pay | Admitting: Family Medicine

## 2021-04-05 ENCOUNTER — Other Ambulatory Visit: Payer: Self-pay | Admitting: *Deleted

## 2021-04-05 MED ORDER — ATORVASTATIN CALCIUM 20 MG PO TABS: 20.0000 mg | ORAL_TABLET | Freq: Every day | ORAL | 1 refills | Status: DC

## 2021-04-10 ENCOUNTER — Encounter: Payer: Self-pay | Admitting: Emergency Medicine

## 2021-04-10 ENCOUNTER — Ambulatory Visit
Admission: EM | Admit: 2021-04-10 | Discharge: 2021-04-10 | Disposition: A | Discharge status: Home / Self Care | Payer: PPO | Attending: Internal Medicine | Admitting: Internal Medicine

## 2021-04-10 ENCOUNTER — Other Ambulatory Visit: Payer: Self-pay

## 2021-04-10 DIAGNOSIS — J019 Acute sinusitis, unspecified: Secondary | ICD-10-CM

## 2021-04-10 DIAGNOSIS — Z1152 Encounter for screening for COVID-19: Secondary | ICD-10-CM

## 2021-04-10 DIAGNOSIS — M549 Dorsalgia, unspecified: Secondary | ICD-10-CM

## 2021-04-10 DIAGNOSIS — B9789 Other viral agents as the cause of diseases classified elsewhere: Secondary | ICD-10-CM

## 2021-04-10 LAB — POCT URINALYSIS DIP (MANUAL ENTRY)
Bilirubin, UA: NEGATIVE
Glucose, UA: NEGATIVE mg/dL
Ketones, POC UA: NEGATIVE mg/dL
Leukocytes, UA: NEGATIVE
Nitrite, UA: NEGATIVE
Protein Ur, POC: NEGATIVE mg/dL
Spec Grav, UA: 1.015 (ref 1.010–1.025)
Urobilinogen, UA: 0.2 E.U./dL
pH, UA: 6 (ref 5.0–8.0)

## 2021-04-10 NOTE — ED Triage Notes (Signed)
Patient c/o bilateral lower back pain x 8 days.   Patient endorses nausea.   Patient endorses a temperature of 100.3 f at it's highest at home.   Patient denies dysuria and changes in urine characteristics.   Patient has taken tylenol w/ some relief of symptoms.   Patient c/o nasal congestion x 1 day.   Patient denies any recent COVID testing.   Patient endorses a scratchy throat and headache.   Patient has taken Mucinex w/ some relief of symptoms.

## 2021-04-10 NOTE — Discharge Instructions (Addendum)
Maintain adequate hydration Use your Flonase, saline nasal spray and Mucinex Please quarantine until COVID-19 test results available We will call you with recommendations if labs are abnormal.

## 2021-04-11 LAB — NOVEL CORONAVIRUS, NAA: SARS-CoV-2, NAA: DETECTED — AB

## 2021-04-11 LAB — SARS-COV-2, NAA 2 DAY TAT

## 2021-04-11 NOTE — ED Provider Notes (Signed)
[facility]    CSN: [id number] Arrival date & time: 04/10/21  1553      History   Chief Complaint Chief Complaint  Patient presents with   Back Pain   Nasal Congestion    HPI Tara Brennan is a 70 y.o. female comes to the urgent care with bilateral lower back pain of 3 days duration.  Patient symptoms started insidiously and has been progressively worse.  She also complains of nasal congestion of 1 day duration.  Patient has a low-grade fever of 100.3 Fahrenheit.  Nasal congestion is associated with a scratchy throat and a headache as well as nausea.  He denies vomiting.  Patient is vaccinated against COVID-19 virus.  No abdominal pain or distention.  No sick contacts.Marland Kitchen   HPI  Past Medical History:  Diagnosis Date   Anxiety    GERD (gastroesophageal reflux disease)    Osteopenia    Ovarian cyst    Paroxysmal atrial fibrillation [facility])    Diagnosed November 2020    Patient Active Problem List   Diagnosis Date Noted   Diverticulitis 11/30/2020   Abnormal findings on diagnostic imaging of other parts of digestive tract 11/30/2020   Chronic ischemic heart disease 11/30/2020   Dysphagia 11/30/2020   Esophageal reflux 11/30/2020   Atrial fibrillation with RVR ([place]) 06/23/2019   Pneumothorax, right 11/30/2017   Pain in toe 04/04/2013   Patellofemoral arthritis 12/07/2011   OA (osteoarthritis) of knee 12/07/2011   Knee pain 12/07/2011   KNEE, ARTHRITIS, DEGEN./OSTEO 09/14/2009   KNEE PAIN 09/14/2009    Past Surgical History:  Procedure Laterality Date   ABDOMINAL HYSTERECTOMY  1984   ESOPHAGOGASTRODUODENOSCOPY  06/28/2012   Procedure: ESOPHAGOGASTRODUODENOSCOPY (EGD);  Surgeon: Jeryl Columbia, MD;  Location: [facility] ENDOSCOPY;  Service: Endoscopy;  Laterality: N/A;   OVARIAN CYST SURGERY     PARTIAL HYSTERECTOMY      OB History     Gravida  3   Para  2   Term  0   Preterm  2   AB  1   Living         SAB  1   IAB  0   Ectopic  0   Multiple       Live Births               Home Medications    Prior to Admission medications   Medication Sig Start Date End Date Taking? Authorizing Provider  alendronate (FOSAMAX) 70 MG tablet TAKE 1 TABLET BY MOUTH EVERY SEVEN DAYS 09/29/20  Yes Susy Frizzle, MD  diltiazem (CARDIZEM CD) 180 MG 24 hr capsule TAKE 1 CAPSULE(180 MG) BY MOUTH DAILY 07/13/20  Yes Strader, Fransisco Hertz, PA-C  ELIQUIS 5 MG TABS tablet TAKE 1 TABLET(5 MG) BY MOUTH TWICE DAILY 07/17/20  Yes Strader, [place] M, PA-C  pantoprazole (PROTONIX) 40 MG tablet Take 40 mg by mouth daily. 05/14/20  Yes [provider]  acetaminophen (TYLENOL) 500 MG tablet Take 500 mg by mouth every 6 (six) hours as needed.    [provider]  cholecalciferol (VITAMIN D) 1000 units tablet Take 2,000 Units by mouth daily.     [provider]  Omega-3 Fatty Acids (FISH OIL) 1000 MG CAPS Take by mouth.    [provider]    Family History Family History  Problem Relation Age of Onset   Hypertension Mother    Cancer Father    Diabetes Sister    Arthritis Other  Social History Social History   Tobacco Use   Smoking status: Never   Smokeless tobacco: Never  Vaping Use   Vaping Use: Never used  Substance Use Topics   Alcohol use: No   Drug use: No     Allergies   Patient has no known allergies.   Review of Systems Review of Systems  HENT:  Positive for congestion.   Respiratory: Negative.    Cardiovascular: Negative.   Gastrointestinal: Negative.   Musculoskeletal: Negative.   Neurological:  Positive for headaches.    Physical Exam Triage Vital Signs ED Triage Vitals  Enc Vitals Group     BP 04/10/21 1605 (!) 150/79     Pulse Rate 04/10/21 1605 97     Resp 04/10/21 1605 16     Temp 04/10/21 1605 (!) 100.4 F (38 C)     Temp Source 04/10/21 1605 Oral     SpO2 04/10/21 1605 93 %     Weight --      Height --      Head Circumference --      Peak Flow --      Pain Score  04/10/21 1603 5     Pain Loc --      Pain Edu? --      Excl. in [place]? --    No data found.  Updated Vital Signs BP (!) 150/79 (BP Location: Right Arm)   Pulse 97   Temp (!) 100.4 F (38 C) (Oral)   Resp 16   SpO2 93%   Visual Acuity Right Eye Distance:   Left Eye Distance:   Bilateral Distance:    Right Eye Near:   Left Eye Near:    Bilateral Near:     Physical Exam Vitals and nursing note reviewed.  Constitutional:      General: She is not in acute distress.    Appearance: She is not ill-appearing.  HENT:     Right Ear: Tympanic membrane normal.     Left Ear: Tympanic membrane normal.  Cardiovascular:     Rate and Rhythm: Normal rate and regular rhythm.     Pulses: Normal pulses.     Heart sounds: Normal heart sounds.  Musculoskeletal:        General: Normal range of motion.  Neurological:     Mental Status: She is alert.     UC Treatments / Results  Labs (all labs ordered are listed, but only abnormal results are displayed) Labs Reviewed  POCT URINALYSIS DIP (MANUAL ENTRY) - Abnormal; Notable for the following components:      Result Value   Blood, UA trace-intact (*)    All other components within normal limits  NOVEL CORONAVIRUS, NAA    EKG   Radiology No results found.  Procedures Procedures (including critical care time)  Medications Ordered in UC Medications - No data to display  Initial Impression / Assessment and Plan / UC Course  I have reviewed the triage vital signs and the nursing notes.  Pertinent labs & imaging results that were available during my care of the patient were reviewed by me and considered in my medical decision making (see chart for details).     1.  Acute viral sinusitis: Increase oral fluid intake Continue Flonase, nasal spray and Mucinex use at home We have sent COVID-19 PCR test.  We will call you with recommendations if labs are abnormal Return to urgent care if symptoms worsen. Final Clinical Impressions(s)  / UC Diagnoses  Final diagnoses:  Acute viral sinusitis  Mid back pain     Discharge Instructions      Maintain adequate hydration Use your Flonase, saline nasal spray and Mucinex Please quarantine until COVID-19 test results available We will call you with recommendations if labs are abnormal.   ED Prescriptions   None    PDMP not reviewed this encounter.   Chase Picket, MD 04/11/21 1331

## 2021-04-26 DIAGNOSIS — D225 Melanocytic nevi of trunk: Secondary | ICD-10-CM | POA: Diagnosis not present

## 2021-04-26 DIAGNOSIS — X32XXXD Exposure to sunlight, subsequent encounter: Secondary | ICD-10-CM | POA: Diagnosis not present

## 2021-04-26 DIAGNOSIS — D2262 Melanocytic nevi of left upper limb, including shoulder: Secondary | ICD-10-CM | POA: Diagnosis not present

## 2021-04-26 DIAGNOSIS — S80861A Insect bite (nonvenomous), right lower leg, initial encounter: Secondary | ICD-10-CM | POA: Diagnosis not present

## 2021-04-26 DIAGNOSIS — Z1283 Encounter for screening for malignant neoplasm of skin: Secondary | ICD-10-CM | POA: Diagnosis not present

## 2021-04-26 DIAGNOSIS — D485 Neoplasm of uncertain behavior of skin: Secondary | ICD-10-CM | POA: Diagnosis not present

## 2021-04-26 DIAGNOSIS — L570 Actinic keratosis: Secondary | ICD-10-CM | POA: Diagnosis not present

## 2021-05-14 ENCOUNTER — Telehealth: Payer: Self-pay | Admitting: Cardiology

## 2021-05-14 NOTE — Telephone Encounter (Signed)
Pt to fill out pt assistance forms for Eliquis Monday

## 2021-05-14 NOTE — Telephone Encounter (Signed)
Patient calling the office for samples of medication:   1.  What medication and dosage are you requesting samples for?  ELIQUIS  2.  Are you currently out of this medication? UNCERTAIN    Please call cell #

## 2021-05-17 DIAGNOSIS — D485 Neoplasm of uncertain behavior of skin: Secondary | ICD-10-CM | POA: Diagnosis not present

## 2021-05-17 DIAGNOSIS — L905 Scar conditions and fibrosis of skin: Secondary | ICD-10-CM | POA: Diagnosis not present

## 2021-05-18 ENCOUNTER — Encounter: Payer: Self-pay | Admitting: Family Medicine

## 2021-05-19 ENCOUNTER — Ambulatory Visit (INDEPENDENT_AMBULATORY_CARE_PROVIDER_SITE_OTHER): Payer: PPO | Admitting: *Deleted

## 2021-05-19 ENCOUNTER — Other Ambulatory Visit: Payer: Self-pay

## 2021-05-19 DIAGNOSIS — Z23 Encounter for immunization: Secondary | ICD-10-CM

## 2021-05-19 IMAGING — DX DG FOOT COMPLETE 3+V*L*
3 series · 3 of 3 positions shown · non-contrast
Comparison: None.

CLINICAL DATA: Mid left foot pain for 1 month with no known injury.

EXAM:
LEFT FOOT - COMPLETE 3+ VIEW

[foot ap]
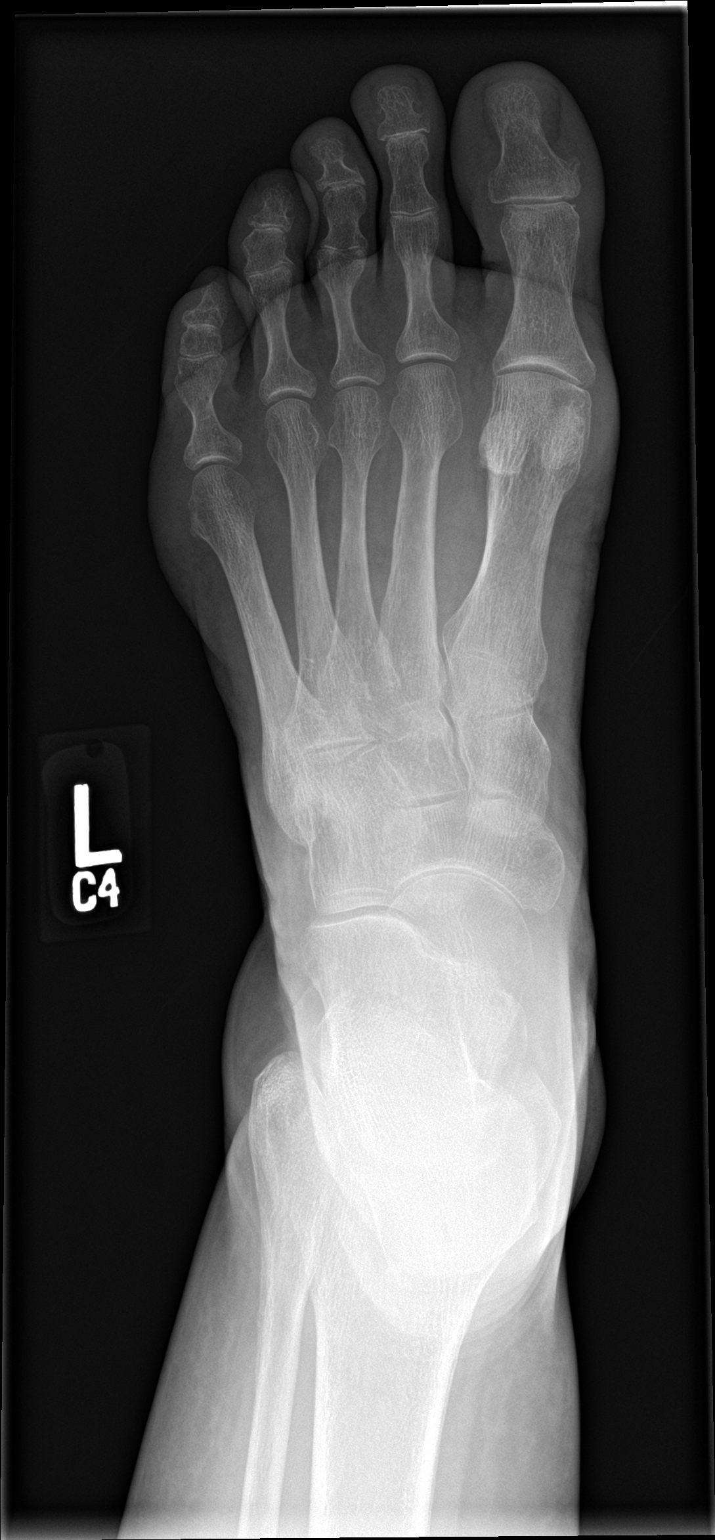

[foot obl]
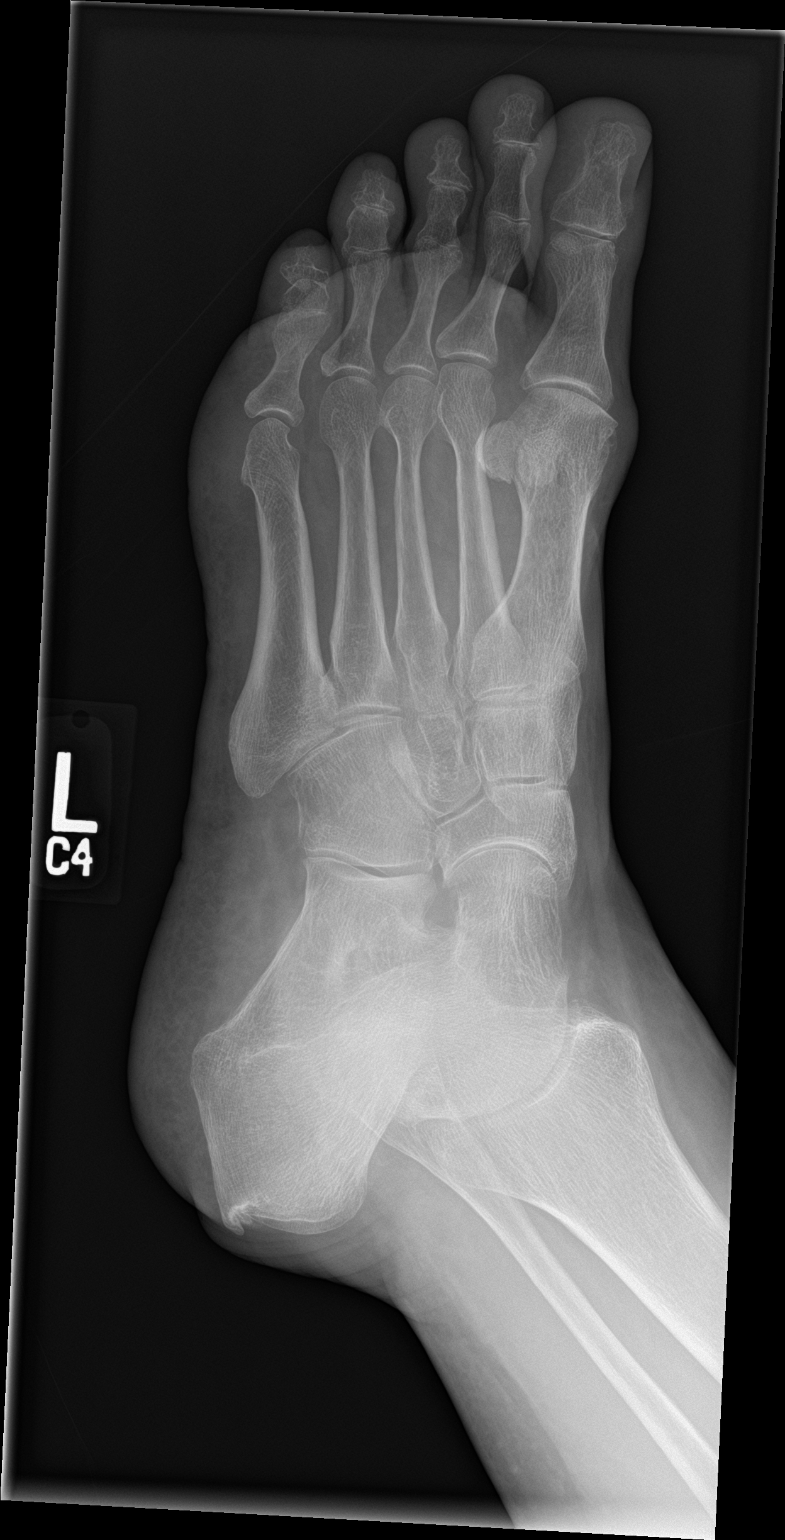

[foot lat]
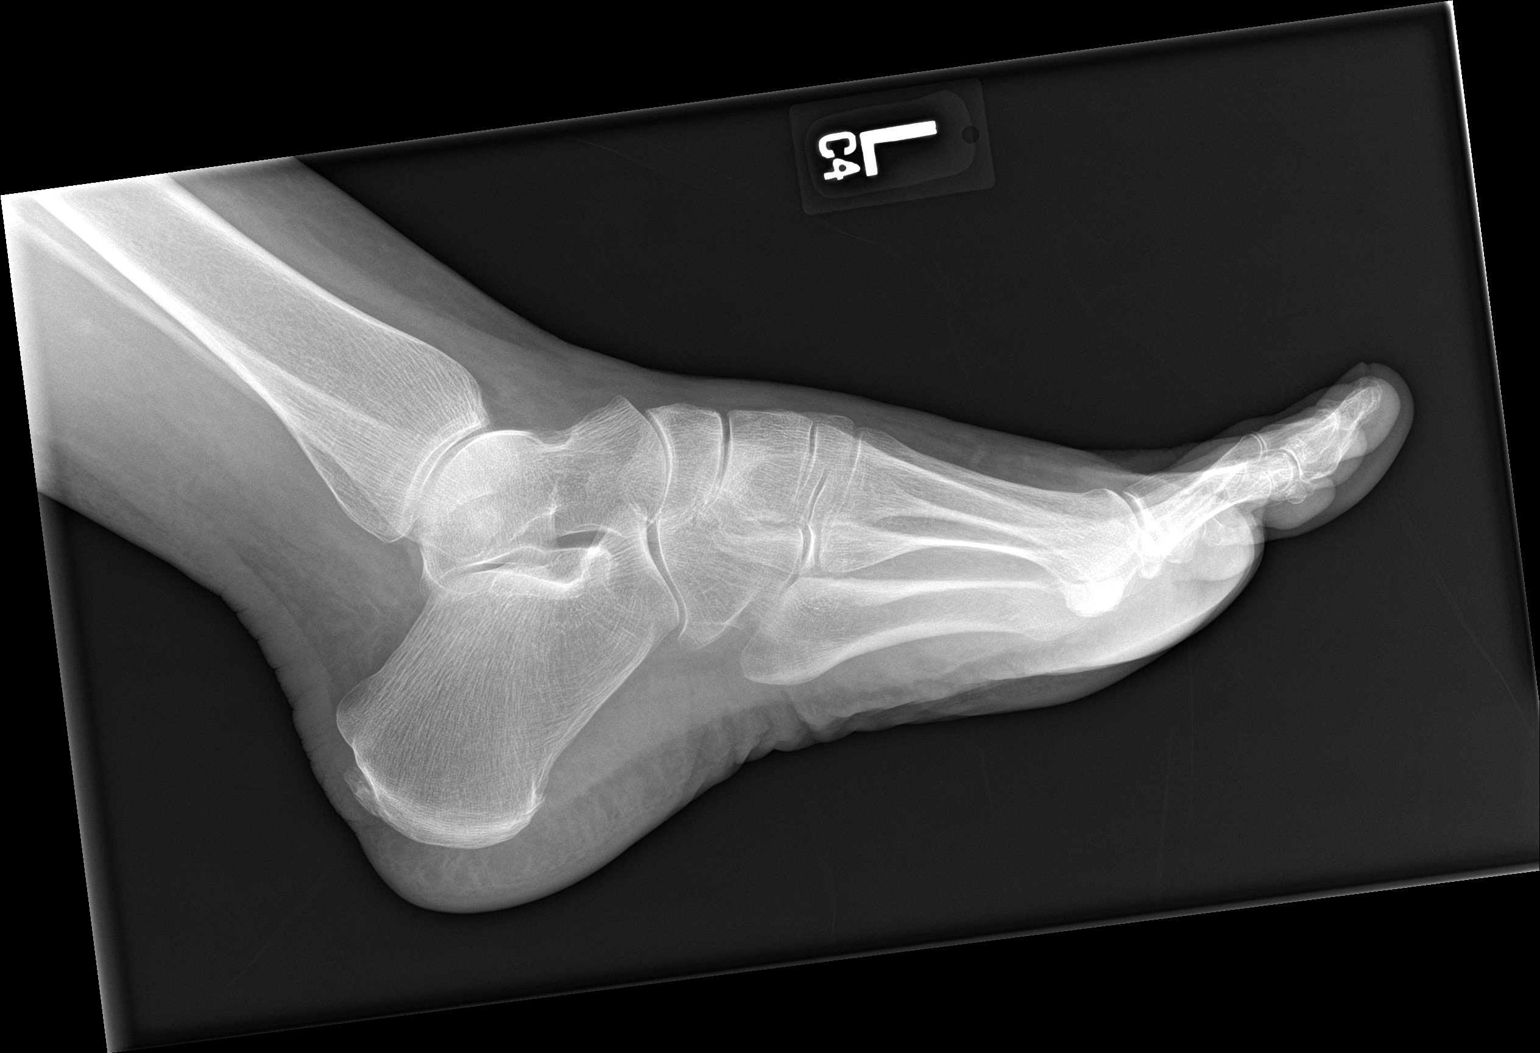

[3 of 3 positions shown; findings below may reference images not displayed]

FINDINGS: No fracture or bone lesion.

Mild narrowing of the first metatarsophalangeal joint consistent
with osteoarthritis. Osteoarthritis also noted involving the
interphalangeal joints of the second through fifth toes. Remaining
joints normally spaced and aligned.

Soft tissues are unremarkable.
IMPRESSION: 1. No fracture or acute finding.
2. Mild osteoarthritis of interphalangeal joints and the first
metatarsophalangeal joint

## 2021-06-09 ENCOUNTER — Telehealth: Payer: Self-pay | Admitting: Family Medicine

## 2021-06-09 NOTE — Telephone Encounter (Signed)
Left message for patient to call back and schedule Medicare Annual Wellness Visit (AWV) in office.  ° °If not able to come in office, please offer to do virtually or by telephone.  Left office number and my jabber #[phone]. ° °Due for AWVI ° °Please schedule at anytime with Nurse Health Advisor. °  °

## 2021-06-18 ENCOUNTER — Other Ambulatory Visit: Payer: Self-pay

## 2021-06-18 ENCOUNTER — Ambulatory Visit (INDEPENDENT_AMBULATORY_CARE_PROVIDER_SITE_OTHER): Payer: PPO

## 2021-06-18 VITALS — Ht 67.5 in | Wt 185.0 lb

## 2021-06-18 DIAGNOSIS — Z780 Asymptomatic menopausal state: Secondary | ICD-10-CM | POA: Diagnosis not present

## 2021-06-18 DIAGNOSIS — Z0000 Encounter for general adult medical examination without abnormal findings: Secondary | ICD-10-CM

## 2021-06-18 NOTE — Patient Instructions (Signed)
Tara Brennan , Thank you for taking time to come for your Medicare Wellness Visit. I appreciate your ongoing commitment to your health goals. Please review the following plan we discussed and let me know if I can assist you in the future.   Screening recommendations/referrals: Colonoscopy: Done 12/07/2018 Repeat in 10 years  Mammogram: Done 10/21/2020 Repeat annually  Bone Density: Done 10/04/2018. Order placed today to schedule for 09/2021.  Recommended yearly ophthalmology/optometry visit for glaucoma screening and checkup Recommended yearly dental visit for hygiene and checkup  Vaccinations: Influenza vaccine: Done 05/19/2021 Repeat annually   Pneumococcal vaccine: Done 10/04/2019 Tdap vaccine: Due. Repeat in 10 years  Shingles vaccine: Shingrix discussed. Please contact your pharmacy for coverage information.     Covid-19:Done 04/16/2020 and 05/07/2020  Advanced directives: Please bring a copy of your health care power of attorney and living will to the office to be added to your chart at your convenience.   Conditions/risks identified: Aim for 30 minutes of exercise or brisk walking each day, drink 6-8 glasses of water and eat lots of fruits and vegetables. KEEP UP THE GOOD WORK!!  Next appointment: Follow up in one year for your annual wellness visit 2023.   Preventive Care 69 Years and Older, Female Preventive care refers to lifestyle choices and visits with your health care provider that can promote health and wellness. What does preventive care include? A yearly physical exam. This is also called an annual well check. Dental exams once or twice a year. Routine eye exams. Ask your health care provider how often you should have your eyes checked. Personal lifestyle choices, including: Daily care of your teeth and gums. Regular physical activity. Eating a healthy diet. Avoiding tobacco and drug use. Limiting alcohol use. Practicing safe sex. Taking low-dose aspirin every  day. Taking vitamin and mineral supplements as recommended by your health care provider. What happens during an annual well check? The services and screenings done by your health care provider during your annual well check will depend on your age, overall health, lifestyle risk factors, and family history of disease. Counseling  Your health care provider may ask you questions about your: Alcohol use. Tobacco use. Drug use. Emotional well-being. Home and relationship well-being. Sexual activity. Eating habits. History of falls. Memory and ability to understand (cognition). Work and work Statistician. Reproductive health. Screening  You may have the following tests or measurements: Height, weight, and BMI. Blood pressure. Lipid and cholesterol levels. These may be checked every 5 years, or more frequently if you are over 16 years old. Skin check. Lung cancer screening. You may have this screening every year starting at age 12 if you have a 30-pack-year history of smoking and currently smoke or have quit within the past 15 years. Fecal occult blood test (FOBT) of the stool. You may have this test every year starting at age 44. Flexible sigmoidoscopy or colonoscopy. You may have a sigmoidoscopy every 5 years or a colonoscopy every 10 years starting at age 58. Hepatitis C blood test. Hepatitis B blood test. Sexually transmitted disease (STD) testing. Diabetes screening. This is done by checking your blood sugar (glucose) after you have not eaten for a while (fasting). You may have this done every 1-3 years. Bone density scan. This is done to screen for osteoporosis. You may have this done starting at age 69. Mammogram. This may be done every 1-2 years. Talk to your health care provider about how often you should have regular mammograms. Talk with your health  care provider about your test results, treatment options, and if necessary, the need for more tests. Vaccines  Your health care  provider may recommend certain vaccines, such as: Influenza vaccine. This is recommended every year. Tetanus, diphtheria, and acellular pertussis (Tdap, Td) vaccine. You may need a Td booster every 10 years. Zoster vaccine. You may need this after age 34. Pneumococcal 13-valent conjugate (PCV13) vaccine. One dose is recommended after age 53. Pneumococcal polysaccharide (PPSV23) vaccine. One dose is recommended after age 14. Talk to your health care provider about which screenings and vaccines you need and how often you need them. This information is not intended to replace advice given to you by your health care provider. Make sure you discuss any questions you have with your health care provider. Document Released: 08/21/2015 Document Revised: 04/13/2016 Document Reviewed: 05/26/2015 Elsevier Interactive Patient Education  2017 [place] Prevention in the Home Falls can cause injuries. They can happen to people of all ages. There are many things you can do to make your home safe and to help prevent falls. What can I do on the outside of my home? Regularly fix the edges of walkways and driveways and fix any cracks. Remove anything that might make you trip as you walk through a door, such as a raised step or threshold. Trim any bushes or trees on the path to your home. Use bright outdoor lighting. Clear any walking paths of anything that might make someone trip, such as rocks or tools. Regularly check to see if handrails are loose or broken. Make sure that both sides of any steps have handrails. Any raised decks and porches should have guardrails on the edges. Have any leaves, snow, or ice cleared regularly. Use sand or salt on walking paths during winter. Clean up any spills in your garage right away. This includes oil or grease spills. What can I do in the bathroom? Use night lights. Install grab bars by the toilet and in the tub and shower. Do not use towel bars as grab  bars. Use non-skid mats or decals in the tub or shower. If you need to sit down in the shower, use a plastic, non-slip stool. Keep the floor dry. Clean up any water that spills on the floor as soon as it happens. Remove soap buildup in the tub or shower regularly. Attach bath mats securely with double-sided non-slip rug tape. Do not have throw rugs and other things on the floor that can make you trip. What can I do in the bedroom? Use night lights. Make sure that you have a light by your bed that is easy to reach. Do not use any sheets or blankets that are too big for your bed. They should not hang down onto the floor. Have a firm chair that has side arms. You can use this for support while you get dressed. Do not have throw rugs and other things on the floor that can make you trip. What can I do in the kitchen? Clean up any spills right away. Avoid walking on wet floors. Keep items that you use a lot in easy-to-reach places. If you need to reach something above you, use a strong step stool that has a grab bar. Keep electrical cords out of the way. Do not use floor polish or wax that makes floors slippery. If you must use wax, use non-skid floor wax. Do not have throw rugs and other things on the floor that can make you trip. What can  I do with my stairs? Do not leave any items on the stairs. Make sure that there are handrails on both sides of the stairs and use them. Fix handrails that are broken or loose. Make sure that handrails are as long as the stairways. Check any carpeting to make sure that it is firmly attached to the stairs. Fix any carpet that is loose or worn. Avoid having throw rugs at the top or bottom of the stairs. If you do have throw rugs, attach them to the floor with carpet tape. Make sure that you have a light switch at the top of the stairs and the bottom of the stairs. If you do not have them, ask someone to add them for you. What else can I do to help prevent  falls? Wear shoes that: Do not have high heels. Have rubber bottoms. Are comfortable and fit you well. Are closed at the toe. Do not wear sandals. If you use a stepladder: Make sure that it is fully opened. Do not climb a closed stepladder. Make sure that both sides of the stepladder are locked into place. Ask someone to hold it for you, if possible. Clearly mark and make sure that you can see: Any grab bars or handrails. First and last steps. Where the edge of each step is. Use tools that help you move around (mobility aids) if they are needed. These include: Canes. Walkers. Scooters. Crutches. Turn on the lights when you go into a dark area. Replace any light bulbs as soon as they burn out. Set up your furniture so you have a clear path. Avoid moving your furniture around. If any of your floors are uneven, fix them. If there are any pets around you, be aware of where they are. Review your medicines with your doctor. Some medicines can make you feel dizzy. This can increase your chance of falling. Ask your doctor what other things that you can do to help prevent falls. This information is not intended to replace advice given to you by your health care provider. Make sure you discuss any questions you have with your health care provider. Document Released: 05/21/2009 Document Revised: 12/31/2015 Document Reviewed: 08/29/2014 Elsevier Interactive Patient Education  2017 [employer].

## 2021-06-18 NOTE — Progress Notes (Signed)
Subjective:   Tara Brennan is a 70 y.o. female who presents for an Initial Medicare Annual Wellness Visit. Virtual Visit via Telephone Note  I connected with  Tara Brennan on 06/18/21 at  2:45 PM EST by telephone and verified that I am speaking with the correct person using two identifiers.  Location: Patient: Home Provider: [facility] Persons participating in the virtual visit: patient/Nurse Health Advisor   I discussed the limitations, risks, security and privacy concerns of performing an evaluation and management service by telephone and the availability of in person appointments. The patient expressed understanding and agreed to proceed.  Interactive audio and video telecommunications were attempted between this nurse and patient, however failed, due to patient having technical difficulties OR patient did not have access to video capability.  We continued and completed visit with audio only.  Some vital signs may be absent or patient reported.   Tara Brennan Driver, LPN  Review of Systems     Cardiac Risk Factors include: advanced age (>63men, >52 women);Other (see comment), Risk factor comments: Atrial Fibrillation     Objective:    Today's Vitals   06/18/21 1440  Weight: 185 lb (83.9 kg)  Height: 5' 7.5" (1.715 m)   Body mass index is 28.55 kg/m.  Advanced Directives 06/18/2021 06/23/2019 11/30/2017 09/04/2017 06/28/2012  Does Patient Have a Medical Advance Directive? Yes No No No Patient does not have advance directive  Type of Advance Directive [place] in Chart? No - copy requested - - - -  Would patient like information on creating a medical advance directive? - No - Patient declined - No - Patient declined -  Pre-existing out of facility DNR order (yellow form or pink MOST form) - - - - No    Current Medications (verified) Outpatient Encounter Medications as of 06/18/2021  Medication Sig    acetaminophen (TYLENOL) 500 MG tablet Take 500 mg by mouth every 6 (six) hours as needed.   alendronate (FOSAMAX) 70 MG tablet TAKE 1 TABLET BY MOUTH EVERY SEVEN DAYS   augmented betamethasone dipropionate (DIPROLENE-AF) 0.05 % cream Apply topically.   calcium carbonate (TUMS - DOSED IN MG ELEMENTAL CALCIUM) 500 MG chewable tablet Chew 1 tablet by mouth daily.   cholecalciferol (VITAMIN D) 1000 units tablet Take 2,000 Units by mouth daily.    diltiazem (CARDIZEM CD) 180 MG 24 hr capsule TAKE 1 CAPSULE(180 MG) BY MOUTH DAILY   ELIQUIS 5 MG TABS tablet TAKE 1 TABLET(5 MG) BY MOUTH TWICE DAILY   Multiple Vitamins-Minerals (ONE-A-DAY WOMENS 50 PLUS) TABS    vitamin C (ASCORBIC ACID) 500 MG tablet    Vitamin E 400 units TABS    [DISCONTINUED] Omega-3 Fatty Acids (FISH OIL) 1000 MG CAPS Take by mouth.   [DISCONTINUED] pantoprazole (PROTONIX) 40 MG tablet Take 40 mg by mouth daily.   No facility-administered encounter medications on file as of 06/18/2021.    Allergies (verified) Patient has no known allergies.   History: Past Medical History:  Diagnosis Date   Anxiety    GERD (gastroesophageal reflux disease)    Osteopenia    Ovarian cyst    Paroxysmal atrial fibrillation [facility])    Diagnosed November 2020   Past Surgical History:  Procedure Laterality Date   ABDOMINAL HYSTERECTOMY  1984   ESOPHAGOGASTRODUODENOSCOPY  06/28/2012   Procedure: ESOPHAGOGASTRODUODENOSCOPY (EGD);  Surgeon: Jeryl Columbia, MD;  Location: [facility] ENDOSCOPY;  Service: Endoscopy;  Laterality: N/A;   OVARIAN CYST SURGERY     PARTIAL HYSTERECTOMY     Family History  Problem Relation Age of Onset   Hypertension Mother    Cancer Father    Diabetes Sister    Arthritis Other    Social History   Socioeconomic History   Marital status: Married    Spouse name: [name]   Number of children: 2   Years of education: Not on file   Highest education level: Not on file  Occupational History   Not on file  Tobacco Use    Smoking status: Never   Smokeless tobacco: Never  Vaping Use   Vaping Use: Never used  Substance and Sexual Activity   Alcohol use: No   Drug use: No   Sexual activity: Yes    Birth control/protection: Surgical  Other Topics Concern   Not on file  Social History Narrative   ** Merged History Encounter **    2 sons   2 grand children    Social Determinants of Health   Financial Resource Strain: Low Risk    Difficulty of Paying Living Expenses: Not hard at all  Food Insecurity: No Food Insecurity   Worried About Charity fundraiser in the Last Year: Never true   [place] in the Last Year: Never true  Transportation Needs: No Transportation Needs   Lack of Transportation (Medical): No   Lack of Transportation (Non-Medical): No  Physical Activity: Sufficiently Active   Days of Exercise per Week: 5 days   Minutes of Exercise per Session: 30 min  Stress: No Stress Concern Present   Feeling of Stress : Not at all  Social Connections: Socially Integrated   Frequency of Communication with Friends and Family: More than three times a week   Frequency of Social Gatherings with Friends and Family: More than three times a week   Attends Religious Services: More than 4 times per year   Active Member of [employer] or Organizations: Yes   Attends Music therapist: More than 4 times per year   Marital Status: Married    Tobacco Counseling Counseling given: Not Answered   Clinical Intake:  Pre-visit preparation completed: Yes  Pain : No/denies pain     BMI - recorded: 28.55 Nutritional Status: BMI 25 -29 Overweight Nutritional Risks: None Diabetes: No  How often do you need to have someone help you when you read instructions, pamphlets, or other written materials from your doctor or pharmacy?: 1 - Never  Diabetic?no  Interpreter Needed?: No  Information entered by :: MJ Tinsley Everman, LPN   Activities of Daily Living In your present state of health, do you have  any difficulty performing the following activities: 06/18/2021  Hearing? N  Vision? N  Difficulty concentrating or making decisions? N  Walking or climbing stairs? N  Dressing or bathing? N  Doing errands, shopping? N  Preparing Food and eating ? N  Using the Toilet? N  In the past six months, have you accidently leaked urine? N  Do you have problems with loss of bowel control? N  Managing your Medications? N  Managing your Finances? N  Housekeeping or managing your Housekeeping? N  Some recent data might be hidden    Patient Care Team: Susy Frizzle, MD as PCP - General (Family Medicine) Satira Sark, MD as PCP - Cardiology (Cardiology)  Indicate any recent Medical Services you may have received from other than [facility] providers in the  past year (date may be approximate).     Assessment:   This is a routine wellness examination for [place].  Hearing/Vision screen Hearing Screening - Comments:: Some hearing issues at times.  Vision Screening - Comments:: Readers. [facility], Dr. Oswaldo Conroy. 2022.  Dietary issues and exercise activities discussed: Current Exercise Habits: Home exercise routine, Type of exercise: walking, Time (Minutes): 30, Frequency (Times/Week): 5, Weekly Exercise (Minutes/Week): 150, Intensity: Mild, Exercise limited by: cardiac condition(s)   Goals Addressed             This Visit's Progress    DIET - REDUCE CALORIE INTAKE       Weight loss.        Depression Screen PHQ 2/9 Scores 06/18/2021 03/30/2021 09/29/2020 10/04/2019  PHQ - 2 Score 0 0 0 0    Fall Risk Fall Risk  06/18/2021 03/30/2021 09/29/2020 10/04/2019 07/01/2019  Falls in the past year? 0 0 0 0 0  Comment - - - - Emmi Telephone Survey: data to providers prior to load  Number falls in past yr: 0 0 0 - -  Injury with Fall? 0 0 0 - -  Risk for fall due to : No Fall Risks No Fall Risks - - -  Follow up Falls prevention discussed Falls evaluation completed Falls  evaluation completed Falls evaluation completed -    FALL RISK PREVENTION PERTAINING TO THE HOME:  Any stairs in or around the home? Yes  If so, are there any without handrails? No  Home free of loose throw rugs in walkways, pet beds, electrical cords, etc? Yes  Adequate lighting in your home to reduce risk of falls? Yes   ASSISTIVE DEVICES UTILIZED TO PREVENT FALLS:  Life alert? No  Use of a cane, walker or w/c? No  Grab bars in the bathroom? Yes  Shower chair or bench in shower? Yes  Elevated toilet seat or a handicapped toilet? Yes   TIMED UP AND GO:  Was the test performed? No . Phone visit.     Cognitive Function:     6CIT Screen 06/18/2021 06/18/2021  What Year? 0 points 0 points  What month? 0 points 0 points  What time? 0 points 0 points  Count back from 20 0 points 0 points  Months in reverse 0 points 0 points  Repeat phrase 0 points 0 points  Total Score 0 0    Immunizations Immunization History  Administered Date(s) Administered   Fluad Quad(high Dose 65+) 06/09/2020, 05/19/2021   Influenza-Unspecified 05/09/2019   PFIZER(Purple Top)SARS-COV-2 Vaccination 04/16/2020, 05/07/2020   Pneumococcal Conjugate-13 10/04/2019    TDAP status: Due, Education has been provided regarding the importance of this vaccine. Advised may receive this vaccine at local pharmacy or Health Dept. Aware to provide a copy of the vaccination record if obtained from local pharmacy or Health Dept. Verbalized acceptance and understanding.  Flu Vaccine status: Up to date  Pneumococcal vaccine status: Up to date  Covid-19 vaccine status: Information provided on how to obtain vaccines.   Qualifies for Shingles Vaccine? Yes   Zostavax completed No   Shingrix Completed?: No.    Education has been provided regarding the importance of this vaccine. Patient has been advised to call insurance company to determine out of pocket expense if they have not yet received this vaccine. Advised may  also receive vaccine at local pharmacy or Health Dept. Verbalized acceptance and understanding.  Screening Tests Health Maintenance  Topic Date Due   Zoster Vaccines- Shingrix (  1 of 2) Never done   COVID-19 Vaccine (3 - Booster for Pfizer series) 07/02/2020   Pneumonia Vaccine 63+ Years old (2 - PPSV23 if available, else PCV20) 10/03/2020   TETANUS/TDAP  11/30/2021 (Originally 03/08/1970)   MAMMOGRAM  10/22/2022   COLONOSCOPY (Pts 45-37yrs Insurance coverage will need to be confirmed)  12/06/2028   INFLUENZA VACCINE  Completed   DEXA SCAN  Completed   HPV VACCINES  Aged Out   Hepatitis C Screening  Discontinued    Health Maintenance  Health Maintenance Due  Topic Date Due   Zoster Vaccines- Shingrix (1 of 2) Never done   COVID-19 Vaccine (3 - Booster for Pfizer series) 07/02/2020   Pneumonia Vaccine 45+ Years old (2 - PPSV23 if available, else PCV20) 10/03/2020    Colorectal cancer screening: Type of screening: Colonoscopy. Completed 12/07/2018. Repeat every 10 years  Mammogram status: Completed 10/21/2020. Repeat every year  Bone Density status: Ordered 06/18/21. Pt provided with contact info and advised to call to schedule appt.  Lung Cancer Screening: (Low Dose CT Chest recommended if Age 9-80 years, 30 pack-year currently smoking OR have quit w/in 15years.) does not qualify.  NON-SMOKER  Additional Screening:  Hepatitis C Screening: does qualify; Completed Not done.  Vision Screening: Recommended annual ophthalmology exams for early detection of glaucoma and other disorders of the eye. Is the patient up to date with their annual eye exam?  Yes  Who is the provider or what is the name of the office in which the patient attends annual eye exams? [facility] If pt is not established with a provider, would they like to be referred to a provider to establish care? No .   Dental Screening: Recommended annual dental exams for proper oral hygiene  Community Resource  Referral / Chronic Care Management: CRR required this visit?  No   CCM required this visit?  No      Plan:     I have personally reviewed and noted the following in the patient's chart:   Medical and social history Use of alcohol, tobacco or illicit drugs  Current medications and supplements including opioid prescriptions. Patient is not currently taking opioid prescriptions. Functional ability and status Nutritional status Physical activity Advanced directives List of other physicians Hospitalizations, surgeries, and ER visits in previous 12 months Vitals Screenings to include cognitive, depression, and falls Referrals and appointments  In addition, I have reviewed and discussed with patient certain preventive protocols, quality metrics, and best practice recommendations. A written personalized care plan for preventive services as well as general preventive health recommendations were provided to patient.     Tara Brennan Driver, LPN   07/86/7544   Nurse Notes: Up to date on health maintenance. Order placed for Dexa to be scheduled for 09/2021, pt agreeable. Discussed Shingles vaccine. Pt states she had Covid vaccine with Dr. Luan Pulling office in addition to the 10/04/2019 one.

## 2021-07-05 ENCOUNTER — Other Ambulatory Visit: Payer: Self-pay

## 2021-07-05 ENCOUNTER — Encounter: Payer: Self-pay | Admitting: Family Medicine

## 2021-07-05 ENCOUNTER — Other Ambulatory Visit: Payer: Self-pay | Admitting: Student

## 2021-07-05 DIAGNOSIS — I480 Paroxysmal atrial fibrillation: Secondary | ICD-10-CM

## 2021-07-05 MED ORDER — APIXABAN 5 MG PO TABS
5.0000 mg | ORAL_TABLET | Freq: Two times a day (BID) | ORAL | 0 refills | Status: DC
Start: 2021-07-05 — End: 2021-08-10

## 2021-08-09 ENCOUNTER — Other Ambulatory Visit: Payer: Self-pay | Admitting: Student

## 2021-08-09 DIAGNOSIS — I480 Paroxysmal atrial fibrillation: Secondary | ICD-10-CM

## 2021-08-10 NOTE — Telephone Encounter (Signed)
Prescription refill request for Eliquis received. Indication: a fib Last office visit: 02/16/21 Scr: 0.75 Age: 71 Weight: 83kg

## 2021-08-26 NOTE — Progress Notes (Signed)
Cardiology Office Note    Date:  08/27/2021   ID:  [name], [name] 04-03-1951, MRN [id number]  PCP:  Susy Frizzle, MD  Cardiologist: Rozann Lesches, MD    Chief Complaint  Patient presents with   Follow-up    6 month visit    History of Present Illness:    Tara Brennan is a 71 y.o. female with past medical history of paroxysmal atrial fibrillation, HTN and GERD who presents to the office today for 47-month follow-up.   She was last examined by Dr. Domenic Polite in 02/2021 and denied any recent chest pain or palpitations at that time. She was continued on her current cardiac medications including Eliquis and Cardizem CD 180mg  daily.   In talking with the patient today, she reports overall doing well since her last office visit. She has made multiple dietary changes since her recent labs with her PCP last year showed her cholesterol was elevated as she did not want to be on statin therapy. She has also started exercising again more frequently and has been going to [employer] with her sister. She denies any recent exertional chest pain or dyspnea on exertion. Says she sometimes forgets to take a deep breath. Denies any specific palpitations, orthopnea, PND or pitting edema.  She remains on Eliquis for anticoagulation and denies any melena, hematochezia or hematuria.  Past Medical History:  Diagnosis Date   Anxiety    GERD (gastroesophageal reflux disease)    Osteopenia    Ovarian cyst    Paroxysmal atrial fibrillation [facility])    Diagnosed November 2020    Past Surgical History:  Procedure Laterality Date   ABDOMINAL HYSTERECTOMY  1984   ESOPHAGOGASTRODUODENOSCOPY  06/28/2012   Procedure: ESOPHAGOGASTRODUODENOSCOPY (EGD);  Surgeon: Jeryl Columbia, MD;  Location: [facility] ENDOSCOPY;  Service: Endoscopy;  Laterality: N/A;   OVARIAN CYST SURGERY     PARTIAL HYSTERECTOMY      Current Medications: Outpatient Medications Prior to Visit  Medication Sig Dispense Refill    acetaminophen (TYLENOL) 500 MG tablet Take 500 mg by mouth every 6 (six) hours as needed.     alendronate (FOSAMAX) 70 MG tablet TAKE (1) TABLET BY MOUTH ONCE A WEEK. 12 tablet 0   apixaban (ELIQUIS) 5 MG TABS tablet TAKE 1 TABLET(5 MG) BY MOUTH TWICE DAILY 180 tablet 1   calcium carbonate (TUMS - DOSED IN MG ELEMENTAL CALCIUM) 500 MG chewable tablet Chew 1 tablet by mouth daily.     cholecalciferol (VITAMIN D) 1000 units tablet Take 2,000 Units by mouth daily.      diltiazem (CARDIZEM CD) 180 MG 24 hr capsule TAKE 1 CAPSULE(180 MG) BY MOUTH DAILY 90 capsule 3   Multiple Vitamins-Minerals (ONE-A-DAY WOMENS 50 PLUS) TABS      Vitamin E 400 units TABS      No facility-administered medications prior to visit.     Allergies:   Patient has no known allergies.   Social History   Socioeconomic History   Marital status: Married    Spouse name: [name]   Number of children: 2   Years of education: Not on file   Highest education level: Not on file  Occupational History   Not on file  Tobacco Use   Smoking status: Never   Smokeless tobacco: Never  Vaping Use   Vaping Use: Never used  Substance and Sexual Activity   Alcohol use: No   Drug use: No   Sexual activity: Yes    Birth control/protection:  Surgical  Other Topics Concern   Not on file  Social History Narrative   ** Merged History Encounter **    2 sons   2 grand children    Social Determinants of Health   Financial Resource Strain: Low Risk    Difficulty of Paying Living Expenses: Not hard at all  Food Insecurity: No Food Insecurity   Worried About Charity fundraiser in the Last Year: Never true   Arboriculturist in the Last Year: Never true  Transportation Needs: No Transportation Needs   Lack of Transportation (Medical): No   Lack of Transportation (Non-Medical): No  Physical Activity: Sufficiently Active   Days of Exercise per Week: 5 days   Minutes of Exercise per Session: 30 min  Stress: No Stress Concern Present    Feeling of Stress : Not at all  Social Connections: Socially Integrated   Frequency of Communication with Friends and Family: More than three times a week   Frequency of Social Gatherings with Friends and Family: More than three times a week   Attends Religious Services: More than 4 times per year   Active Member of [employer] or Organizations: Yes   Attends Music therapist: More than 4 times per year   Marital Status: Married     Family History:  The patient's family history includes Arthritis in an other family member; Cancer in her father; Diabetes in her sister; Hypertension in her mother.   Review of Systems:    Please see the history of present illness.     All other systems reviewed and are otherwise negative except as noted above.   Physical Exam:    VS:  BP 138/78    Pulse 76    Ht 5' 7.5" (1.715 m)    Wt 189 lb 12.8 oz (86.1 kg)    SpO2 99%    BMI 29.29 kg/m    General: Well developed, well nourished,female appearing in no acute distress. Head: Normocephalic, atraumatic. Neck: No carotid bruits. JVD not elevated.  Lungs: Respirations regular and unlabored, without wheezes or rales.  Heart: Regular rate and rhythm. No S3 or S4.  No murmur, no rubs, or gallops appreciated. Abdomen: Appears non-distended. No obvious abdominal masses. Msk:  Strength and tone appear normal for age. No obvious joint deformities or effusions. Extremities: No clubbing or cyanosis. No pitting edema.  Distal pedal pulses are 2+ bilaterally. Neuro: Alert and oriented X 3. Moves all extremities spontaneously. No focal deficits noted. Psych:  Responds to questions appropriately with a normal affect. Skin: No rashes or lesions noted  Wt Readings from Last 3 Encounters:  08/27/21 189 lb 12.8 oz (86.1 kg)  06/18/21 185 lb (83.9 kg)  03/30/21 191 lb (86.6 kg)     Studies/Labs Reviewed:   EKG:  EKG is not ordered today.    Recent Labs: 03/30/2021: ALT 24; BUN 16; Creat 0.75;  Hemoglobin 13.2; Platelets 306; Potassium 4.7; Sodium 142; TSH 1.71   Lipid Panel    Component Value Date/Time   CHOL 241 (H) 03/30/2021 0955   TRIG 128 03/30/2021 0955   HDL 59 03/30/2021 0955   CHOLHDL 4.1 03/30/2021 0955   LDLCALC 157 (H) 03/30/2021 0955    Additional studies/ records that were reviewed today include:   Echocardiogram: 06/2019 IMPRESSIONS     1. Left ventricular ejection fraction, by visual estimation, is 60 to  65%. The left ventricle has normal function. There is mildly increased  left ventricular hypertrophy.  2. Left ventricular diastolic function could not be evaluated.   3. The left ventricle has no regional wall motion abnormalities.   4. Global right ventricle has normal systolic function.The right  ventricular size is normal. No increase in right ventricular wall  thickness.   5. Left atrial size was normal.   6. Right atrial size was normal.   7. The mitral valve is grossly normal. Mild mitral valve regurgitation.   8. The tricuspid valve is grossly normal. Tricuspid valve regurgitation  is trivial.   9. The aortic valve is tricuspid. Aortic valve regurgitation is not  visualized. No evidence of aortic valve sclerosis or stenosis.  10. The pulmonic valve was grossly normal. Pulmonic valve regurgitation is  not visualized.  11. The inferior vena cava is normal in size with greater than 50%  respiratory variability, suggesting right atrial pressure of 3 mmHg.   Assessment:    1. Paroxysmal atrial fibrillation (HCC)   2. Essential hypertension   3. Hyperlipidemia LDL goal <100      Plan:   In order of problems listed above:  1. Paroxysmal Atrial Fibrillation - She denies any recent palpitations and her heart rate has been well controlled when checked at home. She is in normal sinus rhythm by examination today. Continue Cardizem CD 180 mg daily for rate-control. - She remains on Eliquis 5 mg twice daily for anticoagulation which is the  appropriate dose at this time based off her age, weight and kidney function. Hemoglobin and platelets were within a normal range in 03/2021 and she is scheduled for follow-up labs next month. Will continue current regimen.  2. HTN - Her BP is at 138/78 during today's visit and has been well-controlled when checked in the ambulatory setting. Continue current medication regimen with Cardizem CD 180 mg daily.  3. HLD - FLP in 03/2021 showed total cholesterol 241, HDL 59, triglycerides 128 and LDL 157. She was prescribed Atorvastatin by her PCP but did not wish to be on statin therapy. She has since made dietary changes in the interim and is now doing intermittent fasting along with exercising routinely. She is due for repeat labs next month. If LDL remains above goal despite lifestyle changes and she still does not wish to be on statin therapy, could consider Zetia.   Medication Adjustments/Labs and Tests Ordered: Current medicines are reviewed at length with the patient today.  Concerns regarding medicines are outlined above.  Medication changes, Labs and Tests ordered today are listed in the Patient Instructions below. Patient Instructions  Medication Instructions:  Your physician recommends that you continue on your current medications as directed. Please refer to the Current Medication list given to you today.  *If you need a refill on your cardiac medications before your next appointment, please call your pharmacy*   If you have labs (blood work) drawn today and your tests are completely normal, you will receive your results only by: [place] (if you have MyChart) OR A paper copy in the mail If you have any lab test that is abnormal or we need to change your treatment, we will call you to review the results.   Follow-Up: At [facility], you and your health needs are our priority.  As part of our continuing mission to provide you with exceptional heart care, we have created  designated Provider Care Teams.  These Care Teams include your primary Cardiologist (physician) and Advanced Practice Providers (APPs -  Physician Assistants and Nurse Practitioners) who all work  together to provide you with the care you need, when you need it.  We recommend signing up for the patient portal called "MyChart".  Sign up information is provided on this After Visit Summary.  MyChart is used to connect with patients for Virtual Visits (Telemedicine).  Patients are able to view lab/test results, encounter notes, upcoming appointments, etc.  Non-urgent messages can be sent to your provider as well.   To learn more about what you can do with MyChart, go to [email or web address].    Your next appointment:   1 year(s)  The format for your next appointment:   In Person  Provider:   Rozann Lesches, MD    Other Instructions Thank you for choosing [facility]!      Signed, Erma Heritage, PA-C  08/27/2021 2:38 PM    [facility] [address] [place], [place] [address] Phone: [phone] Fax: [phone]

## 2021-08-27 ENCOUNTER — Ambulatory Visit: Payer: PPO | Admitting: Student

## 2021-08-27 ENCOUNTER — Other Ambulatory Visit: Payer: Self-pay | Admitting: Family Medicine

## 2021-08-27 ENCOUNTER — Other Ambulatory Visit: Payer: Self-pay

## 2021-08-27 ENCOUNTER — Encounter: Payer: Self-pay | Admitting: Student

## 2021-08-27 VITALS — BP 138/78 | HR 76 | Ht 67.5 in | Wt 189.8 lb

## 2021-08-27 DIAGNOSIS — I480 Paroxysmal atrial fibrillation: Secondary | ICD-10-CM

## 2021-08-27 DIAGNOSIS — E785 Hyperlipidemia, unspecified: Secondary | ICD-10-CM

## 2021-08-27 DIAGNOSIS — I10 Essential (primary) hypertension: Secondary | ICD-10-CM

## 2021-08-27 NOTE — Patient Instructions (Signed)
Medication Instructions:  Your physician recommends that you continue on your current medications as directed. Please refer to the Current Medication list given to you today.  *If you need a refill on your cardiac medications before your next appointment, please call your pharmacy*   If you have labs (blood work) drawn today and your tests are completely normal, you will receive your results only by: [place] (if you have MyChart) OR A paper copy in the mail If you have any lab test that is abnormal or we need to change your treatment, we will call you to review the results.   Follow-Up: At [facility], you and your health needs are our priority.  As part of our continuing mission to provide you with exceptional heart care, we have created designated Provider Care Teams.  These Care Teams include your primary Cardiologist (physician) and Advanced Practice Providers (APPs -  Physician Assistants and Nurse Practitioners) who all work together to provide you with the care you need, when you need it.  We recommend signing up for the patient portal called "MyChart".  Sign up information is provided on this After Visit Summary.  MyChart is used to connect with patients for Virtual Visits (Telemedicine).  Patients are able to view lab/test results, encounter notes, upcoming appointments, etc.  Non-urgent messages can be sent to your provider as well.   To learn more about what you can do with MyChart, go to [email or web address].    Your next appointment:   1 year(s)  The format for your next appointment:   In Person  Provider:   Rozann Lesches, MD    Other Instructions Thank you for choosing [facility]!

## 2021-09-01 IMAGING — MG DIGITAL SCREENING BILAT W/ TOMO W/ CAD
8 series · 8 of 24 positions shown · non-contrast
Comparison: Previous exam(s).

CLINICAL DATA: Screening.

EXAM:
DIGITAL SCREENING BILATERAL MAMMOGRAM WITH TOMO AND CAD

[L MLO synth-2D]
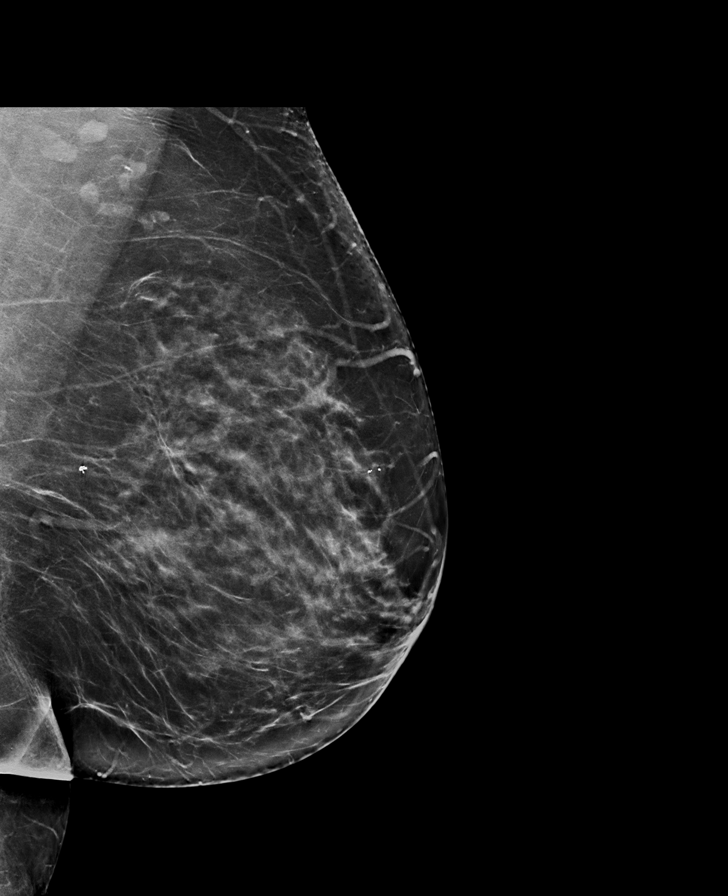

[L CC synth-2D]
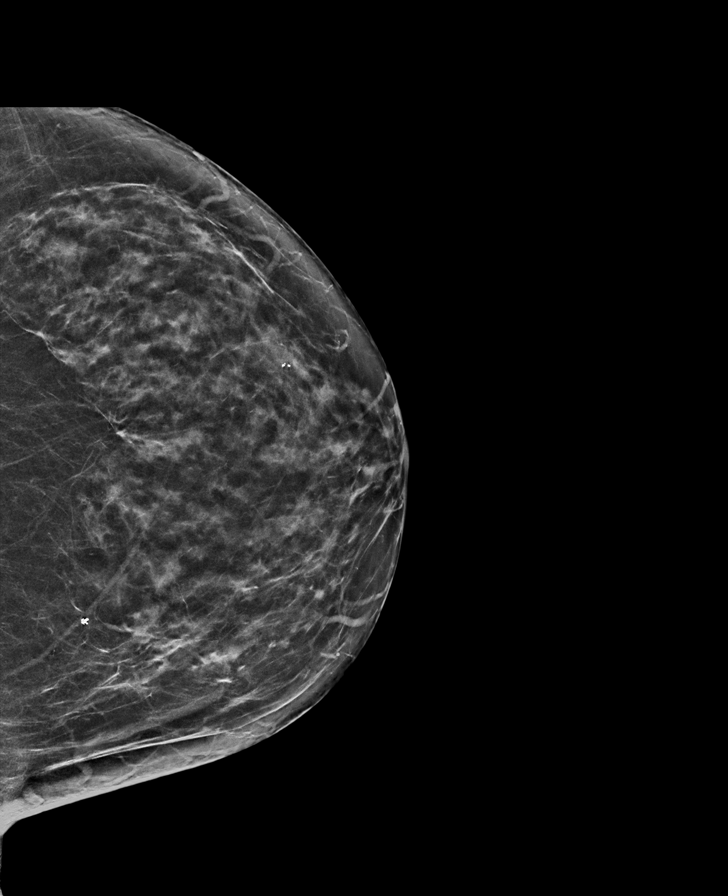

[R CC synth-2D]
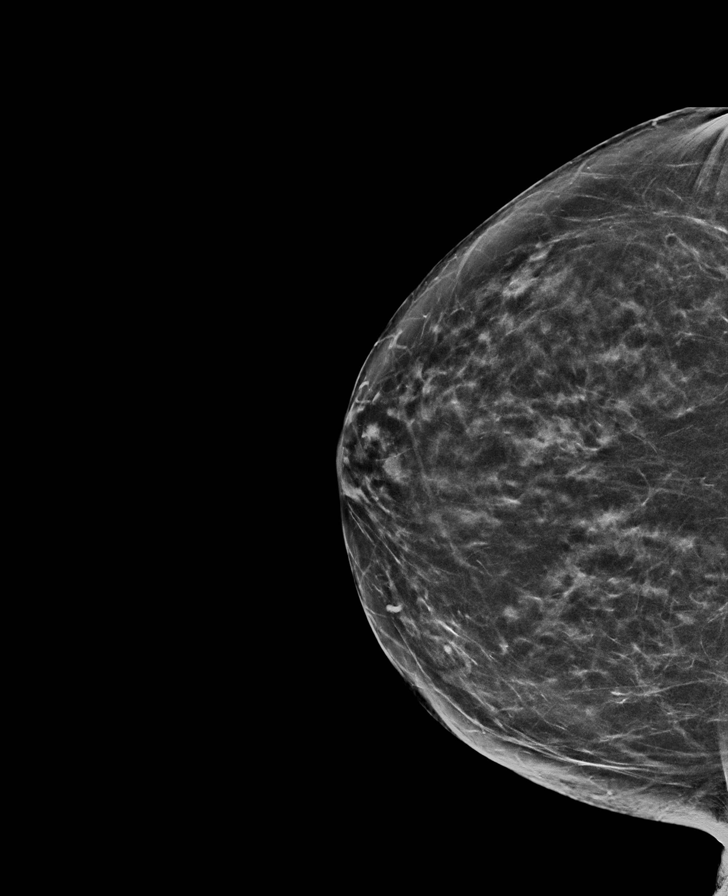

[R MLO synth-2D]
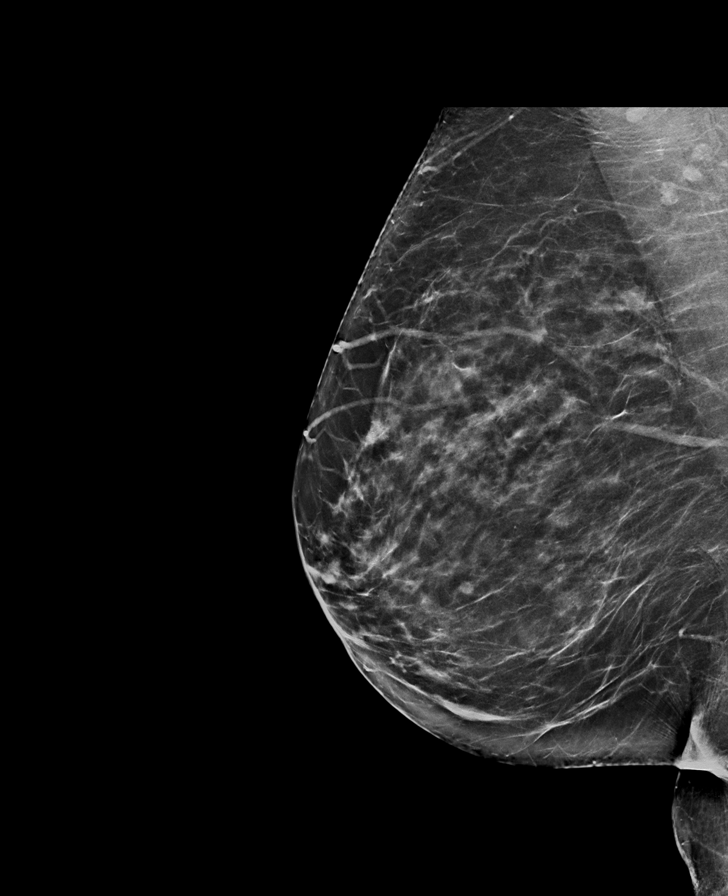

[R MLO tomo · tomo slice 38/75.0]
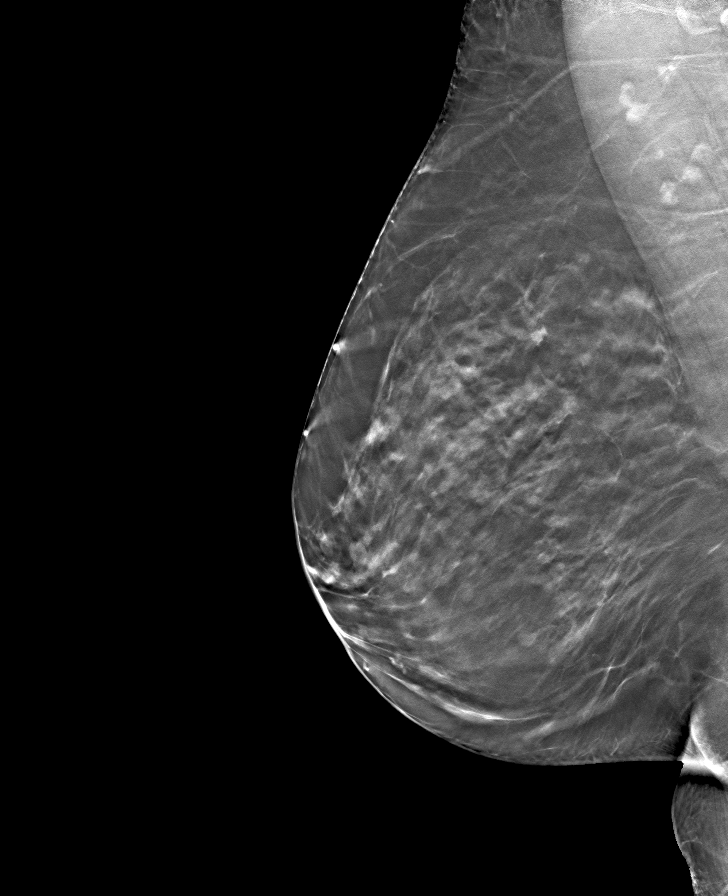

[L CC tomo · tomo slice 36/71.0]
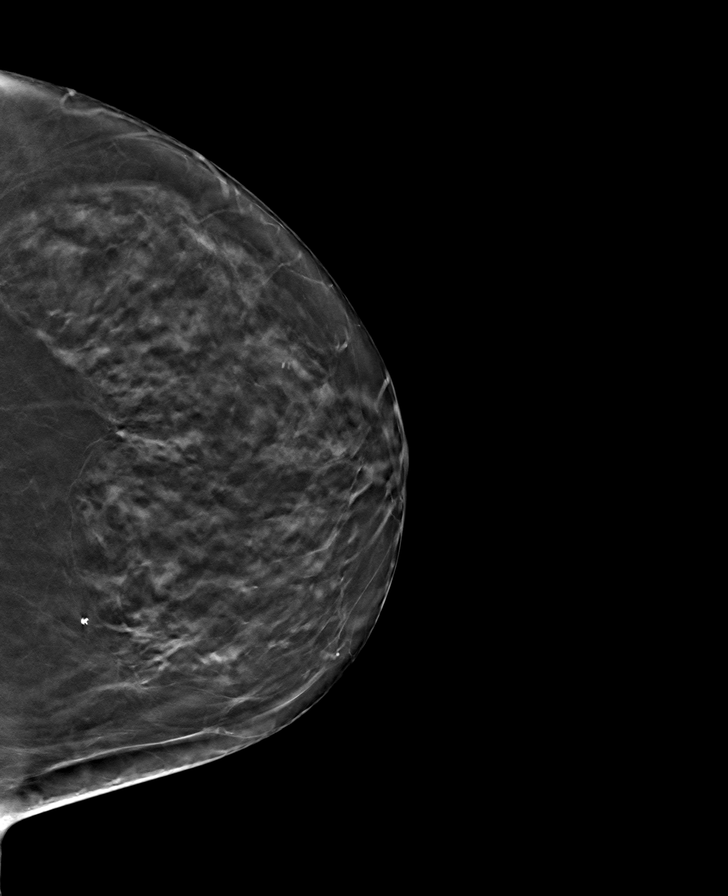

[L MLO tomo · tomo slice 41/82.0]
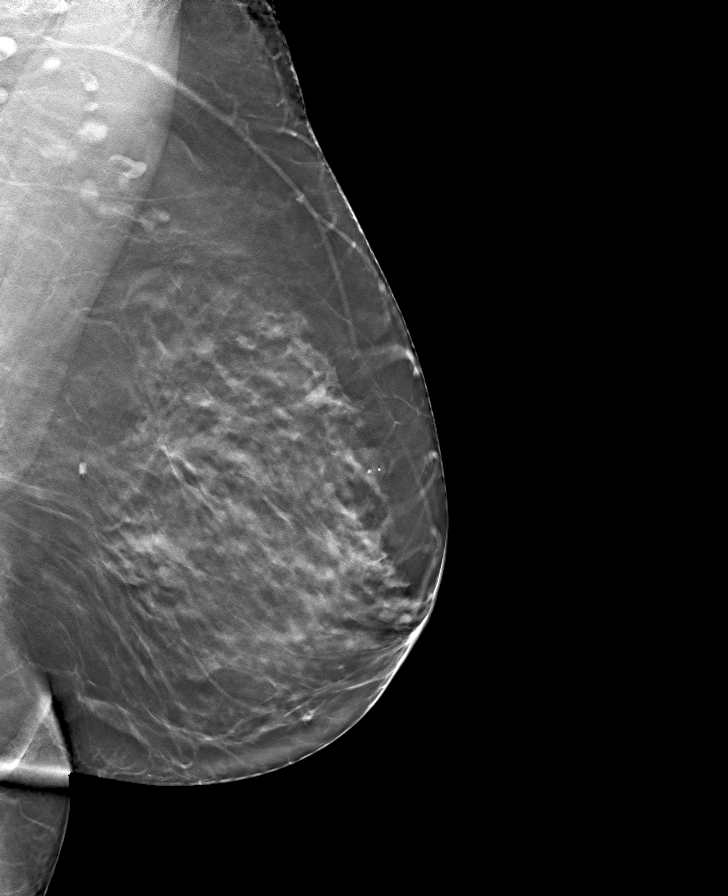

[R CC tomo · tomo slice 36/71.0]
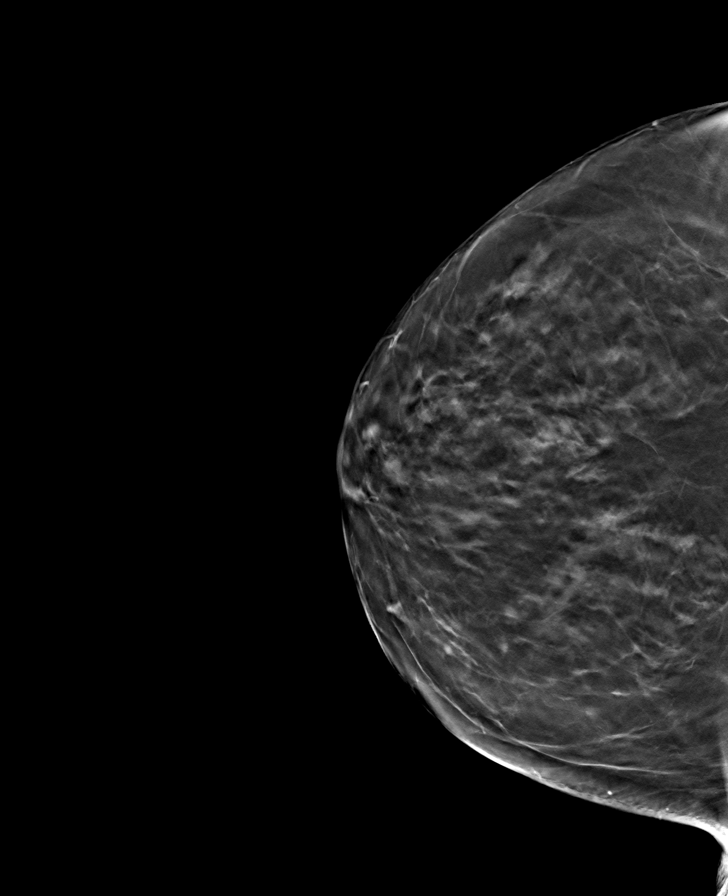

[8 of 24 positions shown; findings below may reference images not displayed]

ACR Breast Density Category c: The breast tissue is heterogeneously
dense, which may obscure small masses.
FINDINGS: There are no findings suspicious for malignancy. Images were
processed with CAD.
IMPRESSION: No mammographic evidence of malignancy. A result letter of this
screening mammogram will be mailed directly to the patient.

RECOMMENDATION:
Screening mammogram in one year. (Code:FT-U-LHB)

BI-RADS CATEGORY  1: Negative.

## 2021-09-24 ENCOUNTER — Ambulatory Visit (HOSPITAL_COMMUNITY)
Admission: RE | Admit: 2021-09-24 | Discharge: 2021-09-24 | Disposition: A | Discharge status: Home / Self Care | Payer: PPO | Source: Ambulatory Visit | Attending: Family Medicine | Admitting: Family Medicine

## 2021-09-24 ENCOUNTER — Other Ambulatory Visit: Payer: Self-pay

## 2021-09-24 DIAGNOSIS — M810 Age-related osteoporosis without current pathological fracture: Secondary | ICD-10-CM | POA: Diagnosis not present

## 2021-09-24 DIAGNOSIS — Z780 Asymptomatic menopausal state: Secondary | ICD-10-CM | POA: Diagnosis not present

## 2021-09-24 DIAGNOSIS — M85839 Other specified disorders of bone density and structure, unspecified forearm: Secondary | ICD-10-CM | POA: Diagnosis not present

## 2021-10-01 ENCOUNTER — Other Ambulatory Visit: Payer: Self-pay

## 2021-10-01 ENCOUNTER — Encounter: Payer: Self-pay | Admitting: Family Medicine

## 2021-10-01 ENCOUNTER — Ambulatory Visit (INDEPENDENT_AMBULATORY_CARE_PROVIDER_SITE_OTHER): Payer: PPO | Admitting: Family Medicine

## 2021-10-01 VITALS — BP 124/92 | HR 73 | Temp 97.2°F | Resp 18 | Ht 67.5 in | Wt 189.0 lb

## 2021-10-01 DIAGNOSIS — I480 Paroxysmal atrial fibrillation: Secondary | ICD-10-CM | POA: Diagnosis not present

## 2021-10-01 DIAGNOSIS — E7800 Pure hypercholesterolemia, unspecified: Secondary | ICD-10-CM | POA: Diagnosis not present

## 2021-10-01 LAB — COMPLETE METABOLIC PANEL WITH GFR
AG Ratio: 1.8 (calc) (ref 1.0–2.5)
ALT: 18 U/L (ref 6–29)
AST: 20 U/L (ref 10–35)
Albumin: 4.4 g/dL (ref 3.6–5.1)
Alkaline phosphatase (APISO): 81 U/L (ref 37–153)
BUN: 19 mg/dL (ref 7–25)
CO2: 31 mmol/L (ref 20–32)
Calcium: 10.4 mg/dL (ref 8.6–10.4)
Chloride: 103 mmol/L (ref 98–110)
Creat: 0.76 mg/dL (ref 0.50–1.05)
Globulin: 2.5 g/dL (calc) (ref 1.9–3.7)
Glucose, Bld: 93 mg/dL (ref 65–99)
Potassium: 4.9 mmol/L (ref 3.5–5.3)
Sodium: 140 mmol/L (ref 135–146)
Total Bilirubin: 0.7 mg/dL (ref 0.2–1.2)
Total Protein: 6.9 g/dL (ref 6.1–8.1)
eGFR: 85 mL/min/{1.73_m2} (ref >=60)

## 2021-10-01 LAB — LIPID PANEL
Cholesterol: 207 mg/dL — ABNORMAL HIGH (ref <200)
HDL: 54 mg/dL (ref >=50)
LDL Cholesterol (Calc): 125 mg/dL (calc) — ABNORMAL HIGH
Non-HDL Cholesterol (Calc): 153 mg/dL (calc) — ABNORMAL HIGH (ref <130)
Total CHOL/HDL Ratio: 3.8 (calc) (ref <5.0)
Triglycerides: 168 mg/dL — ABNORMAL HIGH (ref <150)

## 2021-10-01 NOTE — Progress Notes (Signed)
Subjective:    Patient ID: Tara Brennan, female    DOB: February 11, 1951, 71 y.o.   MRN: [id number]  HPI  Patient is a very pleasant 71 year old Caucasian femalemale who presents today for a checkup.  Nov 2020, she had an episode of atrial fibrillation.  She is uncertain as to what caused it.  She went into rapid ventricular response and went to the emergency room.  After receiving IV Cardizem, she spontaneously converted back to normal sinus rhythm and she remains in normal sinus rhythm today.  I calculated her CHA2DS2-VASc 2 score to be 3.  At the patient's last visit, her LDL cholesterol was found to be significantly elevated approaching 160.  She elected not to take statin.  She is here today to recheck her cholesterol.  She denies any chest pain, shortness of breath, palpitations, dyspnea on exertion.  She is due for her mammogram in March and she will schedule this.  Her bone density test was just recently performed and was completely stable.  Her thyroid biopsy was normal in May and showed benign follicular nodule.  Today on exam I do not appreciate any growth or enlargement of the thyroid gland. Past Medical History:  Diagnosis Date   Anxiety    GERD (gastroesophageal reflux disease)    Osteopenia    Ovarian cyst    Paroxysmal atrial fibrillation [facility])    Diagnosed November 2020     Past Surgical History:  Procedure Laterality Date   ABDOMINAL HYSTERECTOMY  1984   ESOPHAGOGASTRODUODENOSCOPY  06/28/2012   Procedure: ESOPHAGOGASTRODUODENOSCOPY (EGD);  Surgeon: Jeryl Columbia, MD;  Location: [facility] ENDOSCOPY;  Service: Endoscopy;  Laterality: N/A;   OVARIAN CYST SURGERY     PARTIAL HYSTERECTOMY     Current Outpatient Medications on File Prior to Visit  Medication Sig Dispense Refill   acetaminophen (TYLENOL) 500 MG tablet Take 500 mg by mouth every 6 (six) hours as needed.     alendronate (FOSAMAX) 70 MG tablet TAKE (1) TABLET BY MOUTH ONCE A WEEK. 12 tablet 0   apixaban (ELIQUIS) 5 MG TABS  tablet TAKE 1 TABLET(5 MG) BY MOUTH TWICE DAILY 180 tablet 1   calcium carbonate (TUMS - DOSED IN MG ELEMENTAL CALCIUM) 500 MG chewable tablet Chew 1 tablet by mouth daily.     cholecalciferol (VITAMIN D) 1000 units tablet Take 2,000 Units by mouth daily.      diltiazem (CARDIZEM CD) 180 MG 24 hr capsule TAKE 1 CAPSULE(180 MG) BY MOUTH DAILY 90 capsule 3   Multiple Vitamins-Minerals (ONE-A-DAY WOMENS 50 PLUS) TABS      Vitamin E 400 units TABS      No current facility-administered medications on file prior to visit.   No Known Allergies Social History   Socioeconomic History   Marital status: Married    Spouse name: [name]   Number of children: 2   Years of education: Not on file   Highest education level: Not on file  Occupational History   Not on file  Tobacco Use   Smoking status: Never   Smokeless tobacco: Never  Vaping Use   Vaping Use: Never used  Substance and Sexual Activity   Alcohol use: No   Drug use: No   Sexual activity: Yes    Birth control/protection: Surgical  Other Topics Concern   Not on file  Social History Narrative   ** Merged History Encounter **    2 sons   2 grand children    Social Determinants of  Health   Financial Resource Strain: Low Risk    Difficulty of Paying Living Expenses: Not hard at all  Food Insecurity: No Food Insecurity   Worried About [place] in the Last Year: Never true   Ran Out of Food in the Last Year: Never true  Transportation Needs: No Transportation Needs   Lack of Transportation (Medical): No   Lack of Transportation (Non-Medical): No  Physical Activity: Sufficiently Active   Days of Exercise per Week: 5 days   Minutes of Exercise per Session: 30 min  Stress: No Stress Concern Present   Feeling of Stress : Not at all  Social Connections: Socially Integrated   Frequency of Communication with Friends and Family: More than three times a week   Frequency of Social Gatherings with Friends and Family: More  than three times a week   Attends Religious Services: More than 4 times per year   Active Member of [employer] or Organizations: Yes   Attends Music therapist: More than 4 times per year   Marital Status: Married  Human resources officer Violence: Not At Risk   Fear of Current or Ex-Partner: No   Emotionally Abused: No   Physically Abused: No   Sexually Abused: No    Review of Systems     Objective:   Physical Exam Vitals reviewed.  Constitutional:      Appearance: She is normal weight.  Cardiovascular:     Rate and Rhythm: Normal rate and regular rhythm.     Pulses: Normal pulses.     Heart sounds: Normal heart sounds. No murmur heard.   No friction rub. No gallop.  Pulmonary:     Effort: Pulmonary effort is normal. No respiratory distress.     Breath sounds: Normal breath sounds. No wheezing, rhonchi or rales.  Abdominal:     General: Abdomen is flat. Bowel sounds are normal. There is no distension.     Palpations: Abdomen is soft. There is no mass.     Tenderness: There is no abdominal tenderness. There is no guarding.     Hernia: No hernia is present.  Musculoskeletal:     Right lower leg: No edema.     Left lower leg: No edema.  Neurological:     Mental Status: She is alert.          Assessment & Plan:  Paroxysmal atrial fibrillation (HCC)  Pure hypercholesterolemia - Plan: Lipid panel, COMPLETE METABOLIC PANEL WITH GFR We will recheck her cholesterol.  Ideally I like to see her LDL cholesterol near 100 but I would except a below 130.  I encouraged her to try fish oil.  If LDL cholesterol is still well above 130 I will try to encourage her to consider statin.  Patient will schedule her mammogram.  Bone density is stable.  Otherwise she is doing well with no concerns.

## 2021-11-06 ENCOUNTER — Other Ambulatory Visit: Payer: Self-pay | Admitting: Cardiology

## 2021-11-06 DIAGNOSIS — I480 Paroxysmal atrial fibrillation: Secondary | ICD-10-CM

## 2021-11-08 NOTE — Telephone Encounter (Signed)
Prescription refill request for Eliquis received. ?Indication: PAF ?Last office visit: 08/27/21  B Strader PA-C ?Scr: 0.76 on 10/01/21 ?Age: 71 ?Weight: 86.1kg ? ?Based on above findings Eliquis '5mg'$  twice daily is the appropriate dose.  Refill approved. ? ?

## 2021-11-24 ENCOUNTER — Other Ambulatory Visit: Payer: Self-pay | Admitting: Family Medicine

## 2021-12-02 DIAGNOSIS — H903 Sensorineural hearing loss, bilateral: Secondary | ICD-10-CM | POA: Diagnosis not present

## 2021-12-03 ENCOUNTER — Other Ambulatory Visit (HOSPITAL_COMMUNITY): Payer: Self-pay | Admitting: Obstetrics & Gynecology

## 2021-12-03 DIAGNOSIS — Z1231 Encounter for screening mammogram for malignant neoplasm of breast: Secondary | ICD-10-CM

## 2021-12-15 ENCOUNTER — Ambulatory Visit (HOSPITAL_COMMUNITY)
Admission: RE | Admit: 2021-12-15 | Discharge: 2021-12-15 | Disposition: A | Discharge status: Home / Self Care | Payer: PPO | Source: Ambulatory Visit | Attending: Obstetrics & Gynecology | Admitting: Obstetrics & Gynecology

## 2021-12-15 DIAGNOSIS — Z1231 Encounter for screening mammogram for malignant neoplasm of breast: Secondary | ICD-10-CM | POA: Diagnosis not present

## 2021-12-17 DIAGNOSIS — H43812 Vitreous degeneration, left eye: Secondary | ICD-10-CM | POA: Diagnosis not present

## 2021-12-17 DIAGNOSIS — H2513 Age-related nuclear cataract, bilateral: Secondary | ICD-10-CM | POA: Diagnosis not present

## 2021-12-29 DIAGNOSIS — H903 Sensorineural hearing loss, bilateral: Secondary | ICD-10-CM | POA: Diagnosis not present

## 2022-01-26 ENCOUNTER — Ambulatory Visit: Payer: Self-pay

## 2022-01-26 NOTE — Telephone Encounter (Signed)
    Chief Complaint: Right sided abdominal, between hip and breast Symptoms: Pain Frequency: Last night Pertinent Negatives: Patient denies fever Disposition: '[]'$ ED /'[]'$ Urgent Care (no appt availability in office) / '[]'$ Appointment(In office/virtual)/ '[]'$  Portal Virtual Care/ '[]'$ Home Care/ '[]'$ Refused Recommended Disposition /'[]'$ [place] [facility]/ '[x]'$  Follow-up with PCP Additional Notes: Instructed to call back if symptoms worsen or go to ED. Has appointment for Friday per the practice.  Answer Assessment - Initial Assessment Questions 1. LOCATION: "Where does it hurt?"      Right side 2. RADIATION: "Does the pain shoot anywhere else?" (e.g., chest, back)     Back 3. ONSET: "When did the pain begin?" (e.g., minutes, hours or days ago)      Last night 4. SUDDEN: "Gradual or sudden onset?"     Sudden 5. PATTERN "Does the pain come and go, or is it constant?"    - If constant: "Is it getting better, staying the same, or worsening?"      (Note: Constant means the pain never goes away completely; most serious pain is constant and it progresses)     - If intermittent: "How long does it last?" "Do you have pain now?"     (Note: Intermittent means the pain goes away completely between bouts)     Constant 6. SEVERITY: "How bad is the pain?"  (e.g., Scale 1-10; mild, moderate, or severe)   - MILD (1-3): doesn't interfere with normal activities, abdomen soft and not tender to touch    - MODERATE (4-7): interferes with normal activities or awakens from sleep, abdomen tender to touch    - SEVERE (8-10): excruciating pain, doubled over, unable to do any normal activities      Now - 5-6 7. RECURRENT SYMPTOM: "Have you ever had this type of stomach pain before?" If Yes, ask: "When was the last time?" and "What happened that time?"      No 8. CAUSE: "What do you think is causing the stomach pain?"     Unsure 9. RELIEVING/AGGRAVATING FACTORS: "What makes it better or worse?" (e.g., movement, antacids,  bowel movement)     No 10. OTHER SYMPTOMS: "Do you have any other symptoms?" (e.g., back pain, diarrhea, fever, urination pain, vomiting)       No 11. PREGNANCY: "Is there any chance you are pregnant?" "When was your last menstrual period?"       No  Protocols used: Abdominal Pain - [facility]

## 2022-01-28 ENCOUNTER — Ambulatory Visit (INDEPENDENT_AMBULATORY_CARE_PROVIDER_SITE_OTHER): Payer: PPO | Admitting: Family Medicine

## 2022-01-28 VITALS — BP 132/82 | HR 82 | Temp 97.6°F | Ht 67.0 in | Wt 191.0 lb

## 2022-01-28 DIAGNOSIS — R0789 Other chest pain: Secondary | ICD-10-CM | POA: Diagnosis not present

## 2022-04-01 ENCOUNTER — Ambulatory Visit (INDEPENDENT_AMBULATORY_CARE_PROVIDER_SITE_OTHER): Payer: PPO | Admitting: Family Medicine

## 2022-04-01 VITALS — Ht 67.5 in | Wt 192.2 lb

## 2022-04-01 DIAGNOSIS — E7800 Pure hypercholesterolemia, unspecified: Secondary | ICD-10-CM | POA: Diagnosis not present

## 2022-04-01 DIAGNOSIS — I480 Paroxysmal atrial fibrillation: Secondary | ICD-10-CM

## 2022-04-01 NOTE — Progress Notes (Signed)
Subjective:    Patient ID: Tara Brennan, female    DOB: 1951/08/04, 71 y.o.   MRN: [id number]  HPI  Patient is a very pleasant 71 year old Caucasian female who presents today for a checkup.  Nov 2020, she had an episode of atrial fibrillation.  She is uncertain as to what caused it.  She went into rapid ventricular response and went to the emergency room.  After receiving IV Cardizem, she spontaneously converted back to normal sinus rhythm and she remains in normal sinus rhythm today.  I calculated her CHA2DS2-VASc 2 score to be 3.  Patient is in normal sinus rhythm today.  She denies any palpitations or shortness of breath or chest pain.  She stopped Fosamax due to cost.  She is trying to keep her prescription cost down.  I explained that her bone density showed osteoporosis with a T score of -2.6.  The Fosamax helps prevent hip fractures and vertebral fractures.  The patient is doing very good at taking her calcium and vitamin D every day.  She elects not to switch to Coumadin due to the cost savings, versus the hassle of coming in for blood draws.  She denies any syncope or near syncope or lightheadedness.  On her last cholesterol she had dropped her LDL almost 30 points.  She is taking 2000 mg omega-3 fatty acids a day Past Medical History:  Diagnosis Date   Anxiety    GERD (gastroesophageal reflux disease)    Osteopenia    Ovarian cyst    Paroxysmal atrial fibrillation [facility])    Diagnosed November 2020     Past Surgical History:  Procedure Laterality Date   ABDOMINAL HYSTERECTOMY  1984   ESOPHAGOGASTRODUODENOSCOPY  06/28/2012   Procedure: ESOPHAGOGASTRODUODENOSCOPY (EGD);  Surgeon: Jeryl Columbia, MD;  Location: [facility] ENDOSCOPY;  Service: Endoscopy;  Laterality: N/A;   OVARIAN CYST SURGERY     PARTIAL HYSTERECTOMY     Current Outpatient Medications on File Prior to Visit  Medication Sig Dispense Refill   acetaminophen (TYLENOL) 500 MG tablet Take 500 mg by mouth every 6 (six) hours  as needed.     alendronate (FOSAMAX) 70 MG tablet TAKE (1) TABLET BY MOUTH ONCE A WEEK. 12 tablet 0   apixaban (ELIQUIS) 5 MG TABS tablet TAKE (1) TABLET BY MOUTH TWICE DAILY. 180 tablet 3   calcium carbonate (TUMS - DOSED IN MG ELEMENTAL CALCIUM) 500 MG chewable tablet Chew 1 tablet by mouth daily.     cholecalciferol (VITAMIN D) 1000 units tablet Take 2,000 Units by mouth daily.      diltiazem (CARDIZEM CD) 180 MG 24 hr capsule TAKE 1 CAPSULE(180 MG) BY MOUTH DAILY 90 capsule 3   Multiple Vitamins-Minerals (ONE-A-DAY WOMENS 50 PLUS) TABS  (Patient not taking: Reported on 01/28/2022)     omega-3 acid ethyl esters (LOVAZA) 1 g capsule Take by mouth 2 (two) times daily. Over the counter, '700mg'$ --pt buys     Vitamin E 400 units TABS      No current facility-administered medications on file prior to visit.   No Known Allergies Social History   Socioeconomic History   Marital status: Married    Spouse name: [name]   Number of children: 2   Years of education: Not on file   Highest education level: Not on file  Occupational History   Not on file  Tobacco Use   Smoking status: Never   Smokeless tobacco: Never  Vaping Use   Vaping Use: Never used  Substance and Sexual Activity   Alcohol use: No   Drug use: No   Sexual activity: Yes    Birth control/protection: Surgical  Other Topics Concern   Not on file  Social History Narrative   ** Merged History Encounter **    2 sons   2 grand children    Social Determinants of Health   Financial Resource Strain: Low Risk  (06/18/2021)   Overall Financial Resource Strain (CARDIA)    Difficulty of Paying Living Expenses: Not hard at all  Food Insecurity: No Food Insecurity (06/18/2021)   Hunger Vital Sign    Worried About Running Out of Food in the Last Year: Never true    Ran Out of Food in the Last Year: Never true  Transportation Needs: No Transportation Needs (06/18/2021)   PRAPARE - Hydrologist (Medical):  No    Lack of Transportation (Non-Medical): No  Physical Activity: Sufficiently Active (06/18/2021)   Exercise Vital Sign    Days of Exercise per Week: 5 days    Minutes of Exercise per Session: 30 min  Stress: No Stress Concern Present (06/18/2021)   [place]    Feeling of Stress : Not at all  Social Connections: [place] (06/18/2021)   Social Connection and Isolation Panel [NHANES]    Frequency of Communication with Friends and Family: More than three times a week    Frequency of Social Gatherings with Friends and Family: More than three times a week    Attends Religious Services: More than 4 times per year    Active Member of [employer] or Organizations: Yes    Attends Archivist Meetings: More than 4 times per year    Marital Status: Married  Human resources officer Violence: Not At Risk (06/18/2021)   Humiliation, Afraid, Rape, and Kick questionnaire    Fear of Current or Ex-Partner: No    Emotionally Abused: No    Physically Abused: No    Sexually Abused: No    Review of Systems     Objective:   Physical Exam Vitals reviewed.  Constitutional:      Appearance: She is normal weight.  Cardiovascular:     Rate and Rhythm: Normal rate and regular rhythm.     Pulses: Normal pulses.     Heart sounds: Normal heart sounds. No murmur heard.    No friction rub. No gallop.  Pulmonary:     Effort: Pulmonary effort is normal. No respiratory distress.     Breath sounds: Normal breath sounds. No wheezing, rhonchi or rales.  Abdominal:     General: Abdomen is flat. Bowel sounds are normal. There is no distension.     Palpations: Abdomen is soft. There is no mass.     Tenderness: There is no abdominal tenderness. There is no guarding.     Hernia: No hernia is present.  Musculoskeletal:     Right lower leg: No edema.     Left lower leg: No edema.  Neurological:     Mental Status: She is alert.            Assessment & Plan:  Paroxysmal atrial fibrillation (HCC) - Plan: Lipid panel, COMPLETE METABOLIC PANEL WITH GFR, CBC with Differential/Platelet, Amb Ref to Medical Weight Management  Pure hypercholesterolemia - Plan: Lipid panel, COMPLETE METABOLIC PANEL WITH GFR, CBC with Differential/Platelet, Amb Ref to Medical Weight Management I was very proud of the patient for dropping her  cholesterol last time.  I would like to repeat it to keep an eye on it.  I encouraged her to take the Fosamax, 1200 mg a day of calcium, and 1000 units a day of vitamin D.  Also recommended a flu shot, COVID booster, and the shingles vaccine.

## 2022-04-02 ENCOUNTER — Other Ambulatory Visit: Payer: Self-pay | Admitting: Family Medicine

## 2022-04-04 NOTE — Telephone Encounter (Signed)
Requested medications are due for refill today.  yes  Requested medications are on the active medications list.  yes  Last refill. 11/24/2021 #12 0 refills  Future visit scheduled.   no  Notes to clinic.  Labs are expired.    Requested Prescriptions  Pending Prescriptions Disp Refills   alendronate (FOSAMAX) 70 MG tablet [Pharmacy Med Name: ALENDRONATE SODIUM 70 MG TAB] 12 tablet 0    Sig: TAKE (1) TABLET BY MOUTH ONCE A WEEK.     Endocrinology:  Bisphosphonates Failed - 04/02/2022 10:48 AM      Failed - Vitamin D in normal range and within 360 days    Vit D, 25-Hydroxy  Date Value Ref Range Status  04/03/2020 57 30 - 100 ng/mL Final    Comment:    Vitamin D Status         25-OH Vitamin D: . Deficiency:                    <20 ng/mL Insufficiency:             20 - 29 ng/mL Optimal:                 > or = 30 ng/mL . For 25-OH Vitamin D testing on patients on  D2-supplementation and patients for whom quantitation  of D2 and D3 fractions is required, the Quest[facility]ureD(TM) 25-OH VIT D, (D2,D3), LC/MS/MS is recommended: order  code [phone] (patients >60yr). See Note 1 . Note 1 . For additional information, please refer to  http://education.Quest[facility]ostics.com/faq/FAQ199  (This link is being provided for informational/ educational purposes only.)          Failed - Mg Level in normal range and within 360 days    Magnesium  Date Value Ref Range Status  06/23/2019 2.3 1.7 - 2.4 mg/dL Final    Comment:    [facility] [address], [place] [address]        Failed - Phosphate in normal range and within 360 days    No results found for: "PHOS"       Passed - Ca in normal range and within 360 days    Calcium  Date Value Ref Range Status  10/01/2021 10.4 8.6 - 10.4 mg/dL Final         Passed - Cr in normal range and within 360 days    Creat  Date Value Ref Range Status  10/01/2021 0.76 0.50 - 1.05 mg/dL Final         Passed - eGFR is 30 or  above and within 360 days    GFR, Est African American  Date Value Ref Range Status  10/20/2020 88 > OR = 60 mL/min/1.779mFinal   GFR, Est Non African American  Date Value Ref Range Status  10/20/2020 76 > OR = 60 mL/min/1.7358minal   eGFR  Date Value Ref Range Status  10/01/2021 85 > OR = 60 mL/min/1.74m39mnal    Comment:    The eGFR is based on the CKD-EPI 2021 equation. To calculate  the new eGFR from a previous Creatinine or Cystatin C result, go to https://www.kidney.org/professionals/ kdoqi/gfr%5Fcalculator          Passed - Valid encounter within last 12 months    Recent Outpatient Visits           6 months ago [facility], W[place]ammie Mcgee   1 year  ago Paroxysmal atrial fibrillation ([place])   [place] Pickard, Cammie Mcgee, MD   1 year ago Thyroid nodule   [place] Dennard Schaumann, Cammie Mcgee, MD   1 year ago LLQ abdominal pain   [place] Eulogio Bear, NP   1 year ago Rib pain   [place] Pickard, Cammie Mcgee, MD              Passed - Bone Mineral Density or Dexa Scan completed in the last 2 years

## 2022-04-08 DIAGNOSIS — E7800 Pure hypercholesterolemia, unspecified: Secondary | ICD-10-CM | POA: Diagnosis not present

## 2022-04-08 DIAGNOSIS — I480 Paroxysmal atrial fibrillation: Secondary | ICD-10-CM | POA: Diagnosis not present

## 2022-04-09 LAB — COMPLETE METABOLIC PANEL WITH GFR
AG Ratio: 1.8 (calc) (ref 1.0–2.5)
ALT: 18 U/L (ref 6–29)
AST: 21 U/L (ref 10–35)
Albumin: 4.4 g/dL (ref 3.6–5.1)
Alkaline phosphatase (APISO): 73 U/L (ref 37–153)
BUN: 22 mg/dL (ref 7–25)
CO2: 28 mmol/L (ref 20–32)
Calcium: 9.9 mg/dL (ref 8.6–10.4)
Chloride: 105 mmol/L (ref 98–110)
Creat: 0.79 mg/dL (ref 0.60–1.00)
Globulin: 2.5 g/dL (calc) (ref 1.9–3.7)
Glucose, Bld: 100 mg/dL — ABNORMAL HIGH (ref 65–99)
Potassium: 5.1 mmol/L (ref 3.5–5.3)
Sodium: 141 mmol/L (ref 135–146)
Total Bilirubin: 0.6 mg/dL (ref 0.2–1.2)
Total Protein: 6.9 g/dL (ref 6.1–8.1)
eGFR: 80 mL/min/{1.73_m2} (ref >=60)

## 2022-04-09 LAB — CBC WITH DIFFERENTIAL/PLATELET
Absolute Monocytes: 456 cells/uL (ref 200–950)
Basophils Absolute: 40 cells/uL (ref 0–200)
Basophils Relative: 0.7 %
Eosinophils Absolute: 148 cells/uL (ref 15–500)
Eosinophils Relative: 2.6 %
HCT: 39.9 % (ref 35.0–45.0)
Hemoglobin: 13.3 g/dL (ref 11.7–15.5)
Lymphs Abs: 1499 cells/uL (ref 850–3900)
MCH: 29 pg (ref 27.0–33.0)
MCHC: 33.3 g/dL (ref 32.0–36.0)
MCV: 86.9 fL (ref 80.0–100.0)
MPV: 11.4 fL (ref 7.5–12.5)
Monocytes Relative: 8 %
Neutro Abs: 3557 cells/uL (ref 1500–7800)
Neutrophils Relative %: 62.4 %
Platelets: 283 10*3/uL (ref 140–400)
RBC: 4.59 10*6/uL (ref 3.80–5.10)
RDW: 12.8 % (ref 11.0–15.0)
Total Lymphocyte: 26.3 %
WBC: 5.7 10*3/uL (ref 3.8–10.8)

## 2022-04-09 LAB — LIPID PANEL
Cholesterol: 217 mg/dL — ABNORMAL HIGH (ref <200)
HDL: 58 mg/dL (ref >=50)
LDL Cholesterol (Calc): 139 mg/dL (calc) — ABNORMAL HIGH
Non-HDL Cholesterol (Calc): 159 mg/dL (calc) — ABNORMAL HIGH (ref <130)
Total CHOL/HDL Ratio: 3.7 (calc) (ref <5.0)
Triglycerides: 95 mg/dL (ref <150)

## 2022-04-13 ENCOUNTER — Encounter: Payer: Self-pay | Admitting: Family Medicine

## 2022-04-13 ENCOUNTER — Ambulatory Visit: Payer: Self-pay | Admitting: *Deleted

## 2022-04-13 NOTE — Telephone Encounter (Signed)
Three attempts to reach pt. Left VM to call back last attempt.Please see previous encounters.

## 2022-04-13 NOTE — Telephone Encounter (Signed)
Attempted to reach pt, husband answered call. Stated pt was out until after lunch time. Questioned if there was another number I could reach her at, states "Not that I know of." Will continue attempts to reach.

## 2022-04-13 NOTE — Telephone Encounter (Signed)
Call from [facility] regarding patient with chest pain- unable to transfer call-attempted to call patient back- left message to call office on voice message

## 2022-04-13 NOTE — Telephone Encounter (Signed)
Pls call pt to make a OV for R-side shoulder/body pain  Thank you.

## 2022-04-13 NOTE — Telephone Encounter (Signed)
Attempted to call patient- no answer- left message on VM to call office

## 2022-04-13 NOTE — Telephone Encounter (Signed)
  Chief Complaint: right side chest discomfort Symptoms: chest discomfort, right side to shoulder blade. Hurts when swallowing in chest area, pain comes and goes but is more constant now. Took tylenol for pain with no relief took advil and said it has helped. C/o dull pain sweating this am not now. Cough when wakes up  Frequency: Monday 04/11/22 Pertinent Negatives: Patient denies difficulty breathing no fever, no dizziness or lightheadedness Disposition: '[]'$ ED /'[]'$ Urgent Care (no appt availability in office) / '[x]'$ Appointment(In office/virtual)/ '[]'$  [place] [facility]/ '[]'$ Home Care/ '[]'$ Refused Recommended Disposition /'[]'$ [place] [facility]/ '[]'$  Follow-up with PCP Additional Notes:   Appt scheduled for tomorrow. Instructed patient if chest pain or sx worsen go to ED or call 911. Patient verbalized understanding.     Reason for Disposition  [1] Chest pain lasts > 5 minutes AND [2] occurred > 3 days ago (72 hours) AND [3] NO chest pain or cardiac symptoms now    Chest discomfort  Answer Assessment - Initial Assessment Questions 1. LOCATION: "Where does it hurt?"       Right chest pain to shoulder blade and right breast 2. RADIATION: "Does the pain go anywhere else?" (e.g., into neck, jaw, arms, back)     Right back  3. ONSET: "When did the chest pain begin?" (Minutes, hours or days)      Monday evening  4. PATTERN: "Does the pain come and go, or has it been constant since it started?"  "Does it get worse with exertion?"      Comes and goes dull ache 5. DURATION: "How long does it last" (e.g., seconds, minutes, hours)     Comes and goes does not completely go away  6. SEVERITY: "How bad is the pain?"  (e.g., Scale 1-10; mild, moderate, or severe)    - MILD (1-3): doesn't interfere with normal activities     - MODERATE (4-7): interferes with normal activities or awakens from sleep    - SEVERE (8-10): excruciating pain, unable to do any normal activities       Mild  7. CARDIAC RISK FACTORS:  "Do you have any history of heart problems or risk factors for heart disease?" (e.g., angina, prior heart attack; diabetes, high blood pressure, high cholesterol, smoker, or strong family history of heart disease)     Hx A fib 8. PULMONARY RISK FACTORS: "Do you have any history of lung disease?"  (e.g., blood clots in lung, asthma, emphysema, birth control pills)     na 9. CAUSE: "What do you think is causing the chest pain?"     Not sure  10. OTHER SYMPTOMS: "Do you have any other symptoms?" (e.g., dizziness, nausea, vomiting, sweating, fever, difficulty breathing, cough)       Sweating this am . Cough in am when wakes up  11. PREGNANCY: "Is there any chance you are pregnant?" "When was your last menstrual period?"       na  Protocols used: Chest Pain-A-AH

## 2022-04-14 ENCOUNTER — Ambulatory Visit (INDEPENDENT_AMBULATORY_CARE_PROVIDER_SITE_OTHER): Payer: PPO | Admitting: Family Medicine

## 2022-04-14 ENCOUNTER — Encounter: Payer: Self-pay | Admitting: Family Medicine

## 2022-04-14 VITALS — BP 146/86 | HR 64 | Temp 98.2°F | Ht 67.5 in | Wt 187.0 lb

## 2022-04-14 DIAGNOSIS — R079 Chest pain, unspecified: Secondary | ICD-10-CM

## 2022-04-14 MED ORDER — PANTOPRAZOLE SODIUM 40 MG PO TBEC: 40.0000 mg | DELAYED_RELEASE_TABLET | Freq: Every day | ORAL | 3 refills | Status: DC

## 2022-04-14 NOTE — Progress Notes (Signed)
Subjective:    Patient ID: Tara Brennan, female    DOB: 30-Jan-1951, 71 y.o.   MRN: [id number]  Chest Pain     Patient went to the emergency room in April 2022 for right-sided chest pain.  At that time she had a CT angiogram of the chest that revealed no evidence of a pulmonary embolism.   FINDINGS: Cardiovascular: The heart size is normal. No substantial pericardial effusion. Atherosclerotic calcification is noted in the wall of the thoracic aorta. There is no filling defect within the opacified pulmonary arteries to suggest the presence of an acute pulmonary embolus.   Mediastinum/Nodes: No mediastinal lymphadenopathy. There is no hilar lymphadenopathy. Tiny hiatal hernia. The esophagus has normal imaging features. There is no axillary lymphadenopathy. 2.9 cm left thyroid nodule evident.   Lungs/Pleura: No suspicious pulmonary nodule or mass. No focal airspace consolidation. No pleural effusion. Subsegmental atelectasis noted in the lung bases bilaterally.   Upper Abdomen: The liver shows diffusely decreased attenuation suggesting fat deposition.   Musculoskeletal: No worrisome lytic or sclerotic osseous abnormality. L1 compression fracture noted.   I saw the patient in June 2023.  At that time she was having pain in the right mid axillary line.  The pain was not reproducible.  She states that about a week ago the pain returned.  Now it is in the right anterior chest.  She states that it feels like it is directly in her right breast.  She describes it as a heaviness.  She denies any fever or chills or shortness of breath.  She denies any pleurisy or hemoptysis.  She does have a history of a hiatal hernia and she does have occasional acid reflux.  She denies any melena or hematochezia.  She does still have her gallbladder and she has not been evaluated for gallstones.  She is not currently taking a proton pump inhibitor.  I am unable to reproduce the pain with palpation on the  chest wall, the sternum, or the ribs.  Cervical below shows the location of the chest pain. Past Medical History:  Diagnosis Date   Anxiety    GERD (gastroesophageal reflux disease)    Osteopenia    Ovarian cyst    Paroxysmal atrial fibrillation [facility])    Diagnosed November 2020     Past Surgical History:  Procedure Laterality Date   ABDOMINAL HYSTERECTOMY  1984   ESOPHAGOGASTRODUODENOSCOPY  06/28/2012   Procedure: ESOPHAGOGASTRODUODENOSCOPY (EGD);  Surgeon: Jeryl Columbia, MD;  Location: [facility] ENDOSCOPY;  Service: Endoscopy;  Laterality: N/A;   OVARIAN CYST SURGERY     PARTIAL HYSTERECTOMY     Current Outpatient Medications on File Prior to Visit  Medication Sig Dispense Refill   acetaminophen (TYLENOL) 500 MG tablet Take 500 mg by mouth every 6 (six) hours as needed.     alendronate (FOSAMAX) 70 MG tablet TAKE (1) TABLET BY MOUTH ONCE A WEEK. 12 tablet 0   apixaban (ELIQUIS) 5 MG TABS tablet TAKE (1) TABLET BY MOUTH TWICE DAILY. 180 tablet 3   calcium carbonate (TUMS - DOSED IN MG ELEMENTAL CALCIUM) 500 MG chewable tablet Chew 1 tablet by mouth daily.     cholecalciferol (VITAMIN D) 1000 units tablet Take 2,000 Units by mouth daily.      diltiazem (CARDIZEM CD) 180 MG 24 hr capsule TAKE 1 CAPSULE(180 MG) BY MOUTH DAILY 90 capsule 3   omega-3 acid ethyl esters (LOVAZA) 1 g capsule Take by mouth 2 (two) times daily. Over the counter, '700mg'$ --pt  buys     Vitamin E 400 units TABS      No current facility-administered medications on file prior to visit.   No Known Allergies Social History   Socioeconomic History   Marital status: Married    Spouse name: [name]   Number of children: 2   Years of education: Not on file   Highest education level: Not on file  Occupational History   Not on file  Tobacco Use   Smoking status: Never   Smokeless tobacco: Never  Vaping Use   Vaping Use: Never used  Substance and Sexual Activity   Alcohol use: No   Drug use: No   Sexual activity: Yes     Birth control/protection: Surgical  Other Topics Concern   Not on file  Social History Narrative   ** Merged History Encounter **    2 sons   2 grand children    Social Determinants of Health   Financial Resource Strain: Low Risk  (06/18/2021)   Overall Financial Resource Strain (CARDIA)    Difficulty of Paying Living Expenses: Not hard at all  Food Insecurity: No Food Insecurity (06/18/2021)   Hunger Vital Sign    Worried About Running Out of Food in the Last Year: Never true    [place] in the Last Year: Never true  Transportation Needs: No Transportation Needs (06/18/2021)   PRAPARE - Hydrologist (Medical): No    Lack of Transportation (Non-Medical): No  Physical Activity: Sufficiently Active (06/18/2021)   Exercise Vital Sign    Days of Exercise per Week: 5 days    Minutes of Exercise per Session: 30 min  Stress: No Stress Concern Present (06/18/2021)   [place]    Feeling of Stress : Not at all  Social Connections: [place] (06/18/2021)   Social Connection and Isolation Panel [NHANES]    Frequency of Communication with Friends and Family: More than three times a week    Frequency of Social Gatherings with Friends and Family: More than three times a week    Attends Religious Services: More than 4 times per year    Active Member of [employer] or Organizations: Yes    Attends Archivist Meetings: More than 4 times per year    Marital Status: Married  Human resources officer Violence: Not At Risk (06/18/2021)   Humiliation, Afraid, Rape, and Kick questionnaire    Fear of Current or Ex-Partner: No    Emotionally Abused: No    Physically Abused: No    Sexually Abused: No    Review of Systems  Cardiovascular:  Positive for chest pain.       Objective:   Physical Exam Vitals reviewed.  Constitutional:      Appearance: She is normal weight.   Cardiovascular:     Rate and Rhythm: Normal rate and regular rhythm.     Pulses: Normal pulses.     Heart sounds: Normal heart sounds. No murmur heard.    No friction rub. No gallop.  Pulmonary:     Effort: Pulmonary effort is normal. No respiratory distress.     Breath sounds: Normal breath sounds. No wheezing, rhonchi or rales.  Chest:     Chest wall: No mass, lacerations, deformity, swelling, tenderness, crepitus or edema. There is no dullness to percussion.    Abdominal:     General: Abdomen is flat. Bowel sounds are normal. There is no distension.  Palpations: Abdomen is soft. There is no mass.     Tenderness: There is no abdominal tenderness. There is no guarding.     Hernia: No hernia is present.  Musculoskeletal:     Right lower leg: No edema.     Left lower leg: No edema.  Neurological:     Mental Status: She is alert.           Assessment & Plan:  Right-sided chest pain  Patient has had intermittent right-sided chest pain for the last year and a half.  I am suspecting the patient may have atypical reflux coupled with a hiatal hernia versus biliary tract disease.  I will start the patient on Protonix 40 mg daily and of asked her to hold her Fosamax.  If the pain persist, I would recommend a right upper quadrant ultrasound.  If she develops any shortness of breath, fever, hemoptysis, or left-sided chest pain normal her to go to the emergency room or to see me immediately.  Recheck in 1 week.

## 2022-04-15 ENCOUNTER — Ambulatory Visit (HOSPITAL_COMMUNITY)
Admission: RE | Admit: 2022-04-15 | Discharge: 2022-04-15 | Disposition: A | Discharge status: Home / Self Care | Payer: PPO | Source: Ambulatory Visit | Attending: Family Medicine | Admitting: Family Medicine

## 2022-04-15 ENCOUNTER — Ambulatory Visit: Payer: Self-pay

## 2022-04-15 ENCOUNTER — Other Ambulatory Visit: Payer: Self-pay | Admitting: Family Medicine

## 2022-04-15 DIAGNOSIS — R079 Chest pain, unspecified: Secondary | ICD-10-CM

## 2022-04-15 DIAGNOSIS — J439 Emphysema, unspecified: Secondary | ICD-10-CM | POA: Diagnosis not present

## 2022-04-15 MED ORDER — HYDROCODONE-ACETAMINOPHEN 5-325 MG PO TABS: 1.0000 | ORAL_TABLET | Freq: Four times a day (QID) | ORAL | 0 refills | Status: DC | PRN

## 2022-04-15 MED ORDER — VALACYCLOVIR HCL 1 G PO TABS: 1000.0000 mg | ORAL_TABLET | Freq: Three times a day (TID) | ORAL | 0 refills | Status: AC

## 2022-04-15 NOTE — Telephone Encounter (Signed)
  Chief Complaint: burning, stinging, tingling, severe in intensity Symptoms: back sx worse than front, few episodes of nausea Frequency:  Pertinent Negatives: Patient denies rash, radiating chest pain, SOB, breathing difficulty Disposition: '[]'$ ED /'[]'$ Urgent Care (no appt availability in office) / '[]'$ Appointment(In office/virtual)/ '[]'$  [place] [facility]/ '[]'$ Home Care/ '[]'$ Refused Recommended Disposition /'[]'$  Mobile Bus/ '[x]'$  Follow-up with PCP Additional Notes: asked to triage pt  Reason for Disposition  Rash in same area as pain (may be described as "small blisters")    No rash yet  Answer Assessment - Initial Assessment Questions 1. LOCATION: "Where does it hurt?"       Right breast, to the back of right shoulder blade and occasionally shoots pain to right arm 2. RADIATION: "Does the pain go anywhere else?" (e.g., into neck, jaw, arms, back)     See above 3. ONSET: "When did the chest pain begin?" (Minutes, hours or days)      *No Answer* 4. PATTERN: "Does the pain come and go, or has it been constant since it started?"  "Does it get worse with exertion?"      constant 5. DURATION: "How long does it last" (e.g., seconds, minutes, hours)     N/a 6. SEVERITY: "How bad is the pain?"  (e.g., Scale 1-10; mild, moderate, or severe)    - MILD (1-3): doesn't interfere with normal activities     - MODERATE (4-7): interferes with normal activities or awakens from sleep    - SEVERE (8-10): excruciating pain, unable to do any normal activities       Severe- tingling, severe burning, and stinging pain 7. CARDIAC RISK FACTORS: "Do you have any history of heart problems or risk factors for heart disease?" (e.g., angina, prior heart attack; diabetes, high blood pressure, high cholesterol, smoker, or strong family history of heart disease)     A fib 8. PULMONARY RISK FACTORS: "Do you have any history of lung disease?"  (e.g., blood clots in lung, asthma, emphysema, birth control pills)      no 9. CAUSE: "What do you think is causing the chest pain?"     shingles 10. OTHER SYMPTOMS: "Do you have any other symptoms?" (e.g., dizziness, nausea, vomiting, sweating, fever, difficulty breathing, cough)       Had a few episodes last night of nausea- did not sleep until 0400 11. PREGNANCY: "Is there any chance you are pregnant?" "When was your last menstrual period?"       N/a  Protocols used: Chest Pain-A-AH

## 2022-04-18 ENCOUNTER — Other Ambulatory Visit: Payer: Self-pay | Admitting: Family Medicine

## 2022-04-18 MED ORDER — GABAPENTIN 300 MG PO CAPS: 300.0000 mg | ORAL_CAPSULE | Freq: Three times a day (TID) | ORAL | 3 refills | Status: DC | PRN

## 2022-05-10 ENCOUNTER — Other Ambulatory Visit: Payer: Self-pay | Admitting: Family Medicine

## 2022-05-19 ENCOUNTER — Ambulatory Visit (INDEPENDENT_AMBULATORY_CARE_PROVIDER_SITE_OTHER): Payer: PPO

## 2022-05-19 DIAGNOSIS — I4891 Unspecified atrial fibrillation: Secondary | ICD-10-CM

## 2022-05-19 DIAGNOSIS — Z23 Encounter for immunization: Secondary | ICD-10-CM

## 2022-06-17 ENCOUNTER — Encounter: Payer: Self-pay | Admitting: Family Medicine

## 2022-06-17 ENCOUNTER — Telehealth: Payer: Self-pay

## 2022-06-17 NOTE — Telephone Encounter (Signed)
Called pt regarding pt's msg, told pt that should be ok, suggest take the one said for cold,cough and sneezing which are her symptoms per pt. Told pt if its not helping and then to make an appt w/provider. Pt voiced understanding and nothing further.

## 2022-06-17 NOTE — Telephone Encounter (Signed)
Pt called in wanting to ask nurse if she could take otc Coricidin for her cold symptoms. Pt just wanted to be sure that this is something she could take. Please advise.  Cb#: [phone]

## 2022-06-21 ENCOUNTER — Encounter: Payer: Self-pay | Admitting: Family Medicine

## 2022-06-22 ENCOUNTER — Other Ambulatory Visit: Payer: Self-pay | Admitting: Family Medicine

## 2022-06-23 ENCOUNTER — Telehealth: Payer: Self-pay

## 2022-06-23 ENCOUNTER — Other Ambulatory Visit: Payer: Self-pay | Admitting: Family Medicine

## 2022-06-23 MED ORDER — AMOXICILLIN 875 MG PO TABS: 875.0000 mg | ORAL_TABLET | Freq: Two times a day (BID) | ORAL | 0 refills | Status: AC

## 2022-06-23 NOTE — Telephone Encounter (Signed)
Pt states she had sneezing, runny nose last week, did Coricidin HBP and it helped clear up sx. Pt now has thick, green mucous, cough but no fever. Pt asks if she could possibly have a sinus infection? Pt asks what you would recommend that she try?

## 2022-06-29 ENCOUNTER — Telehealth: Payer: Self-pay | Admitting: Family Medicine

## 2022-06-29 NOTE — Telephone Encounter (Signed)
Left message for patient to call back and schedule Medicare Annual Wellness Visit (AWV) in office.   If not able to come in office, please offer to do virtually or by telephone.   Last AWV: 06/18/2021   Please schedule at anytime with [facility]  If any questions, please contact me at [phone].  Thank you ,  Colletta [place]

## 2022-07-04 ENCOUNTER — Other Ambulatory Visit: Payer: Self-pay | Admitting: Cardiology

## 2022-07-07 DIAGNOSIS — D225 Melanocytic nevi of trunk: Secondary | ICD-10-CM | POA: Diagnosis not present

## 2022-07-07 DIAGNOSIS — D485 Neoplasm of uncertain behavior of skin: Secondary | ICD-10-CM | POA: Diagnosis not present

## 2022-07-07 DIAGNOSIS — L820 Inflamed seborrheic keratosis: Secondary | ICD-10-CM | POA: Diagnosis not present

## 2022-07-07 DIAGNOSIS — L821 Other seborrheic keratosis: Secondary | ICD-10-CM | POA: Diagnosis not present

## 2022-07-07 DIAGNOSIS — D2271 Melanocytic nevi of right lower limb, including hip: Secondary | ICD-10-CM | POA: Diagnosis not present

## 2022-08-09 ENCOUNTER — Encounter: Payer: Self-pay | Admitting: Cardiology

## 2022-08-09 ENCOUNTER — Encounter: Payer: Self-pay | Admitting: Family Medicine

## 2022-08-09 ENCOUNTER — Other Ambulatory Visit: Payer: Self-pay | Admitting: *Deleted

## 2022-08-09 ENCOUNTER — Other Ambulatory Visit: Payer: Self-pay

## 2022-08-09 DIAGNOSIS — K219 Gastro-esophageal reflux disease without esophagitis: Secondary | ICD-10-CM

## 2022-08-09 DIAGNOSIS — R1310 Dysphagia, unspecified: Secondary | ICD-10-CM

## 2022-08-09 DIAGNOSIS — K5792 Diverticulitis of intestine, part unspecified, without perforation or abscess without bleeding: Secondary | ICD-10-CM

## 2022-08-09 DIAGNOSIS — I480 Paroxysmal atrial fibrillation: Secondary | ICD-10-CM

## 2022-08-09 MED ORDER — PANTOPRAZOLE SODIUM 40 MG PO TBEC: DELAYED_RELEASE_TABLET | ORAL | 3 refills | Status: DC

## 2022-08-09 MED ORDER — APIXABAN 5 MG PO TABS: ORAL_TABLET | ORAL | 3 refills | Status: DC

## 2022-08-09 NOTE — Telephone Encounter (Addendum)
Prescription refill request for Eliquis received. Indication: PAF Last office visit: 08/30/21  B Strader PA-C Scr: 0.79 on 04/08/22 Age: 72 Weight: 86.1kg  Based on above findings Eliquis '5mg'$  twice daily is the appropriate dose.  Refill approved.  Has appt 09/30/22 with Dr Domenic Polite.

## 2022-08-10 ENCOUNTER — Other Ambulatory Visit: Payer: Self-pay | Admitting: *Deleted

## 2022-08-10 DIAGNOSIS — I480 Paroxysmal atrial fibrillation: Secondary | ICD-10-CM

## 2022-08-10 MED ORDER — APIXABAN 5 MG PO TABS: ORAL_TABLET | ORAL | 1 refills | Status: DC

## 2022-08-29 ENCOUNTER — Telehealth: Payer: Self-pay | Admitting: Family Medicine

## 2022-08-29 NOTE — Telephone Encounter (Signed)
Left message for patient to call back and schedule Medicare Annual Wellness Visit (AWV) in office.   If not able to come in office, please offer to do virtually or by telephone.   Last AWV:06/18/2021   Please schedule at any time with [facility]Nurse Health Advisor.  30 minute appointment  Any questions, please contact me at [phone]   Thank you,   [facility]  Ambulatory Clinical Support for [place] Are. We Are. [facility] ??[phone] or ??[phone]

## 2022-09-05 ENCOUNTER — Telehealth: Payer: Self-pay | Admitting: Family Medicine

## 2022-09-05 NOTE — Telephone Encounter (Signed)
Left message for patient to call back and schedule Medicare Annual Wellness Visit (AWV) in office.   If not able to come in office, please offer to do virtually or by telephone.   Last AWV: 06/18/2021   Please schedule at any time with [facility]Nurse Health Advisor.  30 minute appointment  Any questions, please contact me at [phone]   Thank you,   [facility]  Ambulatory Clinical Support for [place] Are. We Are. [facility] ??[phone] or ??[phone]

## 2022-09-07 ENCOUNTER — Encounter: Payer: Self-pay | Admitting: Family Medicine

## 2022-09-19 ENCOUNTER — Other Ambulatory Visit: Payer: Self-pay | Admitting: Family Medicine

## 2022-09-20 ENCOUNTER — Encounter: Payer: Self-pay | Admitting: Family Medicine

## 2022-09-20 ENCOUNTER — Other Ambulatory Visit: Payer: Self-pay

## 2022-09-20 DIAGNOSIS — M810 Age-related osteoporosis without current pathological fracture: Secondary | ICD-10-CM

## 2022-09-20 MED ORDER — ALENDRONATE SODIUM 70 MG PO TABS: ORAL_TABLET | ORAL | 1 refills | Status: DC

## 2022-09-21 ENCOUNTER — Ambulatory Visit (INDEPENDENT_AMBULATORY_CARE_PROVIDER_SITE_OTHER): Payer: PPO

## 2022-09-21 VITALS — Ht 67.5 in | Wt 181.0 lb

## 2022-09-21 DIAGNOSIS — R1032 Left lower quadrant pain: Secondary | ICD-10-CM | POA: Insufficient documentation

## 2022-09-21 DIAGNOSIS — Z0000 Encounter for general adult medical examination without abnormal findings: Secondary | ICD-10-CM | POA: Diagnosis not present

## 2022-09-21 DIAGNOSIS — Z83719 Family history of colon polyps, unspecified: Secondary | ICD-10-CM | POA: Insufficient documentation

## 2022-09-21 DIAGNOSIS — I259 Chronic ischemic heart disease, unspecified: Secondary | ICD-10-CM | POA: Insufficient documentation

## 2022-09-21 NOTE — Progress Notes (Signed)
Subjective:   Tara Brennan is a 72 y.o. female who presents for Medicare Annual (Subsequent) preventive examination.  PHONE VISIT.  Review of Systems     Cardiac Risk Factors include: advanced age (>51mn, >>59women);Other (see comment), Risk factor comments: A-fib, knee pain.     Objective:    Today's Vitals   09/21/22 0849  Weight: 181 lb (82.1 kg)  Height: 5' 7.5" (1.715 m)   Body mass index is 27.93 kg/m.     09/21/2022    8:53 AM 06/18/2021    3:00 PM 06/23/2019   12:00 PM 11/30/2017    6:58 AM 09/04/2017    1:12 PM 06/28/2012    7:55 AM  Advanced Directives  Does Patient Have a Medical Advance Directive? Yes Yes No No No Patient does not have advance directive  Type of Advance Directive [place]? No - copy requested No - copy requested      Would patient like information on creating a medical advance directive?   No - Patient declined  No - Patient declined   Pre-existing out of facility DNR order (yellow form or pink MOST form)      No    Current Medications (verified) Outpatient Encounter Medications as of 09/21/2022  Medication Sig   acetaminophen (TYLENOL) 500 MG tablet Take 500 mg by mouth every 6 (six) hours as needed.   alendronate (FOSAMAX) 70 MG tablet Take with a full glass of water on an empty stomach.   apixaban (ELIQUIS) 5 MG TABS tablet TAKE (1) TABLET BY MOUTH TWICE DAILY.   calcium carbonate (TUMS - DOSED IN MG ELEMENTAL CALCIUM) 500 MG chewable tablet Chew 1 tablet by mouth daily.   cholecalciferol (VITAMIN D) 1000 units tablet Take 2,000 Units by mouth daily.    diltiazem (CARDIZEM CD) 180 MG 24 hr capsule TAKE (1) CAPSULE BY MOUTH ONCE DAILY.   gabapentin (NEURONTIN) 300 MG capsule Take 1 capsule (300 mg total) by mouth 3 (three) times daily as needed (nerve pain).   HYDROcodone-acetaminophen (NORCO) 5-325 MG tablet Take 1 tablet  by mouth every 6 (six) hours as needed for moderate pain.   omega-3 acid ethyl esters (LOVAZA) 1 g capsule Take by mouth 2 (two) times daily. Over the counter, 706m-pt buys   pantoprazole (PROTONIX) 40 MG tablet TAKE (1) TABLET BY MOUTH ONCE DAILY.   Vitamin E 400 units TABS    No facility-administered encounter medications on file as of 09/21/2022.    Allergies (verified) Patient has no known allergies.   History: Past Medical History:  Diagnosis Date   Anxiety    GERD (gastroesophageal reflux disease)    Osteopenia    Ovarian cyst    [facility]   Diagnosed November 2020   Past Surgical History:  Procedure Laterality Date   ABDOMINAL HYSTERECTOMY  1984   ESOPHAGOGASTRODUODENOSCOPY  06/28/2012   Procedure: ESOPHAGOGASTRODUODENOSCOPY (EGD);  Surgeon: MaJeryl ColumbiaMD;  [facility];  Service: Endoscopy;  Laterality: N/A;   OVARIAN CYST SURGERY     PARTIAL HYSTERECTOMY     Family History  Problem Relation Age of Onset   Hypertension Mother    Cancer Father    Diabetes Sister    Arthritis Other    Social History   Socioeconomic History   Marital status: Married    Spouse name: JeSonia Side Number  of children: 2   Years of education: Not on file   Highest education level: Not on file  Occupational History   Not on file  Tobacco Use   Smoking status: Never   Smokeless tobacco: Never  Vaping Use   Vaping Use: Never used  Substance and Sexual Activity   Alcohol use: No   Drug use: No   Sexual activity: Yes    Birth control/protection: Surgical  Other Topics Concern   Not on file  Social History Narrative   ** Merged History Encounter **    2 sons   3 grand children    Social Determinants of Health   Financial Resource Strain: Low Risk  (09/21/2022)   Overall Financial Resource Strain (CARDIA)    Difficulty of Paying Living Expenses: Not hard at all  Food Insecurity: No Food Insecurity (09/21/2022)   Hunger Vital Sign     Worried About Running Out of Food in the Last Year: Never true    Ran Out of Food in the Last Year: Never true  Transportation Needs: No Transportation Needs (09/21/2022)   PRAPARE - Hydrologist (Medical): No    Lack of Transportation (Non-Medical): No  Physical Activity: Insufficiently Active (09/21/2022)   Exercise Vital Sign    Days of Exercise per Week: 4 days    Minutes of Exercise per Session: 30 min  Stress: No Stress Concern Present (09/21/2022)   [place]    Feeling of Stress : Not at all  Social Connections: [place] (09/21/2022)   Social Connection and Isolation Panel [NHANES]    Frequency of Communication with Friends and Family: More than three times a week    Frequency of Social Gatherings with Friends and Family: More than three times a week    Attends Religious Services: More than 4 times per year    Active Member of [employer] or Organizations: Yes    Attends Music therapist: More than 4 times per year    Marital Status: Married    Tobacco Counseling Counseling given: Not Answered   Clinical Intake:  Pre-visit preparation completed: Yes  Pain : No/denies pain     BMI - recorded: 27.93 Nutritional Status: BMI 25 -29 Overweight Nutritional Risks: Other (Comment) Diabetes: No  How often do you need to have someone help you when you read instructions, pamphlets, or other written materials from your doctor or pharmacy?: 1 - Never  Diabetic?NO  Interpreter Needed?: No  Information entered by :: mj Satonya Lux, lpn   Activities of Daily Living    09/21/2022    8:54 AM 09/19/2022    8:15 AM  In your present state of health, do you have any difficulty performing the following activities:  Hearing? 0 0  Vision? 0 0  Difficulty concentrating or making decisions? 0 0  Walking or climbing stairs? 0 0  Dressing or bathing? 0 0  Doing errands,  shopping? 0 0  Preparing Food and eating ? N N  Using the Toilet? N N  In the past six months, have you accidently leaked urine? N N  Do you have problems with loss of bowel control? N N  Managing your Medications? N N  Managing your Finances? N N  Housekeeping or managing your Housekeeping? N N    Patient Care Team: Susy Frizzle, MD as PCP - General (Family Medicine) Satira Sark, MD as PCP - Cardiology (Cardiology)  Indicate any recent Medical Services you may have received from other than [facility] providers in the past year (date may be approximate).     Assessment:   This is a routine wellness examination for Bawi.  Hearing/Vision screen Hearing Screening - Comments:: Wears hearing aids as needed.  Vision Screening - Comments:: Readers. [place]  Dietary issues and exercise activities discussed: Current Exercise Habits: Home exercise routine, Type of exercise: strength training/weights;stretching;walking, Time (Minutes): 30, Frequency (Times/Week): 4, Weekly Exercise (Minutes/Week): 120, Intensity: Mild, Exercise limited by: cardiac condition(s);orthopedic condition(s)   Goals Addressed             This Visit's Progress    DIET - REDUCE CALORIE INTAKE       Weight loss.  09/21/22-Pt would like to continue to work on weight loss. Mjp,lpn       Depression Screen    09/21/2022    8:51 AM 04/14/2022   11:53 AM 04/01/2022    7:53 AM 06/18/2021    2:50 PM 03/30/2021    9:31 AM 09/29/2020   10:13 AM 10/04/2019    8:03 AM  PHQ 2/9 Scores  PHQ - 2 Score 0 0 0 0 0 0 0    Fall Risk    09/21/2022    8:54 AM 09/19/2022    8:15 AM 09/05/2022   12:33 PM 04/14/2022   11:54 AM 06/18/2021    3:00 PM  [place] in the past year? 0 0 0 0 0  Number falls in past yr: 0   0 0  Injury with Fall? 0   0 0  Risk for fall due to : No Fall Risks   No Fall Risks No Fall Risks  Follow up Falls prevention discussed   Falls prevention discussed Falls  prevention discussed    FALL RISK PREVENTION PERTAINING TO THE HOME:  Any stairs in or around the home? Yes  If so, are there any without handrails? No  Home free of loose throw rugs in walkways, pet beds, electrical cords, etc? Yes  Adequate lighting in your home to reduce risk of falls? Yes   ASSISTIVE DEVICES UTILIZED TO PREVENT FALLS:  Life alert? No  Use of a cane, walker or w/c? No  Grab bars in the bathroom? Yes  Shower chair or bench in shower? Yes  Elevated toilet seat or a handicapped toilet? Yes   TIMED UP AND GO:  Was the test performed? No .   Cognitive Function:        09/21/2022    8:55 AM 06/18/2021    3:24 PM 06/18/2021    3:05 PM  6CIT Screen  What Year? 0 points 0 points 0 points  What month? 0 points 0 points 0 points  What time? 0 points 0 points 0 points  Count back from 20 0 points 0 points 0 points  Months in reverse 0 points 0 points 0 points  Repeat phrase 0 points 0 points 0 points  Total Score 0 points 0 points 0 points    Immunizations Immunization History  Administered Date(s) Administered   Fluad Quad(high Dose 65+) 06/09/2020, 05/19/2021, 05/19/2022   Influenza-Unspecified 05/09/2019   PFIZER(Purple Top)SARS-COV-2 Vaccination 04/16/2020, 05/07/2020   Pneumococcal Conjugate-13 10/04/2019    TDAP status: Due, Education has been provided regarding the importance of this vaccine. Advised may receive this vaccine at local pharmacy or Health Dept. Aware to provide a copy of the vaccination record if obtained from local pharmacy or  Health Dept. Verbalized acceptance and understanding.  Flu Vaccine status: Up to date  Pneumococcal vaccine status: Due, Education has been provided regarding the importance of this vaccine. Advised may receive this vaccine at local pharmacy or Health Dept. Aware to provide a copy of the vaccination record if obtained from local pharmacy or Health Dept. Verbalized acceptance and understanding.  Covid-19 vaccine  status: Completed vaccines  Qualifies for Shingles Vaccine? Yes   Zostavax completed No   Shingrix Completed?: No.    Education has been provided regarding the importance of this vaccine. Patient has been advised to call insurance company to determine out of pocket expense if they have not yet received this vaccine. Advised may also receive vaccine at local pharmacy or Health Dept. Verbalized acceptance and understanding.  Screening Tests Health Maintenance  Topic Date Due   DTaP/Tdap/Td (1 - Tdap) Never done   Zoster Vaccines- Shingrix (1 of 2) Never done   Pneumonia Vaccine 68+ Years old (2 of 2 - PPSV23 or PCV20) 10/03/2020   COVID-19 Vaccine (3 - 2023-24 season) 04/08/2022   Medicare Annual Wellness (AWV)  09/22/2023   COLONOSCOPY (Pts 45-60yr Insurance coverage will need to be confirmed)  12/07/2023   MAMMOGRAM  12/16/2023   INFLUENZA VACCINE  Completed   DEXA SCAN  Completed   HPV VACCINES  Aged Out   Hepatitis C Screening  Discontinued    Health Maintenance  Health Maintenance Due  Topic Date Due   DTaP/Tdap/Td (1 - Tdap) Never done   Zoster Vaccines- Shingrix (1 of 2) Never done   Pneumonia Vaccine 71 Years old (2 of 2 - PPSV23 or PCV20) 10/03/2020   COVID-19 Vaccine (3 - 2023-24 season) 04/08/2022    Colorectal cancer screening: Type of screening: Colonoscopy. Completed 03/2019. Repeat every 5 years  Mammogram status: Completed 12/15/2021. Repeat every year  Bone Density status: Completed 09/24/2021. Results reflect: Bone density results: OSTEOPOROSIS. Repeat every 2 years.  Lung Cancer Screening: (Low Dose CT Chest recommended if Age 352-80years, 30 pack-year currently smoking OR have quit w/in 15years.) does not qualify.   Lung Cancer Screening Referral: N/A  Additional Screening:  Hepatitis C Screening: does qualify; Completed N/A  Vision Screening: Recommended annual ophthalmology exams for early detection of glaucoma and other disorders of the eye. Is the  patient up to date with their annual eye exam?  Yes  Who is the provider or what is the name of the office in which the patient attends annual eye exams? [facility] If pt is not established with a provider, would they like to be referred to a provider to establish care? No .   Dental Screening: Recommended annual dental exams for proper oral hygiene  Community Resource Referral / Chronic Care Management: CRR required this visit?  No   CCM required this visit?  No      Plan:     I have personally reviewed and noted the following in the patient's chart:   Medical and social history Use of alcohol, tobacco or illicit drugs  Current medications and supplements including opioid prescriptions. Patient is not currently taking opioid prescriptions. Functional ability and status Nutritional status Physical activity Advanced directives List of other physicians Hospitalizations, surgeries, and ER visits in previous 12 months Vitals Screenings to include cognitive, depression, and falls Referrals and appointments  In addition, I have reviewed and discussed with patient certain preventive protocols, quality metrics, and best practice recommendations. A written personalized care plan for preventive services as well as  general preventive health recommendations were provided to patient.     Chriss Driver, LPN   [id number]   Nurse Notes: Discussed Prevnar 60, Shingirx and how to obtain. Pt is up to date on other health maintenance. Pt states Colonoscopy is due every 5 years and will be due August 2025.

## 2022-09-21 NOTE — Patient Instructions (Signed)
Tara Brennan , Thank you for taking time to come for your Medicare Wellness Visit. I appreciate your ongoing commitment to your health goals. Please review the following plan we discussed and let me know if I can assist you in the future.   These are the goals we discussed:  Goals      DIET - REDUCE CALORIE INTAKE     Weight loss.  09/21/22-Pt would like to continue to work on weight loss. Mjp,lpn        This is a list of the screening recommended for you and due dates:  Health Maintenance  Topic Date Due   DTaP/Tdap/Td vaccine (1 - Tdap) Never done   Zoster (Shingles) Vaccine (1 of 2) Never done   Pneumonia Vaccine (2 of 2 - PPSV23 or PCV20) 10/03/2020   COVID-19 Vaccine (3 - 2023-24 season) 04/08/2022   Medicare Annual Wellness Visit  09/22/2023   Colon Cancer Screening  12/07/2023   Mammogram  12/16/2023   Flu Shot  Completed   DEXA scan (bone density measurement)  Completed   HPV Vaccine  Aged Out   Hepatitis C Screening: USPSTF Recommendation to screen - Ages 18-79 yo.  Discontinued    Advanced directives: Please bring a copy of your health care power of attorney and living will to the office to be added to your chart at your convenience.   Conditions/risks identified: KEEP UP THE GOOD WORK!! Aim for 30 minutes of exercise or brisk walking, 6-8 glasses of water, and 5 servings of fruits and vegetables each day.   Next appointment: Follow up in one year for your annual wellness visit 09/23/2023.   Preventive Care 71 Years and Older, Female Preventive care refers to lifestyle choices and visits with your health care provider that can promote health and wellness. What does preventive care include? A yearly physical exam. This is also called an annual well check. Dental exams once or twice a year. Routine eye exams. Ask your health care provider how often you should have your eyes checked. Personal lifestyle choices, including: Daily care of your teeth and gums. Regular  physical activity. Eating a healthy diet. Avoiding tobacco and drug use. Limiting alcohol use. Practicing safe sex. Taking low-dose aspirin every day. Taking vitamin and mineral supplements as recommended by your health care provider. What happens during an annual well check? The services and screenings done by your health care provider during your annual well check will depend on your age, overall health, lifestyle risk factors, and family history of disease. Counseling  Your health care provider may ask you questions about your: Alcohol use. Tobacco use. Drug use. Emotional well-being. Home and relationship well-being. Sexual activity. Eating habits. History of falls. Memory and ability to understand (cognition). Work and work Statistician. Reproductive health. Screening  You may have the following tests or measurements: Height, weight, and BMI. Blood pressure. Lipid and cholesterol levels. These may be checked every 5 years, or more frequently if you are over 28 years old. Skin check. Lung cancer screening. You may have this screening every year starting at age 5 if you have a 30-pack-year history of smoking and currently smoke or have quit within the past 15 years. Fecal occult blood test (FOBT) of the stool. You may have this test every year starting at age 70. Flexible sigmoidoscopy or colonoscopy. You may have a sigmoidoscopy every 5 years or a colonoscopy every 10 years starting at age 54. Hepatitis C blood test. Hepatitis B blood test. Sexually transmitted  disease (STD) testing. Diabetes screening. This is done by checking your blood sugar (glucose) after you have not eaten for a while (fasting). You may have this done every 1-3 years. Bone density scan. This is done to screen for osteoporosis. You may have this done starting at age 44. Mammogram. This may be done every 1-2 years. Talk to your health care provider about how often you should have regular mammograms. Talk  with your health care provider about your test results, treatment options, and if necessary, the need for more tests. Vaccines  Your health care provider may recommend certain vaccines, such as: Influenza vaccine. This is recommended every year. Tetanus, diphtheria, and acellular pertussis (Tdap, Td) vaccine. You may need a Td booster every 10 years. Zoster vaccine. You may need this after age 53. Pneumococcal 13-valent conjugate (PCV13) vaccine. One dose is recommended after age 61. Pneumococcal polysaccharide (PPSV23) vaccine. One dose is recommended after age 58. Talk to your health care provider about which screenings and vaccines you need and how often you need them. This information is not intended to replace advice given to you by your health care provider. Make sure you discuss any questions you have with your health care provider. Document Released: 08/21/2015 Document Revised: 04/13/2016 Document Reviewed: 05/26/2015 Elsevier Interactive Patient Education  2017 [place] Prevention in the Home Falls can cause injuries. They can happen to people of all ages. There are many things you can do to make your home safe and to help prevent falls. What can I do on the outside of my home? Regularly fix the edges of walkways and driveways and fix any cracks. Remove anything that might make you trip as you walk through a door, such as a raised step or threshold. Trim any bushes or trees on the path to your home. Use bright outdoor lighting. Clear any walking paths of anything that might make someone trip, such as rocks or tools. Regularly check to see if handrails are loose or broken. Make sure that both sides of any steps have handrails. Any raised decks and porches should have guardrails on the edges. Have any leaves, snow, or ice cleared regularly. Use sand or salt on walking paths during winter. Clean up any spills in your garage right away. This includes oil or grease  spills. What can I do in the bathroom? Use night lights. Install grab bars by the toilet and in the tub and shower. Do not use towel bars as grab bars. Use non-skid mats or decals in the tub or shower. If you need to sit down in the shower, use a plastic, non-slip stool. Keep the floor dry. Clean up any water that spills on the floor as soon as it happens. Remove soap buildup in the tub or shower regularly. Attach bath mats securely with double-sided non-slip rug tape. Do not have throw rugs and other things on the floor that can make you trip. What can I do in the bedroom? Use night lights. Make sure that you have a light by your bed that is easy to reach. Do not use any sheets or blankets that are too big for your bed. They should not hang down onto the floor. Have a firm chair that has side arms. You can use this for support while you get dressed. Do not have throw rugs and other things on the floor that can make you trip. What can I do in the kitchen? Clean up any spills right away. Avoid walking  on wet floors. Keep items that you use a lot in easy-to-reach places. If you need to reach something above you, use a strong step stool that has a grab bar. Keep electrical cords out of the way. Do not use floor polish or wax that makes floors slippery. If you must use wax, use non-skid floor wax. Do not have throw rugs and other things on the floor that can make you trip. What can I do with my stairs? Do not leave any items on the stairs. Make sure that there are handrails on both sides of the stairs and use them. Fix handrails that are broken or loose. Make sure that handrails are as long as the stairways. Check any carpeting to make sure that it is firmly attached to the stairs. Fix any carpet that is loose or worn. Avoid having throw rugs at the top or bottom of the stairs. If you do have throw rugs, attach them to the floor with carpet tape. Make sure that you have a light switch at the  top of the stairs and the bottom of the stairs. If you do not have them, ask someone to add them for you. What else can I do to help prevent falls? Wear shoes that: Do not have high heels. Have rubber bottoms. Are comfortable and fit you well. Are closed at the toe. Do not wear sandals. If you use a stepladder: Make sure that it is fully opened. Do not climb a closed stepladder. Make sure that both sides of the stepladder are locked into place. Ask someone to hold it for you, if possible. Clearly mark and make sure that you can see: Any grab bars or handrails. First and last steps. Where the edge of each step is. Use tools that help you move around (mobility aids) if they are needed. These include: Canes. Walkers. Scooters. Crutches. Turn on the lights when you go into a dark area. Replace any light bulbs as soon as they burn out. Set up your furniture so you have a clear path. Avoid moving your furniture around. If any of your floors are uneven, fix them. If there are any pets around you, be aware of where they are. Review your medicines with your doctor. Some medicines can make you feel dizzy. This can increase your chance of falling. Ask your doctor what other things that you can do to help prevent falls. This information is not intended to replace advice given to you by your health care provider. Make sure you discuss any questions you have with your health care provider. Document Released: 05/21/2009 Document Revised: 12/31/2015 Document Reviewed: 08/29/2014 Elsevier Interactive Patient Education  2017 [employer].

## 2022-09-26 NOTE — Progress Notes (Signed)
PHONE VISIT. PT AT HOME. PROVIDER AT [facility].

## 2022-09-27 ENCOUNTER — Encounter: Payer: Self-pay | Admitting: Family Medicine

## 2022-09-27 ENCOUNTER — Other Ambulatory Visit: Payer: Self-pay

## 2022-09-29 NOTE — Progress Notes (Signed)
Cardiology Office Note  Date: 09/30/2022   ID: Tara, Brennan 1951/01/06, MRN [id number]  PCP:  Tara Frizzle, MD  Cardiologist:  Tara Lesches, MD Electrophysiologist:  None   Chief Complaint  Patient presents with   Cardiac follow-up    History of Present Illness: Tara Brennan is a 72 y.o. female last seen in January 2023 by Ms. Strader PA-C, I reviewed the note.  She is here for a routine visit.  Reports no palpitations since last encounter, no major change in status otherwise.  She has been compliant with her medications overall, no reported bleeding problems on Eliquis.  I reviewed her lab work from September.  ECG today shows normal sinus rhythm.  She is due for follow-up with her PCP for repeat lipids.  She has been focusing on diet management so far, wanting to avoid Lipitor which was originally recommended by Dr. Dennard Brennan with LDL 157.  Most recent LDL was down to 139.  10-year estimated CVD risk is approximately 15%.  Past Medical History:  Diagnosis Date   Anxiety    GERD (gastroesophageal reflux disease)    Osteopenia    Ovarian cyst    Paroxysmal atrial fibrillation [facility])    Diagnosed November 2020    Current Outpatient Medications  Medication Sig Dispense Refill   acetaminophen (TYLENOL) 500 MG tablet Take 500 mg by mouth every 6 (six) hours as needed for mild pain or moderate pain.     alendronate (FOSAMAX) 70 MG tablet Take 70 mg by mouth once a week. Take with a full glass of water on an empty stomach.     apixaban (ELIQUIS) 5 MG TABS tablet TAKE (1) TABLET BY MOUTH TWICE DAILY. 200 tablet 1   diltiazem (CARDIZEM CD) 180 MG 24 hr capsule TAKE (1) CAPSULE BY MOUTH ONCE DAILY. 90 capsule 2   omega-3 acid ethyl esters (LOVAZA) 1 g capsule Take 1,400 mg by mouth 2 (two) times daily. Over the counter, '700mg'$ --pt buys     pantoprazole (PROTONIX) 40 MG tablet TAKE (1) TABLET BY MOUTH ONCE DAILY. 100 tablet 3   Vitamin E 400 units TABS Take 400  Units by mouth daily.     No current facility-administered medications for this visit.   Allergies:  Patient has no known allergies.   ROS: No dizziness or syncope.  Physical Exam: VS:  BP 138/82   Pulse 68   Ht 5' 7.5" (1.715 m)   Wt 182 lb 9.6 oz (82.8 kg)   SpO2 97%   BMI 28.18 kg/m , BMI Body mass index is 28.18 kg/m.  Wt Readings from Last 3 Encounters:  09/30/22 182 lb 9.6 oz (82.8 kg)  09/21/22 181 lb (82.1 kg)  04/14/22 187 lb (84.8 kg)    General: Patient appears comfortable at rest. HEENT: Conjunctiva and lids normal. Neck: Supple, no elevated JVP or carotid bruits. Lungs: Clear to auscultation, nonlabored breathing at rest. Cardiac: Regular rate and rhythm, no S3 or significant systolic murmur, no pericardial rub. Extremities: No pitting edema.  ECG:  An ECG dated 07/09/2020 was personally reviewed today and demonstrated:  Sinus rhythm.  Recent Labwork: 04/08/2022: ALT 18; AST 21; BUN 22; Creat 0.79; Hemoglobin 13.3; Platelets 283; Potassium 5.1; Sodium 141     Component Value Date/Time   CHOL 217 (H) 04/08/2022 1009   TRIG 95 04/08/2022 1009   HDL 58 04/08/2022 1009   CHOLHDL 3.7 04/08/2022 1009   LDLCALC 139 (H) 04/08/2022 1009    Other  Studies Reviewed Today:  Echocardiogram 06/23/2019:  1. Left ventricular ejection fraction, by visual estimation, is 60 to  65%. The left ventricle has normal function. There is mildly increased  left ventricular hypertrophy.   2. Left ventricular diastolic function could not be evaluated.   3. The left ventricle has no regional wall motion abnormalities.   4. Global right ventricle has normal systolic function.The right  ventricular size is normal. No increase in right ventricular wall  thickness.   5. Left atrial size was normal.   6. Right atrial size was normal.   7. The mitral valve is grossly normal. Mild mitral valve regurgitation.   8. The tricuspid valve is grossly normal. Tricuspid valve regurgitation  is  trivial.   9. The aortic valve is tricuspid. Aortic valve regurgitation is not  visualized. No evidence of aortic valve sclerosis or stenosis.  10. The pulmonic valve was grossly normal. Pulmonic valve regurgitation is  not visualized.  11. The inferior vena cava is normal in size with greater than 50%  respiratory variability, suggesting right atrial pressure of 3 mmHg.   Assessment and Plan:  1.  Paroxysmal atrial fibrillation with CHA2DS2-VASc score of 3.  She is symptomatically stable with no recurrent palpitations on Cardizem CD and remains on Eliquis for stroke prophylaxis.  Reports no spontaneous bleeding problems.  I reviewed her interval lab work.  ECG today shows normal sinus rhythm.  No changes were made.  2.  Mixed hyperlipidemia, most recent LDL 139.  She has been taking omega-3 supplements, hesitant to start Lipitor which was recommended previously by Dr. Dennard Brennan when her LDL was 157.  Even with her interval LDL reduction, estimated 10-year CVD risk is approximately 15%.  I did talk with her about this again today.  She has concerns about possible side effects, consider starting either low-dose Lipitor or Crestor depending on her repeat FLP and then see how she tolerates it with up titration as needed for LDL control.  Medication Adjustments/Labs and Tests Ordered: Current medicines are reviewed at length with the patient today.  Concerns regarding medicines are outlined above.   Tests Ordered: Orders Placed This Encounter  Procedures   EKG 12-Lead    Medication Changes: No orders of the defined types were placed in this encounter.   Disposition:  Follow up  1 year.  Signed, Satira Sark, MD, [facility] 09/30/2022 8:38 AM    [place] [facility] [address] [place], [place] [address] Phone: [phone]; Fax: [phone]

## 2022-09-30 ENCOUNTER — Ambulatory Visit: Payer: PPO | Attending: Cardiology | Admitting: Cardiology

## 2022-09-30 ENCOUNTER — Encounter: Payer: Self-pay | Admitting: Cardiology

## 2022-09-30 VITALS — BP 138/82 | HR 68 | Ht 67.5 in | Wt 182.6 lb

## 2022-09-30 DIAGNOSIS — I480 Paroxysmal atrial fibrillation: Secondary | ICD-10-CM | POA: Diagnosis not present

## 2022-09-30 DIAGNOSIS — E782 Mixed hyperlipidemia: Secondary | ICD-10-CM

## 2022-09-30 NOTE — Patient Instructions (Signed)
Medication Instructions:  Your physician recommends that you continue on your current medications as directed. Please refer to the Current Medication list given to you today.   Labwork: None today  Testing/Procedures: None today  Follow-Up: 1 year Dr.McDowell  Any Other Special Instructions Will Be Listed Below (If Applicable).  If you need a refill on your cardiac medications before your next appointment, please call your pharmacy.

## 2022-10-01 ENCOUNTER — Encounter: Payer: Self-pay | Admitting: Family Medicine

## 2022-10-01 ENCOUNTER — Encounter: Payer: Self-pay | Admitting: Cardiology

## 2022-10-03 MED ORDER — DILTIAZEM HCL ER COATED BEADS 180 MG PO CP24: ORAL_CAPSULE | ORAL | 2 refills | Status: DC

## 2022-10-18 ENCOUNTER — Ambulatory Visit: Payer: PPO | Admitting: Family Medicine

## 2022-10-24 ENCOUNTER — Other Ambulatory Visit (HOSPITAL_COMMUNITY)
Admission: RE | Admit: 2022-10-24 | Discharge: 2022-10-24 | Disposition: A | Discharge status: Home / Self Care | Payer: PPO | Source: Ambulatory Visit | Attending: Family Medicine | Admitting: Family Medicine

## 2022-10-24 ENCOUNTER — Encounter: Payer: Self-pay | Admitting: Family Medicine

## 2022-10-24 ENCOUNTER — Ambulatory Visit (INDEPENDENT_AMBULATORY_CARE_PROVIDER_SITE_OTHER): Payer: PPO | Admitting: Family Medicine

## 2022-10-24 VITALS — BP 122/74 | HR 67 | Temp 98.4°F | Ht 67.5 in | Wt 185.0 lb

## 2022-10-24 DIAGNOSIS — E7800 Pure hypercholesterolemia, unspecified: Secondary | ICD-10-CM | POA: Diagnosis not present

## 2022-10-24 LAB — CBC WITH DIFFERENTIAL/PLATELET
Abs Immature Granulocytes: 0.01 10*3/uL (ref 0.00–0.07)
Basophils Absolute: 0 10*3/uL (ref 0.0–0.1)
Basophils Relative: 1 %
Eosinophils Absolute: 0.2 10*3/uL (ref 0.0–0.5)
Eosinophils Relative: 3 %
HCT: 37.6 % (ref 36.0–46.0)
Hemoglobin: 12.3 g/dL (ref 12.0–15.0)
Immature Granulocytes: 0 %
Lymphocytes Relative: 24 %
Lymphs Abs: 1.4 10*3/uL (ref 0.7–4.0)
MCH: 28.2 pg (ref 26.0–34.0)
MCHC: 32.7 g/dL (ref 30.0–36.0)
MCV: 86.2 fL (ref 80.0–100.0)
Monocytes Absolute: 0.4 10*3/uL (ref 0.1–1.0)
Monocytes Relative: 8 %
Neutro Abs: 3.6 10*3/uL (ref 1.7–7.7)
Neutrophils Relative %: 64 %
Platelets: 274 10*3/uL (ref 150–400)
RBC: 4.36 MIL/uL (ref 3.87–5.11)
RDW: 13.1 % (ref 11.5–15.5)
WBC: 5.6 10*3/uL (ref 4.0–10.5)
nRBC: 0 % (ref 0.0–0.2)

## 2022-10-24 LAB — COMPREHENSIVE METABOLIC PANEL
ALT: 25 U/L (ref 0–44)
AST: 31 U/L (ref 15–41)
Albumin: 4.1 g/dL (ref 3.5–5.0)
Alkaline Phosphatase: 72 U/L (ref 38–126)
Anion gap: 8 (ref 5–15)
BUN: 22 mg/dL (ref 8–23)
CO2: 28 mmol/L (ref 22–32)
Calcium: 9.3 mg/dL (ref 8.9–10.3)
Chloride: 103 mmol/L (ref 98–111)
Creatinine, Ser: 0.74 mg/dL (ref 0.44–1.00)
GFR, Estimated: >60 mL/min (ref >60)
Glucose, Bld: 105 mg/dL — ABNORMAL HIGH (ref 70–99)
Potassium: 4.6 mmol/L (ref 3.5–5.1)
Sodium: 139 mmol/L (ref 135–145)
Total Bilirubin: 0.8 mg/dL (ref 0.3–1.2)
Total Protein: 7.4 g/dL (ref 6.5–8.1)

## 2022-10-24 LAB — LIPID PANEL
Cholesterol: 218 mg/dL — ABNORMAL HIGH (ref 0–200)
HDL: 63 mg/dL (ref >40)
LDL Cholesterol: 144 mg/dL — ABNORMAL HIGH (ref 0–99)
Total CHOL/HDL Ratio: 3.5 RATIO
Triglycerides: 55 mg/dL (ref <150)
VLDL: 11 mg/dL (ref 0–40)

## 2022-10-24 MED ORDER — ROSUVASTATIN CALCIUM 5 MG PO TABS: 5.0000 mg | ORAL_TABLET | Freq: Every day | ORAL | 3 refills | Status: DC

## 2022-10-24 NOTE — Progress Notes (Signed)
Subjective:    Patient ID: Tara Brennan, female    DOB: 01-Mar-1951, 72 y.o.   MRN: [id number]  HPI  Patient is a very pleasant 72 year old Caucasian female who presents today for a checkup.  Nov 2020, she had an episode of atrial fibrillation.  She is appropriately anticoagulated with Eliquis.  Today she is in normal sinus rhythm.  She does take Cardizem for rate control.  On her last lab work in September her cholesterol is elevated.  She is taking fish oil but is not currently taking a statin.  Her cardiologist recommended that she start a statin. Past Medical History:  Diagnosis Date   Anxiety    GERD (gastroesophageal reflux disease)    Osteopenia    Ovarian cyst    Paroxysmal atrial fibrillation [facility])    Diagnosed November 2020     Past Surgical History:  Procedure Laterality Date   ABDOMINAL HYSTERECTOMY  1984   ESOPHAGOGASTRODUODENOSCOPY  06/28/2012   Procedure: ESOPHAGOGASTRODUODENOSCOPY (EGD);  Surgeon: Jeryl Columbia, MD;  Location: [facility] ENDOSCOPY;  Service: Endoscopy;  Laterality: N/A;   OVARIAN CYST SURGERY     PARTIAL HYSTERECTOMY     Current Outpatient Medications on File Prior to Visit  Medication Sig Dispense Refill   acetaminophen (TYLENOL) 500 MG tablet Take 500 mg by mouth every 6 (six) hours as needed for mild pain or moderate pain.     alendronate (FOSAMAX) 70 MG tablet Take 70 mg by mouth once a week. Take with a full glass of water on an empty stomach.     apixaban (ELIQUIS) 5 MG TABS tablet TAKE (1) TABLET BY MOUTH TWICE DAILY. 200 tablet 1   diltiazem (CARDIZEM CD) 180 MG 24 hr capsule TAKE (1) CAPSULE BY MOUTH ONCE DAILY. 100 capsule 2   omega-3 acid ethyl esters (LOVAZA) 1 g capsule Take 1,400 mg by mouth 2 (two) times daily. Over the counter, 700mg --pt buys     pantoprazole (PROTONIX) 40 MG tablet TAKE (1) TABLET BY MOUTH ONCE DAILY. 100 tablet 3   Vitamin E 400 units TABS Take 400 Units by mouth daily.     No current facility-administered medications  on file prior to visit.   No Known Allergies Social History   Socioeconomic History   Marital status: Married    Spouse name: Sonia Side   Number of children: 2   Years of education: Not on file   Highest education level: Not on file  Occupational History   Not on file  Tobacco Use   Smoking status: Never   Smokeless tobacco: Never  Vaping Use   Vaping Use: Never used  Substance and Sexual Activity   Alcohol use: No   Drug use: No   Sexual activity: Yes    Birth control/protection: Surgical  Other Topics Concern   Not on file  Social History Narrative   ** Merged History Encounter **    2 sons   3 grand children    Social Determinants of Health   Financial Resource Strain: Low Risk  (09/21/2022)   Overall Financial Resource Strain (CARDIA)    Difficulty of Paying Living Expenses: Not hard at all  Food Insecurity: No Food Insecurity (09/21/2022)   Hunger Vital Sign    Worried About Running Out of Food in the Last Year: Never true    Ran Out of Food in the Last Year: Never true  Transportation Needs: No Transportation Needs (09/21/2022)   PRAPARE - Transportation    Lack of Transportation (  Medical): No    Lack of Transportation (Non-Medical): No  Physical Activity: Insufficiently Active (09/21/2022)   Exercise Vital Sign    Days of Exercise per Week: 4 days    Minutes of Exercise per Session: 30 min  Stress: No Stress Concern Present (09/21/2022)   [place]    Feeling of Stress : Not at all  Social Connections: [name] (09/21/2022)   Social Connection and Isolation Panel [NHANES]    Frequency of Communication with Friends and Family: More than three times a week    Frequency of Social Gatherings with Friends and Family: More than three times a week    Attends Religious Services: More than 4 times per year    Active Member of [employer] or Organizations: Yes    Attends Archivist Meetings:  More than 4 times per year    Marital Status: Married  Human resources officer Violence: Not At Risk (09/21/2022)   Humiliation, Afraid, Rape, and Kick questionnaire    Fear of Current or Ex-Partner: No    Emotionally Abused: No    Physically Abused: No    Sexually Abused: No    Review of Systems     Objective:   Physical Exam Vitals reviewed.  Constitutional:      Appearance: She is normal weight.  Cardiovascular:     Rate and Rhythm: Normal rate and regular rhythm.     Pulses: Normal pulses.     Heart sounds: Normal heart sounds. No murmur heard.    No friction rub. No gallop.  Pulmonary:     Effort: Pulmonary effort is normal. No respiratory distress.     Breath sounds: Normal breath sounds. No wheezing, rhonchi or rales.  Abdominal:     General: Abdomen is flat. Bowel sounds are normal. There is no distension.     Palpations: Abdomen is soft. There is no mass.     Tenderness: There is no abdominal tenderness. There is no guarding.     Hernia: No hernia is present.  Musculoskeletal:     Right lower leg: No edema.     Left lower leg: No edema.  Neurological:     Mental Status: She is alert.           Assessment & Plan:  Pure hypercholesterolemia - Plan: CBC with Differential/Platelet, Lipid panel, COMPLETE METABOLIC PANEL WITH GFR I agree I would like to see her LDL cholesterol lower.  Last time was 139.  Recheck a CMP and lipid panel today.  If the LDL cholesterol is greater than 100 would recommend starting Crestor 5 mg a day.  Monitor CBC to evaluate for any anemia on Eliquis

## 2022-10-26 ENCOUNTER — Encounter: Payer: Self-pay | Admitting: Family Medicine

## 2022-10-27 ENCOUNTER — Other Ambulatory Visit: Payer: Self-pay

## 2022-10-27 DIAGNOSIS — E7800 Pure hypercholesterolemia, unspecified: Secondary | ICD-10-CM

## 2022-10-27 MED ORDER — ROSUVASTATIN CALCIUM 5 MG PO TABS: 5.0000 mg | ORAL_TABLET | Freq: Every day | ORAL | 1 refills | Status: DC

## 2022-11-04 ENCOUNTER — Other Ambulatory Visit (HOSPITAL_COMMUNITY): Payer: Self-pay | Admitting: Obstetrics & Gynecology

## 2022-11-04 DIAGNOSIS — Z1231 Encounter for screening mammogram for malignant neoplasm of breast: Secondary | ICD-10-CM

## 2022-12-19 ENCOUNTER — Encounter (HOSPITAL_COMMUNITY): Payer: Self-pay

## 2022-12-19 ENCOUNTER — Ambulatory Visit (HOSPITAL_COMMUNITY)
Admission: RE | Admit: 2022-12-19 | Discharge: 2022-12-19 | Disposition: A | Discharge status: Home / Self Care | Payer: PPO | Source: Ambulatory Visit | Attending: Obstetrics & Gynecology | Admitting: Obstetrics & Gynecology

## 2022-12-19 DIAGNOSIS — Z1231 Encounter for screening mammogram for malignant neoplasm of breast: Secondary | ICD-10-CM | POA: Insufficient documentation

## 2022-12-19 LAB — HM MAMMOGRAPHY

## 2022-12-20 DIAGNOSIS — H5203 Hypermetropia, bilateral: Secondary | ICD-10-CM | POA: Diagnosis not present

## 2022-12-20 DIAGNOSIS — H2513 Age-related nuclear cataract, bilateral: Secondary | ICD-10-CM | POA: Diagnosis not present

## 2022-12-20 DIAGNOSIS — H524 Presbyopia: Secondary | ICD-10-CM | POA: Diagnosis not present

## 2022-12-20 DIAGNOSIS — H43813 Vitreous degeneration, bilateral: Secondary | ICD-10-CM | POA: Diagnosis not present

## 2023-01-05 ENCOUNTER — Other Ambulatory Visit: Payer: Self-pay

## 2023-01-05 ENCOUNTER — Encounter: Payer: Self-pay | Admitting: Family Medicine

## 2023-01-05 DIAGNOSIS — E7800 Pure hypercholesterolemia, unspecified: Secondary | ICD-10-CM

## 2023-01-05 DIAGNOSIS — I4891 Unspecified atrial fibrillation: Secondary | ICD-10-CM

## 2023-01-16 DIAGNOSIS — Z01419 Encounter for gynecological examination (general) (routine) without abnormal findings: Secondary | ICD-10-CM | POA: Diagnosis not present

## 2023-01-16 DIAGNOSIS — Z6829 Body mass index (BMI) 29.0-29.9, adult: Secondary | ICD-10-CM | POA: Diagnosis not present

## 2023-01-26 DIAGNOSIS — D485 Neoplasm of uncertain behavior of skin: Secondary | ICD-10-CM | POA: Diagnosis not present

## 2023-01-26 DIAGNOSIS — D225 Melanocytic nevi of trunk: Secondary | ICD-10-CM | POA: Diagnosis not present

## 2023-01-26 DIAGNOSIS — L905 Scar conditions and fibrosis of skin: Secondary | ICD-10-CM | POA: Diagnosis not present

## 2023-01-26 DIAGNOSIS — D2261 Melanocytic nevi of right upper limb, including shoulder: Secondary | ICD-10-CM | POA: Diagnosis not present

## 2023-01-26 DIAGNOSIS — D2262 Melanocytic nevi of left upper limb, including shoulder: Secondary | ICD-10-CM | POA: Diagnosis not present

## 2023-01-26 DIAGNOSIS — L298 Other pruritus: Secondary | ICD-10-CM | POA: Diagnosis not present

## 2023-02-08 DIAGNOSIS — D485 Neoplasm of uncertain behavior of skin: Secondary | ICD-10-CM | POA: Diagnosis not present

## 2023-02-08 DIAGNOSIS — L988 Other specified disorders of the skin and subcutaneous tissue: Secondary | ICD-10-CM | POA: Diagnosis not present

## 2023-02-08 DIAGNOSIS — L820 Inflamed seborrheic keratosis: Secondary | ICD-10-CM | POA: Diagnosis not present

## 2023-02-08 DIAGNOSIS — D2272 Melanocytic nevi of left lower limb, including hip: Secondary | ICD-10-CM | POA: Diagnosis not present

## 2023-02-13 ENCOUNTER — Encounter: Payer: Self-pay | Admitting: Family Medicine

## 2023-02-13 ENCOUNTER — Other Ambulatory Visit: Payer: Self-pay

## 2023-02-13 DIAGNOSIS — I480 Paroxysmal atrial fibrillation: Secondary | ICD-10-CM

## 2023-02-13 MED ORDER — APIXABAN 5 MG PO TABS: ORAL_TABLET | ORAL | 1 refills | Status: DC

## 2023-02-25 ENCOUNTER — Other Ambulatory Visit: Payer: Self-pay | Admitting: Family Medicine

## 2023-02-27 NOTE — Telephone Encounter (Signed)
Requested medication (s) are due for refill today: routing for review  Requested medication (s) are on the active medication list: yes  Last refill:  09/30/22  Future visit scheduled: no  Notes to clinic:  Unable to refill per protocol, last refill by historical provider.      Requested Prescriptions  Pending Prescriptions Disp Refills   alendronate (FOSAMAX) 70 MG tablet [Pharmacy Med Name: ALENDRONATE SODIUM 70 MG TAB] 12 tablet 0    Sig: TAKE 1 TABLET EVERY WEEK INTHE MORNING 30 MIN BEFORE EATING WITH AN 8OZ GLASS OF WATER (SIT UP 30 MIN)     Endocrinology:  Bisphosphonates Failed - 02/25/2023  9:10 AM      Failed - Vitamin D in normal range and within 360 days    Vit D, 25-Hydroxy  Date Value Ref Range Status  04/03/2020 57 30 - 100 ng/mL Final    Comment:    Vitamin D Status         25-OH Vitamin D: . Deficiency:                    <20 ng/mL Insufficiency:             20 - 29 ng/mL Optimal:                 > or = 30 ng/mL . For 25-OH Vitamin D testing on patients on  D2-supplementation and patients for whom quantitation  of D2 and D3 fractions is required, the [facility](TM) 25-OH VIT D, (D2,D3), LC/MS/MS is recommended: order  code 01027 (patients >51yrs). See Note 1 . Note 1 . For additional information, please refer to  http://education.Quest[facility].com/faq/FAQ199  (This link is being provided for informational/ educational purposes only.)          Failed - Mg Level in normal range and within 360 days    Magnesium  Date Value Ref Range Status  06/23/2019 2.3 1.7 - 2.4 mg/dL Final    Comment:    Performed at [facility], [address] [place], [place] [address]         Failed - Phosphate in normal range and within 360 days    No results found for: "PHOS"       Failed - Valid encounter within last 12 months    Recent Outpatient Visits           1 year ago Paroxysmal atrial fibrillation (HCC)   [facility] Pickard, Priscille Heidelberg, MD    1 year ago Paroxysmal atrial fibrillation (HCC)   [facility] Pickard, Priscille Heidelberg, MD   2 years ago Thyroid nodule   [facility] Tanya Nones, Priscille Heidelberg, MD   2 years ago LLQ abdominal pain   [facility] Valentino Nose, NP   2 years ago Rib pain   [facility] Tanya Nones, Priscille Heidelberg, MD              Passed - Ca in normal range and within 360 days    Calcium  Date Value Ref Range Status  10/24/2022 9.3 8.9 - 10.3 mg/dL Final         Passed - Cr in normal range and within 360 days    Creat  Date Value Ref Range Status  04/08/2022 0.79 0.60 - 1.00 mg/dL Final   Creatinine, Ser  Date Value Ref Range Status  10/24/2022 0.74 0.44 - 1.00 mg/dL Final  Passed - eGFR is 30 or above and within 360 days    GFR, Est African American  Date Value Ref Range Status  10/20/2020 88 > OR = 60 mL/min/1.31m2 Final   GFR, Est Non African American  Date Value Ref Range Status  10/20/2020 76 > OR = 60 mL/min/1.54m2 Final   GFR, Estimated  Date Value Ref Range Status  10/24/2022 >60 >60 mL/min Final    Comment:    (NOTE) Calculated using the CKD-EPI Creatinine Equation (2021)    eGFR  Date Value Ref Range Status  04/08/2022 80 > OR = 60 mL/min/1.86m2 Final         Passed - Bone Mineral Density or Dexa Scan completed in the last 2 years

## 2023-03-09 ENCOUNTER — Encounter: Payer: PPO | Attending: Family Medicine | Admitting: Nutrition

## 2023-03-09 VITALS — Ht 67.5 in | Wt 188.0 lb

## 2023-03-09 DIAGNOSIS — E663 Overweight: Secondary | ICD-10-CM | POA: Insufficient documentation

## 2023-03-09 DIAGNOSIS — E782 Mixed hyperlipidemia: Secondary | ICD-10-CM | POA: Insufficient documentation

## 2023-03-09 NOTE — Patient Instructions (Addendum)
Goals Increase more whole plant based foods with all meals Cut out dairy, red meat, pork and processed foods Get in 25 grams of fiber  daily/ Get TCHOL less than 200, LDL Less than 70  Cut out commercialized products

## 2023-03-09 NOTE — Progress Notes (Signed)
Medical Nutrition Therapy  Appointment Start time:  0800  Appointment End time:  0900  Primary concerns today: HYperlipidemia,   Referral diagnosis: Hyperlipidemia Preferred learning style: No preference Learning readiness: Ready    NUTRITION ASSESSMENT  72 yr old wfemale referred for Hyperlipidemia. PCP Dr. Tanya Nones PMH: AFIB, Hyperlipidemia, Cares for grandkids most days of the week. Walks some for exercise. Tends to eat a lof of commerlialized quick fast foods due to being on the run.  Family grows garden. They raise beef and diet tends to have more saturated fat sources in it.  Hyperlipidemia: TCHOL 218, LDL 144, Non HDL 159, HDL 63. She is at higher risk for cardiovascular event due to elevated lipids,AFIB, overweight, poor sleep and diet low in whole plant based foods. She is on cholesterol and Afib medications.  Fasting blood sugars are elevated. She is at risk for prediabetes. Recommend to check an A1C.  Poor sleep. May benefit from a sleep study. Discussed better sleep habits and ways to relax to prepare for better sleep at night.  She is willing to work on Lifestyle Medicine with incorporating more whole plant base foods, working on better sleep, increased physical activity and cutting out processed foods.  Anthropometrics  Wt Readings from Last 3 Encounters:  10/24/22 185 lb (83.9 kg)  09/30/22 182 lb 9.6 oz (82.8 kg)  09/21/22 181 lb (82.1 kg)   Ht Readings from Last 3 Encounters:  10/24/22 5' 7.5" (1.715 m)  09/30/22 5' 7.5" (1.715 m)  09/21/22 5' 7.5" (1.715 m)   There is no height or weight on file to calculate BMI. @BMIFA @ Facility age limit for growth %iles is 20 years. Facility age limit for growth %iles is 20 years.    Clinical Medical Hx: See chart Medications: See chart Labs:     Latest Ref Rng & Units 10/24/2022    9:37 AM 04/08/2022   10:09 AM 10/01/2021    8:14 AM  CMP  Glucose 70 - 99 mg/dL 829  562  93   BUN 8 - 23 mg/dL 22  22  19     Creatinine 0.44 - 1.00 mg/dL 1.30  8.65  7.84   Sodium 135 - 145 mmol/L 139  141  140   Potassium 3.5 - 5.1 mmol/L 4.6  5.1  4.9   Chloride 98 - 111 mmol/L 103  105  103   CO2 22 - 32 mmol/L 28  28  31    Calcium 8.9 - 10.3 mg/dL 9.3  9.9  69.6   Total Protein 6.5 - 8.1 g/dL 7.4  6.9  6.9   Total Bilirubin 0.3 - 1.2 mg/dL 0.8  0.6  0.7   Alkaline Phos 38 - 126 U/L 72     AST 15 - 41 U/L 31  21  20    ALT 0 - 44 U/L 25  18  18     Lipid Panel     Component Value Date/Time   CHOL 218 (H) 10/24/2022 0934   TRIG 55 10/24/2022 0934   HDL 63 10/24/2022 0934   CHOLHDL 3.5 10/24/2022 0934   VLDL 11 10/24/2022 0934   LDLCALC 144 (H) 10/24/2022 0934   LDLCALC 139 (H) 04/08/2022 1009    Notable Signs/Symptoms: none  Lifestyle & Dietary Hx Married and her husband does most of the cooking.  Estimated daily fluid intake: 64 oz Supplements: VIt E,Calcium Sleep: poor  Stress / self-care: No issues Current average weekly physical activity: Walks 30 minutes 3 times per week. Cares  for grandkids.  24-Hr Dietary Recall First Meal: Quick, fat free milk,, Strawberry Protein shake Snack: fiber one bar Second Meal: tuna and saltine crackers and fruit cup SF, water Snack: special K bar, light and lively yogurt Third Meal: 5 pm Core Power Shake,  745 pm Hot dog with bun, chili, slaw and mustard, water Snack: peach ice cream, 1/2 c Beverages: water  Estimated Energy Needs Calories: 1400 Carbohydrate: 158g Protein: 105g Fat: 39g   NUTRITION DIAGNOSIS  NI-5.6.3 Inappropriate intake of fats (specify): Cholesterol and saturated fats As related to Hyperlipidemia.  As evidenced by eating red meat, dairy products, processed and communalized food products..   NUTRITION INTERVENTION  Nutrition education (E-1) on the following topics:  Nutrition and Pre-Diabetes education provided on My Plate, CHO counting, meal planning, portion sizes, timing of meals, , benefits of exercising 30 minutes per day  and prevention of DM.   Lifestyle Medicine  - Whole Food, Plant Predominant Nutrition is highly recommended: Eat Plenty of vegetables, Mushrooms, fruits, Legumes, Whole Grains, Nuts, seeds in lieu of processed meats, processed snacks/pastries red meat, poultry, eggs.    -It is better to avoid simple carbohydrates including: Cakes, Sweet Desserts, Ice Cream, Soda (diet and regular), Sweet Tea, Candies, Chips, Cookies, Store Bought Juices, Alcohol in Excess of  1-2 drinks a day, Lemonade,  Artificial Sweeteners, Doughnuts, Coffee Creamers, "Sugar-free" Products, etc, etc.  This is not a complete list.....  Exercise: If you are able: 30 -60 minutes a day ,4 days a week, or 150 minutes a week.  The longer the better.  Combine stretch, strength, and aerobic activities.  If you were told in the past that you have high risk for cardiovascular diseases, you may seek evaluation by your heart doctor prior to initiating moderate to intense exercise programs.   Nutrition Therapy for Hyperlipidemia.   Handouts Provided Include  Lifestyle Medicine handouts Hyperlipidemia handout  Learning Style & Readiness for Change Teaching method utilized: Visual & Auditory  Demonstrated degree of understanding via: Teach Back  Barriers to learning/adherence to lifestyle change: none  Goals Established by Pt Goals Increase more whole plant based foods with all meals Cut out dairy, red meat, pork and processed foods Get in 25 grams of fiber  daily/ Get TCHOL less than 200, LDL Less than 70  Cut out commercialized products   MONITORING & EVALUATION Dietary intake, weekly physical activity, and cholesterol  in 3-4 months.  Next Steps  Patient is to work on eating more whole plant based foods .

## 2023-04-22 ENCOUNTER — Encounter: Payer: Self-pay | Admitting: Emergency Medicine

## 2023-04-22 ENCOUNTER — Ambulatory Visit
Admission: EM | Admit: 2023-04-22 | Discharge: 2023-04-22 | Disposition: A | Discharge status: Home / Self Care | Payer: PPO | Attending: Nurse Practitioner | Admitting: Nurse Practitioner

## 2023-04-22 DIAGNOSIS — Z1152 Encounter for screening for COVID-19: Secondary | ICD-10-CM | POA: Diagnosis not present

## 2023-04-22 DIAGNOSIS — B349 Viral infection, unspecified: Secondary | ICD-10-CM | POA: Diagnosis not present

## 2023-04-22 LAB — POCT INFLUENZA A/B
Influenza A, POC: NEGATIVE
Influenza B, POC: NEGATIVE

## 2023-04-22 MED ORDER — FLUTICASONE PROPIONATE 50 MCG/ACT NA SUSP: 2.0000 | Freq: Every day | NASAL | 0 refills | Status: DC

## 2023-04-22 NOTE — ED Triage Notes (Signed)
Sneezing, runny nose and cough with body aches and headache that started last night.  Fever started this morning.

## 2023-04-22 NOTE — ED Provider Notes (Signed)
[facility]    CSN: [id number] Arrival date & time: 04/22/23  0803      History   Chief Complaint No chief complaint on file.   HPI Tara Brennan is a 72 y.o. female.   The history is provided by the patient.   Patient presents for complaints of fatigue, fever, chills, body aches, sneezing, runny nose, and headache.  Patient also states she has a mild cough.  Symptoms started over the past 24 hours.  Tmax this morning 101.  She denies ear pain, ear drainage, wheezing, difficulty breathing, chest pain, abdominal pain, diarrhea or vomiting.  Patient states that she did have some nausea this morning, but she has been eating and drinking.  Patient denies any obvious known sick contacts.  Reports she took Tylenol for her symptoms.  Past Medical History:  Diagnosis Date   Anxiety    GERD (gastroesophageal reflux disease)    Osteopenia    Ovarian cyst    Paroxysmal atrial fibrillation [facility])    Diagnosed November 2020    Patient Active Problem List   Diagnosis Date Noted   Chest pain due to myocardial ischemia 09/21/2022   Family hx colonic polyps 09/21/2022   Left lower quadrant pain 09/21/2022   Diverticulitis 11/30/2020   Abnormal findings on diagnostic imaging of other parts of digestive tract 11/30/2020   Dysphagia 11/30/2020   Esophageal reflux 11/30/2020   Atrial fibrillation with RVR (HCC) 06/23/2019   Pneumothorax, right 11/30/2017   Osteoporosis 10/05/2015   Pain in toe 04/04/2013   Patellofemoral arthritis 12/07/2011   OA (osteoarthritis) of knee 12/07/2011   Knee pain 12/07/2011   KNEE, ARTHRITIS, DEGEN./OSTEO 09/14/2009    Past Surgical History:  Procedure Laterality Date   ABDOMINAL HYSTERECTOMY  1984   ESOPHAGOGASTRODUODENOSCOPY  06/28/2012   Procedure: ESOPHAGOGASTRODUODENOSCOPY (EGD);  Surgeon: Petra Kuba, MD;  Location: [facility] ENDOSCOPY;  Service: Endoscopy;  Laterality: N/A;   OVARIAN CYST SURGERY     PARTIAL HYSTERECTOMY      OB  History     Gravida  3   Para  2   Term  0   Preterm  2   AB  1   Living         SAB  1   IAB  0   Ectopic  0   Multiple      Live Births               Home Medications    Prior to Admission medications   Medication Sig Start Date End Date Taking? Authorizing Provider  acetaminophen (TYLENOL) 500 MG tablet Take 500 mg by mouth every 6 (six) hours as needed for mild pain or moderate pain.    [provider]  alendronate (FOSAMAX) 70 MG tablet TAKE 1 TABLET EVERY WEEK INTHE MORNING 30 MIN BEFORE EATING WITH AN 8OZ GLASS OF WATER (SIT UP 30 MIN) 02/27/23   Donita Brooks, MD  apixaban (ELIQUIS) 5 MG TABS tablet TAKE (1) TABLET BY MOUTH TWICE DAILY. 02/13/23   Donita Brooks, MD  diltiazem (CARDIZEM CD) 180 MG 24 hr capsule TAKE (1) CAPSULE BY MOUTH ONCE DAILY. 10/03/22   Jonelle Sidle, MD  omega-3 acid ethyl esters (LOVAZA) 1 g capsule Take 1,400 mg by mouth 2 (two) times daily.    [provider]  pantoprazole (PROTONIX) 40 MG tablet TAKE (1) TABLET BY MOUTH ONCE DAILY. 08/09/22   Donita Brooks, MD  rosuvastatin (CRESTOR) 5 MG tablet  Take 1 tablet (5 mg total) by mouth daily. 10/27/22   Donita Brooks, MD  Vitamin E 400 units TABS Take 400 Units by mouth daily.    [provider]    Family History Family History  Problem Relation Age of Onset   Hypertension Mother    Cancer Father    Diabetes Sister    Arthritis Other     Social History Social History   Tobacco Use   Smoking status: Never   Smokeless tobacco: Never  Vaping Use   Vaping status: Never Used  Substance Use Topics   Alcohol use: No   Drug use: No     Allergies   Patient has no known allergies.   Review of Systems Review of Systems Per HPI  Physical Exam Triage Vital Signs ED Triage Vitals  Encounter Vitals Group     BP 04/22/23 0813 131/76     Systolic BP Percentile --      Diastolic BP Percentile --      Pulse Rate 04/22/23 0813 82      Resp 04/22/23 0813 18     Temp 04/22/23 0813 98.6 F (37 C)     Temp Source 04/22/23 0813 Oral     SpO2 04/22/23 0813 95 %     Weight --      Height --      Head Circumference --      Peak Flow --      Pain Score 04/22/23 0814 4     Pain Loc --      Pain Education --      Exclude from Growth Chart --    No data found.  Updated Vital Signs BP 131/76 (BP Location: Right Arm)   Pulse 82   Temp 98.6 F (37 C) (Oral)   Resp 18   SpO2 95%   Visual Acuity Right Eye Distance:   Left Eye Distance:   Bilateral Distance:    Right Eye Near:   Left Eye Near:    Bilateral Near:     Physical Exam Vitals and nursing note reviewed.  Constitutional:      General: She is not in acute distress.    Appearance: Normal appearance.  HENT:     Head: Normocephalic.     Right Ear: Tympanic membrane, ear canal and external ear normal.     Left Ear: Tympanic membrane, ear canal and external ear normal.     Nose: Congestion present.     Mouth/Throat:     Mouth: Mucous membranes are moist.     Pharynx: Posterior oropharyngeal erythema present.     Comments: Cobblestoning present to posterior oropharynx Eyes:     Extraocular Movements: Extraocular movements intact.     Conjunctiva/sclera: Conjunctivae normal.     Pupils: Pupils are equal, round, and reactive to light.  Cardiovascular:     Rate and Rhythm: Normal rate and regular rhythm.     Pulses: Normal pulses.     Heart sounds: Normal heart sounds.  Pulmonary:     Effort: Pulmonary effort is normal. No respiratory distress.     Breath sounds: Normal breath sounds. No stridor. No wheezing, rhonchi or rales.  Abdominal:     General: Bowel sounds are normal.     Palpations: Abdomen is soft.     Tenderness: There is no abdominal tenderness.  Musculoskeletal:     Cervical back: Normal range of motion.  Lymphadenopathy:     Cervical: No cervical adenopathy.  Skin:    General: Skin is warm and dry.  Neurological:     General: No  focal deficit present.     Mental Status: She is alert and oriented to person, place, and time.  Psychiatric:        Mood and Affect: Mood normal.        Behavior: Behavior normal.      UC Treatments / Results  Labs (all labs ordered are listed, but only abnormal results are displayed) Labs Reviewed  SARS CORONAVIRUS 2 (TAT 6-24 HRS)  POCT INFLUENZA A/B    EKG   Radiology No results found.  Procedures Procedures (including critical care time)  Medications Ordered in UC Medications - No data to display  Initial Impression / Assessment and Plan / UC Course  I have reviewed the triage vital signs and the nursing notes.  Pertinent labs & imaging results that were available during my care of the patient were reviewed by me and considered in my medical decision making (see chart for details).  The patient is well-appearing, he is in no acute distress, vital signs are stable.  The influenza test was negative, COVID test is pending.  Patient is able to receive Paxlovid if her COVID test is positive (she will need to hold the Crestor for 2 weeks).  COVID test and throat culture are pending.  Suspect symptoms are related to viral upper respiratory infection, suspect COVID given his most recent exposure.  Will provide symptomatic treatment with Bromfed-DM for the cough and nasal congestion, and ipratropium 0.3% nasal spray for nasal congestion.  Supportive care recommendations were provided and discussed with the patient to include increasing fluids, allowing for plenty of rest, over-the-counter analgesics, normal saline nasal spray, and warm salt water gargles.  Discussed viral etiology with the patient and when follow-up may be indicated.  Also discussed with patient that if his test is positive for COVID, he should remain home until his been fever free for 24 hours with no medication.  Patient is in agreement with this plan of care and verbalizes understanding.  All questions were  answered.  Patient stable for discharge.  Work note was provided.  Final Clinical Impressions(s) / UC Diagnoses   Final diagnoses:  Encounter for screening for COVID-19   Discharge Instructions   None    ED Prescriptions   None    PDMP not reviewed this encounter.   Abran Cantor, NP 04/22/23 [phone]

## 2023-04-22 NOTE — Discharge Instructions (Addendum)
The influenza test was negative.  Your COVID test is pending.  You will be contacted if the pending test result is positive.  You will also have access to results via MyChart.  If you begin the medication for COVID, you will need to hold the Crestor for 2 weeks. Take medication as prescribed. Increase fluids and allow for plenty of rest. May take over-the-counter Tylenol as needed for pain, fever, general discomfort. Recommend normal saline nasal spray throughout the day for nasal congestion and runny nose. If you develop a cough, may take over-the-counter Robitussin.  I will also be helpful to use a humidifier and sleep elevated on pillows while symptoms persist. As discussed, if you experience worsening symptoms to include high fever, chills, wheezing, difficulty breathing, shortness of breath, or other concerns, please go to the emergency department immediately for further evaluation. While you are experiencing a fever, you should remain home.  You should remain home until you have been fever- free for 24 hours with no medication. If your COVID test is positive, you should wear your mask while you are experiencing symptoms and while taking the medication.  If you continue to experience symptoms after completing the medication, continue to wear your mask for an additional 5 days. Please notify your primary care physician of your recent positive COVID test. Follow-up as needed.

## 2023-04-23 ENCOUNTER — Telehealth: Payer: Self-pay

## 2023-04-23 LAB — SARS CORONAVIRUS 2 (TAT 6-24 HRS): SARS Coronavirus 2: POSITIVE — AB

## 2023-04-23 NOTE — Telephone Encounter (Signed)
Patient informed of positive covid result.  Patient declined covid medication, states she is feeling a lot better today.

## 2023-05-02 ENCOUNTER — Encounter: Payer: Self-pay | Admitting: Family Medicine

## 2023-05-02 ENCOUNTER — Ambulatory Visit (INDEPENDENT_AMBULATORY_CARE_PROVIDER_SITE_OTHER): Payer: PPO | Admitting: Family Medicine

## 2023-05-02 VITALS — BP 130/82 | HR 69 | Temp 98.2°F | Ht 67.5 in | Wt 175.4 lb

## 2023-05-02 DIAGNOSIS — E7800 Pure hypercholesterolemia, unspecified: Secondary | ICD-10-CM | POA: Diagnosis not present

## 2023-05-02 DIAGNOSIS — I480 Paroxysmal atrial fibrillation: Secondary | ICD-10-CM | POA: Diagnosis not present

## 2023-05-02 DIAGNOSIS — R739 Hyperglycemia, unspecified: Secondary | ICD-10-CM

## 2023-05-02 DIAGNOSIS — Z23 Encounter for immunization: Secondary | ICD-10-CM | POA: Diagnosis not present

## 2023-05-02 NOTE — Addendum Note (Signed)
Addended by: Venia Carbon K on: 05/02/2023 08:41 AM   Modules accepted: Orders

## 2023-05-02 NOTE — Progress Notes (Signed)
Subjective:    Patient ID: Tara Brennan, female    DOB: 06/20/1951, 72 y.o.   MRN: [id number]  HPI  Patient is a very pleasant 72 year old Caucasian female who presents today for a checkup.  Nov 2020, she had an episode of atrial fibrillation.  Today she is in normal sinus rhythm.  She is taking Cardizem to regulate her heart rate.  Her blood pressure is excellent.  She has been working with a nutritionist and has lost 10 pounds.  Her last lab work was significant for a mildly elevated blood sugar of 105.  She is also on Crestor for hyperlipidemia.  She is interested in stopping that medication.  Ideally I like to see her LDL cholesterol under 100 so would depend on how good her cholesterol is today.  It is well below 50, I believe she can stop the Crestor.  She is on Protonix and she has been taking this for years.  She denies any reflux today.  She is not sure if she needs to continue the medication.  She also has a history of osteoporosis.  She has been on Fosamax for many years.  Her bone density test last year did show a slight deterioration in her hip and her left forearm compared to 2020.  Therefore I would continue the Fosamax at the present time and repeat the bone density next year.  She is due for a flu shot as well as shingles vaccine.  She schedules her mammograms and this is up-to-date. Past Medical History:  Diagnosis Date   Anxiety    GERD (gastroesophageal reflux disease)    Osteopenia    Ovarian cyst    Paroxysmal atrial fibrillation [facility])    Diagnosed November 2020     Past Surgical History:  Procedure Laterality Date   ABDOMINAL HYSTERECTOMY  1984   ESOPHAGOGASTRODUODENOSCOPY  06/28/2012   Procedure: ESOPHAGOGASTRODUODENOSCOPY (EGD);  Surgeon: Petra Kuba, MD;  Location: [facility] ENDOSCOPY;  Service: Endoscopy;  Laterality: N/A;   OVARIAN CYST SURGERY     PARTIAL HYSTERECTOMY     Current Outpatient Medications on File Prior to Visit  Medication Sig Dispense Refill    acetaminophen (TYLENOL) 500 MG tablet Take 500 mg by mouth every 6 (six) hours as needed for mild pain or moderate pain.     alendronate (FOSAMAX) 70 MG tablet TAKE 1 TABLET EVERY WEEK INTHE MORNING 30 MIN BEFORE EATING WITH AN 8OZ GLASS OF WATER (SIT UP 30 MIN) 12 tablet 0   apixaban (ELIQUIS) 5 MG TABS tablet TAKE (1) TABLET BY MOUTH TWICE DAILY. 180 tablet 1   diltiazem (CARDIZEM CD) 180 MG 24 hr capsule TAKE (1) CAPSULE BY MOUTH ONCE DAILY. 100 capsule 2   fluticasone (FLONASE) 50 MCG/ACT nasal spray Place 2 sprays into both nostrils daily. 16 g 0   omega-3 acid ethyl esters (LOVAZA) 1 g capsule Take 1,400 mg by mouth 2 (two) times daily.     pantoprazole (PROTONIX) 40 MG tablet TAKE (1) TABLET BY MOUTH ONCE DAILY. 100 tablet 3   rosuvastatin (CRESTOR) 5 MG tablet Take 1 tablet (5 mg total) by mouth daily. 100 tablet 1   Vitamin E 400 units TABS Take 400 Units by mouth daily.     No current facility-administered medications on file prior to visit.   No Known Allergies Social History   Socioeconomic History   Marital status: Married    Spouse name: Dorene Sorrow   Number of children: 2   Years of education:  Not on file   Highest education level: 12th grade  Occupational History   Not on file  Tobacco Use   Smoking status: Never   Smokeless tobacco: Never  Vaping Use   Vaping status: Never Used  Substance and Sexual Activity   Alcohol use: No   Drug use: No   Sexual activity: Yes    Birth control/protection: Surgical  Other Topics Concern   Not on file  Social History Narrative   ** Merged History Encounter **    2 sons   3 grand children    Social Determinants of Health   Financial Resource Strain: Low Risk  (05/01/2023)   Overall Financial Resource Strain (CARDIA)    Difficulty of Paying Living Expenses: Not hard at all  Food Insecurity: No Food Insecurity (05/01/2023)   Hunger Vital Sign    Worried About Running Out of Food in the Last Year: Never true    Ran Out of Food in  the Last Year: Never true  Transportation Needs: No Transportation Needs (05/01/2023)   PRAPARE - Administrator, Civil Service (Medical): No    Lack of Transportation (Non-Medical): No  Physical Activity: Insufficiently Active (05/01/2023)   Exercise Vital Sign    Days of Exercise per Week: 4 days    Minutes of Exercise per Session: 30 min  Stress: No Stress Concern Present (09/21/2022)   [employer] of Occupational Health - Occupational Stress Questionnaire    Feeling of Stress : Not at all  Social Connections: Socially Integrated (05/01/2023)   Social Connection and Isolation Panel [NHANES]    Frequency of Communication with Friends and Family: More than three times a week    Frequency of Social Gatherings with Friends and Family: Three times a week    Attends Religious Services: More than 4 times per year    Active Member of Clubs or Organizations: Yes    Attends Banker Meetings: More than 4 times per year    Marital Status: Married  Catering manager Violence: Not At Risk (09/21/2022)   Humiliation, Afraid, Rape, and Kick questionnaire    Fear of Current or Ex-Partner: No    Emotionally Abused: No    Physically Abused: No    Sexually Abused: No    Review of Systems     Objective:   Physical Exam Vitals reviewed.  Constitutional:      Appearance: She is normal weight.  Cardiovascular:     Rate and Rhythm: Normal rate and regular rhythm.     Pulses: Normal pulses.     Heart sounds: Normal heart sounds. No murmur heard.    No friction rub. No gallop.  Pulmonary:     Effort: Pulmonary effort is normal. No respiratory distress.     Breath sounds: Normal breath sounds. No wheezing, rhonchi or rales.  Abdominal:     General: Abdomen is flat. Bowel sounds are normal. There is no distension.     Palpations: Abdomen is soft. There is no mass.     Tenderness: There is no abdominal tenderness. There is no guarding.     Hernia: No hernia is present.   Musculoskeletal:     Right lower leg: No edema.     Left lower leg: No edema.  Neurological:     Mental Status: She is alert.           Assessment & Plan:  Paroxysmal atrial fibrillation (HCC)  Pure hypercholesterolemia - Plan: CBC with Differential/Platelet, COMPLETE METABOLIC  PANEL WITH GFR, Lipid panel  Elevated blood sugar - Plan: Hemoglobin A1c Patient received her flu shot today.  She also received the shingles vaccine.  Her blood pressure is excellent.  Her heart rate is controlled and she is appropriately anticoagulated.  I will check a CBC a CMP and a lipid panel.  If her LDL cholesterol is well below 50, I believe she can stop the Crestor safely.  I will check an A1c given her mild elevation in her blood sugar.  For the time being we will continue Fosamax and repeat a bone density next year.  Add calcium 1200 mg a day and vitamin D 1000.  Discontinue Protonix and resume medication only develop current symptoms.

## 2023-05-03 LAB — COMPLETE METABOLIC PANEL WITH GFR
AG Ratio: 2.1 (calc) (ref 1.0–2.5)
ALT: 13 U/L (ref 6–29)
AST: 17 U/L (ref 10–35)
Albumin: 4.6 g/dL (ref 3.6–5.1)
Alkaline phosphatase (APISO): 50 U/L (ref 37–153)
BUN: 18 mg/dL (ref 7–25)
CO2: 29 mmol/L (ref 20–32)
Calcium: 9.5 mg/dL (ref 8.6–10.4)
Chloride: 106 mmol/L (ref 98–110)
Creat: 0.75 mg/dL (ref 0.60–1.00)
Globulin: 2.2 g/dL (calc) (ref 1.9–3.7)
Glucose, Bld: 99 mg/dL (ref 65–99)
Potassium: 4.7 mmol/L (ref 3.5–5.3)
Sodium: 142 mmol/L (ref 135–146)
Total Bilirubin: 0.6 mg/dL (ref 0.2–1.2)
Total Protein: 6.8 g/dL (ref 6.1–8.1)
eGFR: 85 mL/min/{1.73_m2} (ref >=60)

## 2023-05-03 LAB — CBC WITH DIFFERENTIAL/PLATELET
Absolute Monocytes: 448 cells/uL (ref 200–950)
Basophils Absolute: 18 cells/uL (ref 0–200)
Basophils Relative: 0.3 %
Eosinophils Absolute: 89 cells/uL (ref 15–500)
Eosinophils Relative: 1.5 %
HCT: 39.2 % (ref 35.0–45.0)
Hemoglobin: 12.6 g/dL (ref 11.7–15.5)
Lymphs Abs: 1634 cells/uL (ref 850–3900)
MCH: 28.4 pg (ref 27.0–33.0)
MCHC: 32.1 g/dL (ref 32.0–36.0)
MCV: 88.3 fL (ref 80.0–100.0)
MPV: 11.2 fL (ref 7.5–12.5)
Monocytes Relative: 7.6 %
Neutro Abs: 3711 cells/uL (ref 1500–7800)
Neutrophils Relative %: 62.9 %
Platelets: 314 10*3/uL (ref 140–400)
RBC: 4.44 10*6/uL (ref 3.80–5.10)
RDW: 12.7 % (ref 11.0–15.0)
Total Lymphocyte: 27.7 %
WBC: 5.9 10*3/uL (ref 3.8–10.8)

## 2023-05-03 LAB — LIPID PANEL
Cholesterol: 162 mg/dL (ref <200)
HDL: 51 mg/dL (ref >=50)
LDL Cholesterol (Calc): 94 mg/dL (calc)
Non-HDL Cholesterol (Calc): 111 mg/dL (calc) (ref <130)
Total CHOL/HDL Ratio: 3.2 (calc) (ref <5.0)
Triglycerides: 83 mg/dL (ref <150)

## 2023-05-03 LAB — HEMOGLOBIN A1C
Hgb A1c MFr Bld: 5.9 % of total Hgb — ABNORMAL HIGH (ref <5.7)
Mean Plasma Glucose: 123 mg/dL
eAG (mmol/L): 6.8 mmol/L

## 2023-05-04 ENCOUNTER — Ambulatory Visit: Payer: PPO | Admitting: Family Medicine

## 2023-05-07 ENCOUNTER — Other Ambulatory Visit: Payer: Self-pay | Admitting: Family Medicine

## 2023-05-07 DIAGNOSIS — E7800 Pure hypercholesterolemia, unspecified: Secondary | ICD-10-CM

## 2023-05-08 ENCOUNTER — Encounter: Payer: Self-pay | Admitting: Family Medicine

## 2023-05-08 NOTE — Telephone Encounter (Signed)
Requested Prescriptions  Pending Prescriptions Disp Refills   rosuvastatin (CRESTOR) 5 MG tablet [Pharmacy Med Name: rosuvastatin 5 mg tablet] 100 tablet 1    Sig: Take 1 tablet (5 mg total) by mouth daily.     Cardiovascular:  Antilipid - Statins 2 Failed - 05/07/2023  8:03 AM      Failed - Valid encounter within last 12 months    Recent Outpatient Visits           1 year ago Paroxysmal atrial fibrillation (HCC)   [facility] Tanya Nones, Priscille Heidelberg, MD   2 years ago Paroxysmal atrial fibrillation [facility])   [facility] Pickard, Priscille Heidelberg, MD   2 years ago Thyroid nodule   [facility] Donita Brooks, MD   2 years ago LLQ abdominal pain   [facility] Valentino Nose, NP   2 years ago Rib pain   [facility] Pickard, Priscille Heidelberg, MD       Future Appointments             In 5 months Jonelle Sidle, MD [facility] [place] [facility]            Failed - Lipid Panel in normal range within the last 12 months    Cholesterol  Date Value Ref Range Status  05/02/2023 162 <200 mg/dL Final   LDL Cholesterol (Calc)  Date Value Ref Range Status  05/02/2023 94 mg/dL (calc) Final    Comment:    Reference range: <100 . Desirable range <100 mg/dL for primary prevention;   <70 mg/dL for patients with CHD or diabetic patients  with > or = 2 CHD risk factors. Marland Kitchen LDL-C is now calculated using the Martin-Hopkins  calculation, which is a validated novel method providing  better accuracy than the Friedewald equation in the  estimation of LDL-C.  Horald Pollen et al. Lenox Ahr. 0102;725(36): 2061-2068  (http://education.[facility].com/faq/FAQ164)    HDL  Date Value Ref Range Status  05/02/2023 51 > OR = 50 mg/dL Final   Triglycerides  Date Value Ref Range Status  05/02/2023 83 <150 mg/dL Final         Passed - Cr in normal range and within 360 days    Creat  Date Value Ref Range  Status  05/02/2023 0.75 0.60 - 1.00 mg/dL Final         Passed - Patient is not pregnant

## 2023-05-18 ENCOUNTER — Encounter: Payer: Self-pay | Admitting: Family Medicine

## 2023-05-18 ENCOUNTER — Other Ambulatory Visit: Payer: Self-pay

## 2023-06-12 ENCOUNTER — Encounter: Payer: Self-pay | Admitting: Family Medicine

## 2023-06-13 ENCOUNTER — Encounter: Payer: Self-pay | Admitting: Nutrition

## 2023-06-13 ENCOUNTER — Encounter: Payer: PPO | Attending: Family Medicine | Admitting: Nutrition

## 2023-06-13 VITALS — Ht 67.5 in | Wt 174.0 lb

## 2023-06-13 DIAGNOSIS — E782 Mixed hyperlipidemia: Secondary | ICD-10-CM | POA: Insufficient documentation

## 2023-06-13 DIAGNOSIS — E663 Overweight: Secondary | ICD-10-CM | POA: Insufficient documentation

## 2023-06-13 NOTE — Progress Notes (Signed)
Medical Nutrition Therapy  Appointment Start time:  0800  Appointment End time: 0830  Primary concerns today: HYperlipidemia,   Referral diagnosis: Hyperlipidemia Preferred learning style: No preference Learning readiness: Ready    NUTRITION ASSESSMENT  72 yr old wfemale referred for Hyperlipidemia. PCP Dr. Tanya Nones PMH: AFIB, Hyperlipidemia, Saw Dr. Tanya Nones,   She has done excellent with lifestyle changes.  Chol down from 218 to 162 mg/dl. LDL improved from 144 down to 94 mgd/. She is eating more whole plant based foods. Cut out processed foods. Cut out junk food and snacking. Taking Vit D now. WIll get bone density in Feb. MD stopped her reflux medications due to risk of osteoporosis and bone density. However, since making diet changes, she no longer has reflux symptoms. Lost 14 lbs. Has been exercising some but would like to do more.  Goals set previously: Increase more whole plant based foods with all meals-done Cut out dairy, red meat, pork and processed foods-done Get in 25 grams of fiber  daily/-done Get TCHOL less than 200, LDL Less than 70 -Done Cut out commercialized products-done She is willing to work on Lifestyle Medicine with incorporating more whole plant base foods, working on better sleep, increased physical activity and cutting out processed foods.  Anthropometrics  Wt Readings from Last 3 Encounters:  06/13/23 174 lb (78.9 kg)  05/02/23 175 lb 6.4 oz (79.6 kg)  03/09/23 188 lb (85.3 kg)   Ht Readings from Last 3 Encounters:  06/13/23 5' 7.5" (1.715 m)  05/02/23 5' 7.5" (1.715 m)  03/09/23 5' 7.5" (1.715 m)   Body mass index is 26.85 kg/m. @BMIFA @ Facility age limit for growth %iles is 20 years. Facility age limit for growth %iles is 20 years.  Clinical Medical Hx: See chart Medications: See chart Labs:     Latest Ref Rng & Units 05/02/2023    8:21 AM 10/24/2022    9:37 AM 04/08/2022   10:09 AM  CMP  Glucose 65 - 99 mg/dL 99  782  956   BUN 7 - 25  mg/dL 18  22  22    Creatinine 0.60 - 1.00 mg/dL 2.13  0.86  5.78   Sodium 135 - 146 mmol/L 142  139  141   Potassium 3.5 - 5.3 mmol/L 4.7  4.6  5.1   Chloride 98 - 110 mmol/L 106  103  105   CO2 20 - 32 mmol/L 29  28  28    Calcium 8.6 - 10.4 mg/dL 9.5  9.3  9.9   Total Protein 6.1 - 8.1 g/dL 6.8  7.4  6.9   Total Bilirubin 0.2 - 1.2 mg/dL 0.6  0.8  0.6   Alkaline Phos 38 - 126 U/L  72    AST 10 - 35 U/L 17  31  21    ALT 6 - 29 U/L 13  25  18     Lipid Panel     Component Value Date/Time   CHOL 162 05/02/2023 0821   TRIG 83 05/02/2023 0821   HDL 51 05/02/2023 0821   CHOLHDL 3.2 05/02/2023 0821   VLDL 11 10/24/2022 0934   LDLCALC 94 05/02/2023 0821    Notable Signs/Symptoms: none  Lifestyle & Dietary Hx Married and her husband does most of the cooking.  Estimated daily fluid intake: 64 oz Supplements: VIt E,Calcium Sleep: poor  Stress / self-care: No issues Current average weekly physical activity: Walks 30 minutes 3 times per week. Cares for grandkids. B) Quick, fat free milk,, Strawberry Protein  shake Snack fiber one bar 5 pm Baked potato and [place], water  Beverages: water  Estimated Energy Needs Calories: 1400 Carbohydrate: 158g Protein: 105g Fat: 39g   NUTRITION DIAGNOSIS  NI-5.6.3 Inappropriate intake of fats (specify): Cholesterol and saturated fats As related to Hyperlipidemia.  As evidenced by eating red meat, dairy products, processed and communalized food products..   NUTRITION INTERVENTION  Nutrition education (E-1) on the following topics:  Nutrition and Pre-Diabetes education provided on My Plate, CHO counting, meal planning, portion sizes, timing of meals, , benefits of exercising 30 minutes per day and prevention of DM.   Lifestyle Medicine  - Whole Food, Plant Predominant Nutrition is highly recommended: Eat Plenty of vegetables, Mushrooms, fruits, Legumes, Whole Grains, Nuts, seeds in lieu of processed meats, processed snacks/pastries red meat,  poultry, eggs.    -It is better to avoid simple carbohydrates including: Cakes, Sweet Desserts, Ice Cream, Soda (diet and regular), Sweet Tea, Candies, Chips, Cookies, Store Bought Juices, Alcohol in Excess of  1-2 drinks a day, Lemonade,  Artificial Sweeteners, Doughnuts, Coffee Creamers, "Sugar-free" Products, etc, etc.  This is not a complete list.....  Exercise: If you are able: 30 -60 minutes a day ,4 days a week, or 150 minutes a week.  The longer the better.  Combine stretch, strength, and aerobic activities.  If you were told in the past that you have high risk for cardiovascular diseases, you may seek evaluation by your heart doctor prior to initiating moderate to intense exercise programs.   Nutrition Therapy for Hyperlipidemia.   Handouts Provided Include  Lifestyle Medicine handouts Hyperlipidemia handout  Learning Style & Readiness for Change Teaching method utilized: Visual & Auditory  Demonstrated degree of understanding via: Teach Back  Barriers to learning/adherence to lifestyle change: none  Goals Established by Pt  Goals   Increase exercise 3-4 times per week; walking or treadmilll and exercise bike. Lose 2 lbs per month Get A1C 5.7% or below. Get LDL less than 70 mg/dl. Keep up the great job!!  MONITORING & EVALUATION Dietary intake, weekly physical activity, and cholesterol  in 6 months.  Next Steps  Patient is to work on eating more whole plant based foods .

## 2023-06-13 NOTE — Patient Instructions (Addendum)
Goals   Increase exercise 3-4 times per week; walking or treadmilll and exercise bike. Lose 2 lbs per month Get A1C 5.7% or below. Get LDL less than 70 mg/dl. Keep up the great job!!

## 2023-06-15 ENCOUNTER — Telehealth: Payer: Self-pay | Admitting: Family Medicine

## 2023-06-15 ENCOUNTER — Encounter: Payer: Self-pay | Admitting: Family Medicine

## 2023-06-15 NOTE — Telephone Encounter (Signed)
Patient requested samples of Eliquis, 5MG . Dispensed 4 boxes (28 days). Patient came to the office to pick them up today.

## 2023-07-11 ENCOUNTER — Telehealth: Payer: Self-pay | Admitting: *Deleted

## 2023-07-11 NOTE — Patient Outreach (Signed)
  Care Coordination   07/11/2023  Name: Tara Brennan MRN: [id number] DOB: 09-04-1950   Care Coordination Outreach Attempts:  An unsuccessful telephone outreach was attempted today to offer the patient information about available care coordination services. HIPAA compliant messages left on voicemail providing contact information for CSW, encouraging patient to return CSW's call at her earliest convenience.  Follow Up Plan:  Additional outreach attempts will be made to offer the patient care coordination information and services.   Encounter Outcome:  No Answer.   Care Coordination Interventions:  No, not indicated.    Danford Bad, BSW, MSW, Printmaker Social Work Case Set designer Health  [facility], Population Health Direct Dial: [phone]  Fax: [phone] Email: [email or web address]: [email or web address]

## 2023-07-14 ENCOUNTER — Telehealth: Payer: Self-pay | Admitting: *Deleted

## 2023-07-14 NOTE — Patient Outreach (Signed)
  Care Coordination   07/14/2023  Name: Tara Brennan MRN: [id number] DOB: March 02, 1951   Care Coordination Outreach Attempts:  A second unsuccessful outreach was attempted today to offer the patient with information about available care coordination services. HIPAA compliant messages left on voicemail providing contact information for CSW, encouraging patient to return CSW's call at her earliest convenience.  Follow Up Plan:  Additional outreach attempts will be made to offer the patient care coordination information and services.   Encounter Outcome:  No Answer.   Care Coordination Interventions:  No, not indicated.    Danford Bad, BSW, MSW, Printmaker Social Work Case Set designer Health  [facility], Population Health Direct Dial: [phone]  Fax: [phone] Email: [email or web address]: [email or web address]

## 2023-07-15 ENCOUNTER — Encounter: Payer: Self-pay | Admitting: Cardiology

## 2023-07-17 ENCOUNTER — Other Ambulatory Visit: Payer: Self-pay

## 2023-07-17 MED ORDER — APIXABAN 5 MG PO TABS: 5.0000 mg | ORAL_TABLET | Freq: Two times a day (BID) | ORAL | 0 refills | Status: DC

## 2023-07-18 ENCOUNTER — Other Ambulatory Visit: Payer: Self-pay | Admitting: Family Medicine

## 2023-07-18 ENCOUNTER — Encounter: Payer: Self-pay | Admitting: Family Medicine

## 2023-07-18 ENCOUNTER — Telehealth: Payer: Self-pay | Admitting: *Deleted

## 2023-07-18 MED ORDER — PREDNISONE 20 MG PO TABS: ORAL_TABLET | ORAL | 0 refills | Status: DC

## 2023-07-18 NOTE — Patient Outreach (Addendum)
  Care Coordination   07/18/2023  Name: Tara Brennan MRN: [id number] DOB: Apr 08, 1951   Care Coordination Outreach Attempts:  A third unsuccessful outreach was attempted today to offer the patient with information about available care coordination services. HIPAA compliant messages left on voicemail providing contact information for CSW, encouraging patient to return CSW's call at her earliest convenience.   Follow Up Plan:  No additional outreach attempts will be made to offer the patient care coordination information and services.   Encounter Outcome:  No Answer.   Care Coordination Interventions:  No, not indicated.    Danford Bad, BSW, MSW, Printmaker Social Work Case Set designer Health  [facility], Population Health Direct Dial: [phone]  Fax: [phone] Email: [email or web address]: [email or web address]

## 2023-07-25 ENCOUNTER — Other Ambulatory Visit: Payer: Self-pay | Admitting: Family Medicine

## 2023-07-25 MED ORDER — TRAMADOL HCL 50 MG PO TABS: 50.0000 mg | ORAL_TABLET | Freq: Three times a day (TID) | ORAL | 0 refills | Status: AC | PRN

## 2023-07-28 ENCOUNTER — Ambulatory Visit: Payer: PPO | Admitting: Family Medicine

## 2023-07-28 VITALS — BP 124/82 | HR 75 | Temp 98.6°F | Ht 67.5 in | Wt 173.4 lb

## 2023-07-28 DIAGNOSIS — M545 Low back pain, unspecified: Secondary | ICD-10-CM

## 2023-07-28 MED ORDER — METHOCARBAMOL 500 MG PO TABS: 500.0000 mg | ORAL_TABLET | Freq: Four times a day (QID) | ORAL | 0 refills | Status: AC | PRN

## 2023-07-28 MED ORDER — PREDNISONE 20 MG PO TABS: ORAL_TABLET | ORAL | 0 refills | Status: DC

## 2023-07-28 NOTE — Progress Notes (Signed)
Subjective:    Patient ID: Tara Brennan, female    DOB: 08-May-1951, 72 y.o.   MRN: [id number]  Back Pain    Patient reports a 1 week history of back pain.  The pain is located in the right lower flank.  She states that when she twists, it hurts.  She was having severe pain and spasms for 2 days.  That improved after initial round of prednisone.  She cannot take NSAIDs due to anticoagulation.  She denies any sciatica.  She denies any numbness or tingling in her legs.  She reports sharp cramp-like spasms and pain and stiffness in her lower back.  She is not sure how she hurt her back.  Muscle strength in her legs is 5/5 and equal and symmetric.  She has normal reflexes.  There is no evidence of cauda equina syndrome. Past Medical History:  Diagnosis Date   Anxiety    GERD (gastroesophageal reflux disease)    Osteopenia    Ovarian cyst    Paroxysmal atrial fibrillation [facility])    Diagnosed November 2020     Past Surgical History:  Procedure Laterality Date   ABDOMINAL HYSTERECTOMY  1984   ESOPHAGOGASTRODUODENOSCOPY  06/28/2012   Procedure: ESOPHAGOGASTRODUODENOSCOPY (EGD);  Surgeon: Petra Kuba, MD;  Location: [facility] ENDOSCOPY;  Service: Endoscopy;  Laterality: N/A;   OVARIAN CYST SURGERY     PARTIAL HYSTERECTOMY     Current Outpatient Medications on File Prior to Visit  Medication Sig Dispense Refill   alendronate (FOSAMAX) 70 MG tablet TAKE 1 TABLET EVERY WEEK INTHE MORNING 30 MIN BEFORE EATING WITH AN 8OZ GLASS OF WATER (SIT UP 30 MIN) 12 tablet 0   apixaban (ELIQUIS) 5 MG TABS tablet Take 1 tablet (5 mg total) by mouth 2 (two) times daily. 28 tablet 0   diltiazem (CARDIZEM CD) 180 MG 24 hr capsule TAKE (1) CAPSULE BY MOUTH ONCE DAILY. 100 capsule 2   omega-3 acid ethyl esters (LOVAZA) 1 g capsule Take 1,400 mg by mouth 2 (two) times daily.     predniSONE (DELTASONE) 20 MG tablet 3 tabs poqday 1-2, 2 tabs poqday 3-4, 1 tab poqday 5-6 (Patient not taking: Reported on 07/28/2023) 12  tablet 0   rosuvastatin (CRESTOR) 5 MG tablet Take 1 tablet (5 mg total) by mouth daily. 100 tablet 1   Vitamin E 400 units TABS Take 400 Units by mouth daily.     acetaminophen (TYLENOL) 500 MG tablet Take 500 mg by mouth every 6 (six) hours as needed for mild pain or moderate pain.     apixaban (ELIQUIS) 5 MG TABS tablet TAKE (1) TABLET BY MOUTH TWICE DAILY. 180 tablet 1   traMADol (ULTRAM) 50 MG tablet Take 1 tablet (50 mg total) by mouth every 8 (eight) hours as needed for up to 10 days. 30 tablet 0   No current facility-administered medications on file prior to visit.   No Known Allergies Social History   Socioeconomic History   Marital status: Married    Spouse name: Dorene Sorrow   Number of children: 2   Years of education: Not on file   Highest education level: 12th grade  Occupational History   Not on file  Tobacco Use   Smoking status: Never   Smokeless tobacco: Never  Vaping Use   Vaping status: Never Used  Substance and Sexual Activity   Alcohol use: No   Drug use: No   Sexual activity: Yes    Birth control/protection: Surgical  Other  Topics Concern   Not on file  Social History Narrative   ** Merged History Encounter **    2 sons   3 grand children    Social Drivers of Corporate investment banker Strain: Low Risk  (07/28/2023)   Overall Financial Resource Strain (CARDIA)    Difficulty of Paying Living Expenses: Not hard at all  Food Insecurity: No Food Insecurity (07/28/2023)   Hunger Vital Sign    Worried About Running Out of Food in the Last Year: Never true    Ran Out of Food in the Last Year: Never true  Transportation Needs: No Transportation Needs (07/28/2023)   PRAPARE - Administrator, Civil Service (Medical): No    Lack of Transportation (Non-Medical): No  Physical Activity: Insufficiently Active (07/28/2023)   Exercise Vital Sign    Days of Exercise per Week: 4 days    Minutes of Exercise per Session: 30 min  Stress: No Stress Concern  Present (07/28/2023)   [employer] of Occupational Health - Occupational Stress Questionnaire    Feeling of Stress : Only a little  Social Connections: Socially Integrated (07/28/2023)   Social Connection and Isolation Panel [NHANES]    Frequency of Communication with Friends and Family: More than three times a week    Frequency of Social Gatherings with Friends and Family: More than three times a week    Attends Religious Services: More than 4 times per year    Active Member of [employer] or Organizations: Yes    Attends Engineer, structural: More than 4 times per year    Marital Status: Married  Catering manager Violence: Not At Risk (09/21/2022)   Humiliation, Afraid, Rape, and Kick questionnaire    Fear of Current or Ex-Partner: No    Emotionally Abused: No    Physically Abused: No    Sexually Abused: No    Review of Systems  Musculoskeletal:  Positive for back pain.       Objective:   Physical Exam Vitals reviewed.  Constitutional:      Appearance: She is normal weight.  Cardiovascular:     Rate and Rhythm: Normal rate and regular rhythm.     Pulses: Normal pulses.     Heart sounds: Normal heart sounds. No murmur heard.    No friction rub. No gallop.  Pulmonary:     Effort: Pulmonary effort is normal. No respiratory distress.     Breath sounds: Normal breath sounds. No wheezing, rhonchi or rales.  Musculoskeletal:     Lumbar back: Spasms present. No tenderness or bony tenderness. Decreased range of motion. Negative right straight leg raise test and negative left straight leg raise test.     Right lower leg: No edema.     Left lower leg: No edema.  Neurological:     Mental Status: She is alert.     Motor: No weakness.     Gait: Gait normal.     Deep Tendon Reflexes: Reflexes normal.           Assessment & Plan:  Acute right-sided low back pain without sciatica I suspect the patient failed her low back.  Recommended stretching.  Recommended warm  compresses.  Will try 1 additional round of prednisone as an anti-inflammatory as this seemed to help significantly.  Supplement with methocarbamol 500 mg every 6 hours as needed for muscle spasms.

## 2023-08-13 ENCOUNTER — Other Ambulatory Visit: Payer: Self-pay | Admitting: Family Medicine

## 2023-08-16 ENCOUNTER — Encounter: Payer: Self-pay | Admitting: Family Medicine

## 2023-08-17 ENCOUNTER — Other Ambulatory Visit: Payer: Self-pay | Admitting: Family Medicine

## 2023-08-17 DIAGNOSIS — M5431 Sciatica, right side: Secondary | ICD-10-CM

## 2023-08-24 DIAGNOSIS — Z1283 Encounter for screening for malignant neoplasm of skin: Secondary | ICD-10-CM | POA: Diagnosis not present

## 2023-08-24 DIAGNOSIS — X32XXXD Exposure to sunlight, subsequent encounter: Secondary | ICD-10-CM | POA: Diagnosis not present

## 2023-08-24 DIAGNOSIS — D485 Neoplasm of uncertain behavior of skin: Secondary | ICD-10-CM | POA: Diagnosis not present

## 2023-08-24 DIAGNOSIS — L570 Actinic keratosis: Secondary | ICD-10-CM | POA: Diagnosis not present

## 2023-08-24 DIAGNOSIS — L905 Scar conditions and fibrosis of skin: Secondary | ICD-10-CM | POA: Diagnosis not present

## 2023-08-24 DIAGNOSIS — D225 Melanocytic nevi of trunk: Secondary | ICD-10-CM | POA: Diagnosis not present

## 2023-08-24 DIAGNOSIS — D2262 Melanocytic nevi of left upper limb, including shoulder: Secondary | ICD-10-CM | POA: Diagnosis not present

## 2023-08-24 DIAGNOSIS — L988 Other specified disorders of the skin and subcutaneous tissue: Secondary | ICD-10-CM | POA: Diagnosis not present

## 2023-08-26 ENCOUNTER — Encounter: Payer: Self-pay | Admitting: Cardiology

## 2023-08-26 ENCOUNTER — Other Ambulatory Visit: Payer: Self-pay | Admitting: Cardiology

## 2023-08-28 ENCOUNTER — Other Ambulatory Visit: Payer: Self-pay | Admitting: *Deleted

## 2023-08-28 MED ORDER — APIXABAN 5 MG PO TABS: 5.0000 mg | ORAL_TABLET | Freq: Two times a day (BID) | ORAL | 1 refills | Status: DC

## 2023-08-28 NOTE — Telephone Encounter (Signed)
Prescription refill request for Eliquis received. Indication: PAF Last office visit: 09/30/22  Ival Bible MD Scr: 0.75 on 05/02/23  Epic Age: 73 Weight: 82.8kg  Based on above findings Eliquis 5mg  twice daily is the appropriate dose.  Refill approved.

## 2023-09-07 ENCOUNTER — Ambulatory Visit (HOSPITAL_COMMUNITY): Payer: PPO

## 2023-09-07 DIAGNOSIS — D485 Neoplasm of uncertain behavior of skin: Secondary | ICD-10-CM | POA: Diagnosis not present

## 2023-10-20 ENCOUNTER — Other Ambulatory Visit: Payer: Self-pay

## 2023-10-20 ENCOUNTER — Encounter (HOSPITAL_COMMUNITY): Payer: Self-pay

## 2023-10-20 ENCOUNTER — Ambulatory Visit (HOSPITAL_COMMUNITY): Payer: PPO | Attending: Family Medicine

## 2023-10-20 DIAGNOSIS — M5431 Sciatica, right side: Secondary | ICD-10-CM | POA: Insufficient documentation

## 2023-10-20 NOTE — Therapy (Signed)
 OUTPATIENT PHYSICAL THERAPY THORACOLUMBAR EVALUATION   Patient Name: Tara Brennan MRN: [id number] DOB:[birth year], 73 y.o., female Today's Date: 10/20/2023  END OF SESSION:  PT End of Session - 10/20/23 1345     Visit Number 1    Authorization Type [employer]    Authorization Time Period no auth; no limit    PT Start Time 1301    PT Stop Time 1341    PT Time Calculation (min) 40 min    Activity Tolerance Patient tolerated treatment well    Behavior During Therapy [facility] for tasks assessed/performed             Past Medical History:  Diagnosis Date   Anxiety    GERD (gastroesophageal reflux disease)    Osteopenia    Ovarian cyst    Paroxysmal atrial fibrillation [facility])    Diagnosed November 2020   Past Surgical History:  Procedure Laterality Date   ABDOMINAL HYSTERECTOMY  1984   ESOPHAGOGASTRODUODENOSCOPY  06/28/2012   Procedure: ESOPHAGOGASTRODUODENOSCOPY (EGD);  Surgeon: Petra Kuba, MD;  Location: [facility] ENDOSCOPY;  Service: Endoscopy;  Laterality: N/A;   OVARIAN CYST SURGERY     PARTIAL HYSTERECTOMY     Patient Active Problem List   Diagnosis Date Noted   Chest pain due to myocardial ischemia 09/21/2022   Family hx colonic polyps 09/21/2022   Left lower quadrant pain 09/21/2022   Diverticulitis 11/30/2020   Abnormal findings on diagnostic imaging of other parts of digestive tract 11/30/2020   Dysphagia 11/30/2020   Esophageal reflux 11/30/2020   Atrial fibrillation with RVR (HCC) 06/23/2019   Pneumothorax, right 11/30/2017   Osteoporosis 10/05/2015   Pain in toe 04/04/2013   Patellofemoral arthritis 12/07/2011   OA (osteoarthritis) of knee 12/07/2011   Knee pain 12/07/2011   KNEE, ARTHRITIS, DEGEN./OSTEO 09/14/2009    PCP: Donita Brooks, MD  REFERRING PROVIDER: Donita Brooks, MD  REFERRING DIAG: M54.31 (ICD-10-CM) - Right sided sciatica   Rationale for Evaluation and Treatment: Rehabilitation  THERAPY DIAG:  Right sided  sciatica  ONSET DATE: 2 months ago  SUBJECTIVE:                                                                                                                                                                                           SUBJECTIVE STATEMENT: Pt reporting about 2 months ago she woke up and her back started hurting. Reports she used to be a stomach sleeper but now sleeps on her side or back. Currenlty is not in any pain but reports sharp pain. Pt reports participating in ADLs and will get 'tired back' which is achy pain  or needs a massage. Watches grandchildren on Tuesday and on a split rotation, pt plays with 20 month grand child, and is moving a lot with them.   PERTINENT HISTORY:  Osteoporosis.   PAIN:  Are you having pain? No  PRECAUTIONS: None  RED FLAGS: None   WEIGHT BEARING RESTRICTIONS: No  FALLS:  Has patient fallen in last 6 months? No  PATIENT GOALS: None established  OBJECTIVE:  Note: Objective measures were completed at Evaluation unless otherwise noted.   PATIENT SURVEYS:  Modified Oswestry 3/50= 6%   COGNITION: Overall cognitive status: Within functional limits for tasks assessed     SENSATION: WFL  POSTURE: rounded shoulders, forward head, and increased lumbar lordosis  PALPATION: Non-tender to palpate along lumbar paraspinals.    LUMBAR ROM:   AROM eval  Flexion 100%  Extension 100%  Right lateral flexion 75%  Left lateral flexion 75%  Right rotation   Left rotation    (Blank rows = not tested)  LOWER EXTREMITY ROM:     Active  Right eval Left eval  Hip flexion [facility]  Hip extension    Hip abduction    Hip adduction    Hip internal rotation 10 20  Hip external rotation    Knee flexion    Knee extension    Ankle dorsiflexion    Ankle plantarflexion    Ankle inversion    Ankle eversion     (Blank rows = not tested)  LOWER EXTREMITY MMT:    MMT Right eval Left eval  Hip flexion 3+ 4-  Hip extension 3+ 4-   Hip abduction 3+ 4-  Hip adduction    Hip internal rotation    Hip external rotation    Knee flexion    Knee extension    Ankle dorsiflexion    Ankle plantarflexion    Ankle inversion    Ankle eversion     (Blank rows = not tested)  LUMBAR SPECIAL TESTS:  Prone instability test: Negative, Straight leg raise test: Negative, Slump test: Positive, and FADDIR (-) Lumbar Quadrant (-)   GAIT: Distance walked: 74ft Assistive device utilized: None Level of assistance: Complete Independence Comments: bilateral trendelenburg during stance phase.   TREATMENT DATE:   10/20/2023 PT Evaluation and HEP          Seated thomas stretch x 30'' bilaterally Supine Bridge x 10 SLR x 10 Reverse clamshell x 10 Clamshell with RTB x 10 Prone Hip extension x 10                                                                                                                         PATIENT EDUCATION:  Education details: PT Evaluation, findings, prognosis, frequency, attendance policy, and HEP and pathoanatomy of LBP.  Person educated: Patient Education method: Medical illustrator Education comprehension: verbalized understanding  HOME EXERCISE PROGRAM: Access Code: [email or web address] URL: [email or web address] Date: 10/20/2023 Prepared by: Starling Manns  Exercises - Supine Bridge  - 1 x  daily - 7 x weekly - 3 sets - 10 reps - Clamshell with Resistance  - 1 x daily - 7 x weekly - 3 sets - 10 reps - Sidelying Reverse Clamshell  - 1 x daily - 7 x weekly - 3 sets - 12-15 reps - Prone Hip Extension  - 1 x daily - 7 x weekly - 3 sets - 12-15 reps - Supine Lower Trunk Rotation  - 1 x daily - 7 x weekly - 3 sets - 20-30 reps  ASSESSMENT:  CLINICAL IMPRESSION: Patient is a 73 y.o. female who was seen today for physical therapy evaluation and treatment for M54.31 (ICD-10-CM) - Right sided sciatica. Pt reporting no pain or issues at this current level. Did report some sharp pain in R  LB and hip at initial onset but has not been hurting the past few months. Pt demonstrating some gluteal and hip weakness with limitations in lumbar lateral flexion but no pain or functional limitations noted from pt with normal daily duties. Based on outcome measure survey, pt demonstrating little to none perceived functional limitations. Pt evaluated and treated for objective muscle weakness and ROM deficits noted but only keeping for one time visit.   OBJECTIVE IMPAIRMENTS: Abnormal gait, decreased ROM, and decreased strength.   CLINICAL DECISION MAKING: Stable/uncomplicated  EVALUATION COMPLEXITY: Low   GOALS: Goals reviewed with patient? Yes  SHORT TERM GOALS: Target date: 10/20/2023  Pt will be independent with HEP in order to demonstrate participation and agreement with Physical Therapy POC Baseline:HEP given, education on pathoanatomy of LBP. Goal status: INITIAL    PLAN:  PT FREQUENCY: one time visit  PT DURATION: other: Evaluation and treatment  PLANNED INTERVENTIONS: 97110-Therapeutic exercises, 97530- Therapeutic activity, [id number]- Neuromuscular re-education, and 16109- Self Care.  PLAN FOR NEXT SESSION: One time visit.   Nelida Meuse PT, DPT [facility]- [facility] [phone] office   Nelida Meuse, PT 10/20/2023, 1:53 PM

## 2023-10-27 ENCOUNTER — Encounter: Payer: Self-pay | Admitting: Family Medicine

## 2023-10-31 ENCOUNTER — Ambulatory Visit: Payer: PPO | Admitting: Cardiology

## 2023-11-01 ENCOUNTER — Ambulatory Visit: Payer: PPO | Admitting: Cardiology

## 2023-11-02 ENCOUNTER — Ambulatory Visit

## 2023-11-10 ENCOUNTER — Encounter: Payer: Self-pay | Admitting: Cardiology

## 2023-11-17 ENCOUNTER — Other Ambulatory Visit: Payer: Self-pay | Admitting: Family Medicine

## 2023-11-17 DIAGNOSIS — E7800 Pure hypercholesterolemia, unspecified: Secondary | ICD-10-CM

## 2023-11-17 NOTE — Telephone Encounter (Signed)
 Requested Prescriptions  Pending Prescriptions Disp Refills   rosuvastatin (CRESTOR) 5 MG tablet [Pharmacy Med Name: rosuvastatin 5 mg tablet] 100 tablet 1    Sig: Take 1 tablet (5 mg total) by mouth daily.     Cardiovascular:  Antilipid - Statins 2 Failed - 11/17/2023  2:29 PM      Failed - Lipid Panel in normal range within the last 12 months    Cholesterol  Date Value Ref Range Status  05/02/2023 162 <200 mg/dL Final   LDL Cholesterol (Calc)  Date Value Ref Range Status  05/02/2023 94 mg/dL (calc) Final    Comment:    Reference range: <100 . Desirable range <100 mg/dL for primary prevention;   <70 mg/dL for patients with CHD or diabetic patients  with > or = 2 CHD risk factors. Marland Kitchen LDL-C is now calculated using the Martin-Hopkins  calculation, which is a validated novel method providing  better accuracy than the Friedewald equation in the  estimation of LDL-C.  Horald Pollen et al. Lenox Ahr. 2956;213(08): 2061-2068  (http://education.[facility].com/faq/FAQ164)    HDL  Date Value Ref Range Status  05/02/2023 51 > OR = 50 mg/dL Final   Triglycerides  Date Value Ref Range Status  05/02/2023 83 <150 mg/dL Final         Passed - Cr in normal range and within 360 days    Creat  Date Value Ref Range Status  05/02/2023 0.75 0.60 - 1.00 mg/dL Final         Passed - Patient is not pregnant      Passed - Valid encounter within last 12 months    Recent Outpatient Visits           3 months ago Acute right-sided low back pain without sciatica   [place] [facility] Donita Brooks, MD   6 months ago Paroxysmal atrial fibrillation [facility])   [place] [facility] Pickard, Priscille Heidelberg, MD   1 year ago Pure hypercholesterolemia   [place] [facility] Donita Brooks, MD   1 year ago Right-sided chest pain   [place] [facility] Donita Brooks, MD   1 year ago Paroxysmal atrial fibrillation [facility])    [place] [facility] Pickard, Priscille Heidelberg, MD       Future Appointments             In 3 weeks Diona Browner Illene Bolus, MD [facility] at [facility], [facility]

## 2023-11-24 ENCOUNTER — Ambulatory Visit: Admitting: Family Medicine

## 2023-11-24 ENCOUNTER — Encounter: Payer: Self-pay | Admitting: Family Medicine

## 2023-11-24 ENCOUNTER — Ambulatory Visit: Payer: Self-pay | Admitting: Family Medicine

## 2023-11-24 VITALS — BP 140/82 | HR 65 | Temp 97.8°F | Ht 67.5 in | Wt 183.0 lb

## 2023-11-24 DIAGNOSIS — R300 Dysuria: Secondary | ICD-10-CM | POA: Diagnosis not present

## 2023-11-24 LAB — URINALYSIS, ROUTINE W REFLEX MICROSCOPIC
Bacteria, UA: NONE SEEN /HPF
Bilirubin Urine: NEGATIVE
Glucose, UA: NEGATIVE
Hyaline Cast: NONE SEEN /LPF
Ketones, ur: NEGATIVE
Nitrite: NEGATIVE
Protein, ur: NEGATIVE
Specific Gravity, Urine: 1.01 (ref 1.001–1.035)
pH: 7 (ref 5.0–8.0)

## 2023-11-24 LAB — MICROSCOPIC MESSAGE

## 2023-11-24 MED ORDER — SULFAMETHOXAZOLE-TRIMETHOPRIM 800-160 MG PO TABS: 1.0000 | ORAL_TABLET | Freq: Two times a day (BID) | ORAL | 0 refills | Status: AC

## 2023-11-24 NOTE — Telephone Encounter (Signed)
 Summary: Possible UTI, no appt   Copied From CRM [id number]. Reason for Triage: Possible UTI, frequency, urgency, burning. No appt today  Best contact: [phone]         Chief Complaint: UTI Symptoms: burning and urinary frequency Frequency: constant Pertinent Negatives: Patient denies fever Disposition: [] ED /[] Urgent Care (no appt availability in office) / [x] Appointment(In office/virtual)/ []  [place] [facility]/ [] Home Care/ [] Refused Recommended Disposition /[] [place] [facility]/ []  Follow-up with PCP Additional Notes: appt 4/18 at 1130  Reason for Disposition  Urinating more frequently than usual (i.e., frequency)  Answer Assessment - Initial Assessment Questions 1. SYMPTOM: "What's the main symptom you're concerned about?" (e.g., frequency, incontinence)     Frequency, burning with urination  2. ONSET: "When did the  pain  start?"     Yesterday  3. PAIN: "Is there any pain?" If Yes, ask: "How bad is it?" (Scale: 1-10; mild, moderate, severe)     Moderate pain  4. CAUSE: "What do you think is causing the symptoms?"     Concern for uti  5. OTHER SYMPTOMS: "Do you have any other symptoms?" (e.g., blood in urine, fever, flank pain, pain with urination)     Lower belly pain  6. PREGNANCY: "Is there any chance you are pregnant?" "When was your last menstrual period?"     no  Protocols used: Urinary Symptoms-A-AH

## 2023-11-24 NOTE — Progress Notes (Signed)
 Subjective:    Patient ID: Tara Brennan, female    DOB: 1951/06/24, 73 y.o.   MRN: [id number]  Patient reports a 2 to 3-day history of dysuria.  She also reports increased urinary frequency.  The dysuria is mild.  She also reports hesitancy.  She denies any abdominal pain chills or fever nausea or vomiting.  Urinalysis shows trace blood and trace leukocyte esterase but is otherwise normal.  Patient took Azo yesterday.  She states that she is feeling better today. Past Medical History:  Diagnosis Date   Anxiety    GERD (gastroesophageal reflux disease)    Osteopenia    Ovarian cyst    Paroxysmal atrial fibrillation [facility])    Diagnosed November 2020     Past Surgical History:  Procedure Laterality Date   ABDOMINAL HYSTERECTOMY  1984   ESOPHAGOGASTRODUODENOSCOPY  06/28/2012   Procedure: ESOPHAGOGASTRODUODENOSCOPY (EGD);  Surgeon: Mathew Solomon, MD;  Location: [facility] ENDOSCOPY;  Service: Endoscopy;  Laterality: N/A;   OVARIAN CYST SURGERY     PARTIAL HYSTERECTOMY     Current Outpatient Medications on File Prior to Visit  Medication Sig Dispense Refill   acetaminophen  (TYLENOL ) 500 MG tablet Take 500 mg by mouth every 6 (six) hours as needed for mild pain or moderate pain.     apixaban  (ELIQUIS ) 5 MG TABS tablet TAKE (1) TABLET BY MOUTH TWICE DAILY. 180 tablet 1   apixaban  (ELIQUIS ) 5 MG TABS tablet Take 1 tablet (5 mg total) by mouth 2 (two) times daily. 200 tablet 1   diltiazem  (CARDIZEM  CD) 180 MG 24 hr capsule TAKE (1) CAPSULE BY MOUTH ONCE DAILY. 100 capsule 2   methocarbamol  (ROBAXIN ) 500 MG tablet Take 1 tablet (500 mg total) by mouth every 6 (six) hours as needed for muscle spasms. 120 tablet 0   omega-3 acid ethyl esters (LOVAZA) 1 g capsule Take 1,400 mg by mouth 2 (two) times daily.     rosuvastatin  (CRESTOR ) 5 MG tablet Take 1 tablet (5 mg total) by mouth daily. 100 tablet 1   Vitamin E 400 units TABS Take 400 Units by mouth daily.     No current facility-administered  medications on file prior to visit.   No Known Allergies Social History   Socioeconomic History   Marital status: Married    Spouse name: Josefina Nian   Number of children: 2   Years of education: Not on file   Highest education level: 12th grade  Occupational History   Not on file  Tobacco Use   Smoking status: Never   Smokeless tobacco: Never  Vaping Use   Vaping status: Never Used  Substance and Sexual Activity   Alcohol use: No   Drug use: No   Sexual activity: Yes    Birth control/protection: Surgical  Other Topics Concern   Not on file  Social History Narrative   ** Merged History Encounter **    2 sons   3 grand children    Social Drivers of Corporate investment banker Strain: Low Risk  (07/28/2023)   Overall Financial Resource Strain (CARDIA)    Difficulty of Paying Living Expenses: Not hard at all  Food Insecurity: No Food Insecurity (07/28/2023)   Hunger Vital Sign    Worried About Running Out of Food in the Last Year: Never true    Ran Out of Food in the Last Year: Never true  Transportation Needs: No Transportation Needs (07/28/2023)   PRAPARE - Transportation    Lack of Transportation (  Medical): No    Lack of Transportation (Non-Medical): No  Physical Activity: Insufficiently Active (07/28/2023)   Exercise Vital Sign    Days of Exercise per Week: 4 days    Minutes of Exercise per Session: 30 min  Stress: No Stress Concern Present (07/28/2023)   [employer] of Occupational Health - Occupational Stress Questionnaire    Feeling of Stress : Only a little  Social Connections: Socially Integrated (07/28/2023)   Social Connection and Isolation Panel [NHANES]    Frequency of Communication with Friends and Family: More than three times a week    Frequency of Social Gatherings with Friends and Family: More than three times a week    Attends Religious Services: More than 4 times per year    Active Member of [employer] or Organizations: Yes    Attends Tax inspector Meetings: More than 4 times per year    Marital Status: Married  Catering manager Violence: Not At Risk (09/21/2022)   Humiliation, Afraid, Rape, and Kick questionnaire    Fear of Current or Ex-Partner: No    Emotionally Abused: No    Physically Abused: No    Sexually Abused: No    Review of Systems  Genitourinary:  Positive for dysuria.  Musculoskeletal:  Positive for back pain.       Objective:   Physical Exam Vitals reviewed.  Constitutional:      Appearance: She is normal weight.  Cardiovascular:     Rate and Rhythm: Normal rate and regular rhythm.     Pulses: Normal pulses.     Heart sounds: Normal heart sounds. No murmur heard.    No friction rub. No gallop.  Pulmonary:     Effort: Pulmonary effort is normal. No respiratory distress.     Breath sounds: Normal breath sounds. No wheezing, rhonchi or rales.  Musculoskeletal:     Right lower leg: No edema.     Left lower leg: No edema.  Neurological:     Mental Status: She is alert.           Assessment & Plan:  Dysuria - Plan: Urinalysis, Routine w reflex microscopic, Urine Culture  Urinalysis is unremarkable.  I recommended pushing fluids and monitoring her symptoms for the next 24 hours.  If the dysuria worsens, start Bactrim  double strength tablets twice daily for 3 days

## 2023-11-25 LAB — URINE CULTURE
MICRO NUMBER:: 16348185
Result:: NO GROWTH
SPECIMEN QUALITY:: ADEQUATE

## 2023-12-04 ENCOUNTER — Encounter: Payer: Self-pay | Admitting: Family Medicine

## 2023-12-05 DIAGNOSIS — H2513 Age-related nuclear cataract, bilateral: Secondary | ICD-10-CM | POA: Diagnosis not present

## 2023-12-05 DIAGNOSIS — H524 Presbyopia: Secondary | ICD-10-CM | POA: Diagnosis not present

## 2023-12-05 DIAGNOSIS — H52223 Regular astigmatism, bilateral: Secondary | ICD-10-CM | POA: Diagnosis not present

## 2023-12-05 DIAGNOSIS — H43813 Vitreous degeneration, bilateral: Secondary | ICD-10-CM | POA: Diagnosis not present

## 2023-12-05 DIAGNOSIS — H5203 Hypermetropia, bilateral: Secondary | ICD-10-CM | POA: Diagnosis not present

## 2023-12-08 ENCOUNTER — Ambulatory Visit

## 2023-12-08 ENCOUNTER — Ambulatory Visit: Admitting: Orthopedic Surgery

## 2023-12-14 ENCOUNTER — Ambulatory Visit: Payer: Self-pay | Attending: Cardiology | Admitting: Cardiology

## 2023-12-14 ENCOUNTER — Encounter: Payer: Self-pay | Admitting: Cardiology

## 2023-12-14 VITALS — BP 136/80 | HR 75 | Ht 67.5 in | Wt 177.6 lb

## 2023-12-14 DIAGNOSIS — E782 Mixed hyperlipidemia: Secondary | ICD-10-CM | POA: Diagnosis not present

## 2023-12-14 DIAGNOSIS — I480 Paroxysmal atrial fibrillation: Secondary | ICD-10-CM | POA: Diagnosis not present

## 2023-12-14 NOTE — Progress Notes (Signed)
    Cardiology Office Note  Date: 12/14/2023   ID: Sherrylynn, Pach 05-Oct-1950, MRN [id number]  History of Present Illness: Tara Brennan is a 73 y.o. female last seen in February 2024.  She is here for a routine visit.  Overall doing well, she does not report any sense of palpitations, no dyspnea beyond NYHA class II, no exertional chest pain.  She takes care of of her grandson who will turn 59 years old in June, she walks outside for at least 30 minutes a day.  She has also been trying to lose some weight through nutritional consultation.  We went over her medications.  She does not report any spontaneous bleeding problems on Eliquis .  I reviewed her lab work.  I reviewed her ECG today which shows sinus rhythm with PAC.  Physical Exam: VS:  BP 136/80   Pulse 75   Ht 5' 7.5" (1.715 m)   Wt 177 lb 9.6 oz (80.6 kg)   SpO2 97%   BMI 27.41 kg/m , BMI Body mass index is 27.41 kg/m.  Wt Readings from Last 3 Encounters:  12/14/23 177 lb 9.6 oz (80.6 kg)  11/24/23 183 lb (83 kg)  07/28/23 173 lb 6 oz (78.6 kg)    General: Patient appears comfortable at rest. HEENT: Conjunctiva and lids normal. Neck: Supple, no elevated JVP or carotid bruits. Lungs: Clear to auscultation, nonlabored breathing at rest. Cardiac: Regular rate and rhythm, no S3 or significant systolic murmur, no pericardial rub. Extremities: No pitting edema.  ECG:  An ECG dated 09/30/2022 was personally reviewed today and demonstrated:  Sinus rhythm.  Labwork: 05/02/2023: ALT 13; AST 17; BUN 18; Creat 0.75; Hemoglobin 12.6; Platelets 314; Potassium 4.7; Sodium 142     Component Value Date/Time   CHOL 162 05/02/2023 0821   TRIG 83 05/02/2023 0821   HDL 51 05/02/2023 0821   CHOLHDL 3.2 05/02/2023 0821   VLDL 11 10/24/2022 0934   LDLCALC 94 05/02/2023 0821   Other Studies Reviewed Today:  No interval cardiac testing for review today.  Assessment and Plan:  1.  Paroxysmal atrial fibrillation.   CHA2DS2-VASc score is 3.  Symptomatically stable with no interval palpitations and in sinus rhythm today.  Continue Eliquis  5 mg twice daily for stroke prophylaxis.  She is also on Cardizem  CD 180 mg daily.  2.  Mixed hyperlipidemia.  LDL 94 in September 2024.  She continues on Crestor  5 mg daily.  Disposition:  Follow up 1 year.  Signed, Gerard Knight, M.D., F.A.C.C. [facility]

## 2023-12-14 NOTE — Patient Instructions (Signed)
 Medication Instructions:  Your physician recommends that you continue on your current medications as directed. Please refer to the Current Medication list given to you today.   Labwork: None today  Testing/Procedures: None today  Follow-Up: 1 YEAR  Any Other Special Instructions Will Be Listed Below (If Applicable).  If you need a refill on your cardiac medications before your next appointment, please call your pharmacy.

## 2023-12-15 ENCOUNTER — Ambulatory Visit: Admitting: Orthopedic Surgery

## 2023-12-15 ENCOUNTER — Other Ambulatory Visit (INDEPENDENT_AMBULATORY_CARE_PROVIDER_SITE_OTHER): Payer: Self-pay

## 2023-12-15 ENCOUNTER — Encounter: Payer: Self-pay | Admitting: Orthopedic Surgery

## 2023-12-15 DIAGNOSIS — M25561 Pain in right knee: Secondary | ICD-10-CM

## 2023-12-15 DIAGNOSIS — M25562 Pain in left knee: Secondary | ICD-10-CM

## 2023-12-15 NOTE — Progress Notes (Unsigned)
 Office Visit Note   Patient: Tara Brennan           Date of Birth: March 01, 1951           MRN: [id number] Visit Date: 12/15/2023 Requested by: Austine Lefort, MD [address] [place],  [place] [address] PCP: Austine Lefort, MD  Subjective: Chief Complaint  Patient presents with   Other    Bil knee pain    HPI: Tara Brennan is a 73 y.o. female who presents to the office reporting lateral knee soreness.  She feels like she may have "fluid in my knee".  Denies any weakness locking popping.  She does report some stiffness after sitting more than 1 hour.  No prior knee surgery.  Takes Tylenol  PM with relief.  No medications.  Outside of Tylenol .  She did have vein surgery several years ago..                ROS: All systems reviewed are negative as they relate to the chief complaint within the history of present illness.  Patient denies fevers or chills.  Assessment & Plan: Visit Diagnoses:  1. Pain in both knees, unspecified chronicity     Plan: Impression is mild radiographic arthritis with excellent range of motion and no effusion in either knee.  Plan is continuation of nonweightbearing quadrant and the exercises.  I think if her symptoms worsen we could consider an injections.  No indication for any type of surgical intervention at this time.  Follow-up as needed.  Follow-Up Instructions: No follow-ups on file.   Orders:  Orders Placed This Encounter  Procedures   XR KNEE 3 VIEW RIGHT   XR KNEE 3 VIEW LEFT   No orders of the defined types were placed in this encounter.     Procedures: No procedures performed   Clinical Data: No additional findings.  Objective: Vital Signs: There were no vitals taken for this visit.  Physical Exam:  Constitutional: Patient appears well-developed HEENT:  Head: Normocephalic Eyes:EOM are normal Neck: Normal range of motion Cardiovascular: Normal rate Pulmonary/chest: Effort normal Neurologic: Patient is  alert Skin: Skin is warm Psychiatric: Patient has normal mood and affect  Ortho Exam: Ortho exam demonstrates full active and passive range of motion of both knees.  Pedal pulses intact.  Ankle dorsiflexion intact.  Collateral cruciate ligaments are stable.  No focal medial or lateral joint line tenderness and minimal patellofemoral crepitus bilaterally.  No groin pain with internal or external rotation of either leg.  Specialty Comments:  No specialty comments available.  Imaging: No results found.   PMFS History: Patient Active Problem List   Diagnosis Date Noted   Chest pain due to myocardial ischemia 09/21/2022   Family hx colonic polyps 09/21/2022   Left lower quadrant pain 09/21/2022   Diverticulitis 11/30/2020   Abnormal findings on diagnostic imaging of other parts of digestive tract 11/30/2020   Dysphagia 11/30/2020   Esophageal reflux 11/30/2020   Atrial fibrillation with RVR (HCC) 06/23/2019   Pneumothorax, right 11/30/2017   Osteoporosis 10/05/2015   Pain in toe 04/04/2013   Patellofemoral arthritis 12/07/2011   OA (osteoarthritis) of knee 12/07/2011   Knee pain 12/07/2011   KNEE, ARTHRITIS, DEGEN./OSTEO 09/14/2009   Past Medical History:  Diagnosis Date   Anxiety    GERD (gastroesophageal reflux disease)    Osteopenia    Ovarian cyst    Paroxysmal atrial fibrillation [facility])    Diagnosed November 2020  Family History  Problem Relation Age of Onset   Hypertension Mother    Cancer Father    Diabetes Sister    Arthritis Other     Past Surgical History:  Procedure Laterality Date   ABDOMINAL HYSTERECTOMY  1984   ESOPHAGOGASTRODUODENOSCOPY  06/28/2012   Procedure: ESOPHAGOGASTRODUODENOSCOPY (EGD);  Surgeon: Mathew Solomon, MD;  Location: [facility] ENDOSCOPY;  Service: Endoscopy;  Laterality: N/A;   OVARIAN CYST SURGERY     PARTIAL HYSTERECTOMY     Social History   Occupational History   Not on file  Tobacco Use   Smoking status: Never   Smokeless tobacco:  Never  Vaping Use   Vaping status: Never Used  Substance and Sexual Activity   Alcohol use: No   Drug use: No   Sexual activity: Yes    Birth control/protection: Surgical

## 2023-12-18 DIAGNOSIS — L905 Scar conditions and fibrosis of skin: Secondary | ICD-10-CM | POA: Diagnosis not present

## 2024-01-16 ENCOUNTER — Ambulatory Visit: Payer: PPO | Admitting: Nutrition

## 2024-01-31 ENCOUNTER — Other Ambulatory Visit (HOSPITAL_COMMUNITY): Payer: Self-pay | Admitting: Obstetrics & Gynecology

## 2024-01-31 DIAGNOSIS — Z1231 Encounter for screening mammogram for malignant neoplasm of breast: Secondary | ICD-10-CM

## 2024-02-08 ENCOUNTER — Encounter (HOSPITAL_COMMUNITY): Payer: Self-pay

## 2024-02-08 ENCOUNTER — Ambulatory Visit (HOSPITAL_COMMUNITY)
Admission: RE | Admit: 2024-02-08 | Discharge: 2024-02-08 | Disposition: A | Discharge status: Home / Self Care | Source: Ambulatory Visit | Attending: Obstetrics & Gynecology | Admitting: Obstetrics & Gynecology

## 2024-02-08 DIAGNOSIS — Z1231 Encounter for screening mammogram for malignant neoplasm of breast: Secondary | ICD-10-CM | POA: Insufficient documentation

## 2024-02-19 ENCOUNTER — Other Ambulatory Visit: Payer: Self-pay

## 2024-02-19 ENCOUNTER — Telehealth: Payer: Self-pay

## 2024-02-19 DIAGNOSIS — M810 Age-related osteoporosis without current pathological fracture: Secondary | ICD-10-CM

## 2024-02-19 NOTE — Telephone Encounter (Signed)
 Copied from CRM [id number]. Topic: Clinical - Request for Lab/Test Order >> Feb 19, 2024  8:15 AM Myrick T wrote: Reason for CRM: patient called to get an order for her bone density test. Please f/u with patient once orders have been placed

## 2024-02-19 NOTE — Telephone Encounter (Signed)
 Sent order to pcp to sign.

## 2024-03-07 ENCOUNTER — Ambulatory Visit (HOSPITAL_COMMUNITY)
Admission: RE | Admit: 2024-03-07 | Discharge: 2024-03-07 | Disposition: A | Discharge status: Home / Self Care | Source: Ambulatory Visit | Attending: Family Medicine | Admitting: Family Medicine

## 2024-03-07 ENCOUNTER — Other Ambulatory Visit (HOSPITAL_COMMUNITY)

## 2024-03-07 DIAGNOSIS — M810 Age-related osteoporosis without current pathological fracture: Secondary | ICD-10-CM | POA: Diagnosis not present

## 2024-03-07 DIAGNOSIS — Z780 Asymptomatic menopausal state: Secondary | ICD-10-CM | POA: Diagnosis not present

## 2024-03-08 ENCOUNTER — Other Ambulatory Visit: Payer: Self-pay

## 2024-03-08 ENCOUNTER — Other Ambulatory Visit (HOSPITAL_COMMUNITY): Payer: Self-pay

## 2024-03-08 ENCOUNTER — Ambulatory Visit: Payer: Self-pay | Admitting: Family Medicine

## 2024-03-08 DIAGNOSIS — M810 Age-related osteoporosis without current pathological fracture: Secondary | ICD-10-CM

## 2024-03-08 MED ORDER — ALENDRONATE SODIUM 70 MG PO TABS: 70.0000 mg | ORAL_TABLET | ORAL | 11 refills | Status: DC

## 2024-03-12 ENCOUNTER — Ambulatory Visit: Admitting: Family Medicine

## 2024-03-12 ENCOUNTER — Encounter: Payer: Self-pay | Admitting: Family Medicine

## 2024-03-12 VITALS — BP 132/70 | HR 63 | Temp 98.1°F | Ht 67.5 in | Wt 176.0 lb

## 2024-03-12 DIAGNOSIS — M810 Age-related osteoporosis without current pathological fracture: Secondary | ICD-10-CM

## 2024-03-12 DIAGNOSIS — E7800 Pure hypercholesterolemia, unspecified: Secondary | ICD-10-CM

## 2024-03-12 DIAGNOSIS — R198 Other specified symptoms and signs involving the digestive system and abdomen: Secondary | ICD-10-CM | POA: Insufficient documentation

## 2024-03-12 NOTE — Progress Notes (Signed)
 Subjective:    Patient ID: Tara Brennan, female    DOB: 1951/06/04, 73 y.o.   MRN: [id number]  Patient has a history of osteoporosis.  On a bone density in 2023, her T-score was -2.6 in the left femur neck.  She has been on Fosamax  for more than 5 years.  She discontinued this about 5 months ago.  She had a repeat bone density performed this summer which showed a T-score that was stable in the left femur neck at -2.6.  However the right femur neck, the T-score hip following the -2.8.  She is here today to discuss treatment options.  She is also overdue for fasting lab work.  Her blood pressure is well-controlled today at 132/70. Past Medical History:  Diagnosis Date   Anxiety    GERD (gastroesophageal reflux disease)    Osteopenia    Ovarian cyst    Paroxysmal atrial fibrillation [facility])    Diagnosed November 2020     Past Surgical History:  Procedure Laterality Date   ABDOMINAL HYSTERECTOMY  1984   ESOPHAGOGASTRODUODENOSCOPY  06/28/2012   Procedure: ESOPHAGOGASTRODUODENOSCOPY (EGD);  Surgeon: Oliva FORBES Boots, MD;  Location: [facility] ENDOSCOPY;  Service: Endoscopy;  Laterality: N/A;   OVARIAN CYST SURGERY     PARTIAL HYSTERECTOMY     Current Outpatient Medications on File Prior to Visit  Medication Sig Dispense Refill   acetaminophen  (TYLENOL ) 500 MG tablet Take 500 mg by mouth every 6 (six) hours as needed for mild pain or moderate pain.     apixaban  (ELIQUIS ) 5 MG TABS tablet Take 1 tablet (5 mg total) by mouth 2 (two) times daily. 200 tablet 1   calcium  carbonate (OS-CAL) 1250 (500 Ca) MG chewable tablet Chew 1 tablet by mouth daily.     cholecalciferol  (VITAMIN D3) 25 MCG (1000 UNIT) tablet Take 1,000 Units by mouth daily.     diltiazem  (CARDIZEM  CD) 180 MG 24 hr capsule TAKE (1) CAPSULE BY MOUTH ONCE DAILY. 100 capsule 2   omega-3 acid ethyl esters (LOVAZA) 1 g capsule Take 1,400 mg by mouth 2 (two) times daily.     rosuvastatin  (CRESTOR ) 5 MG tablet Take 1 tablet (5 mg total) by  mouth daily. 100 tablet 1   Vitamin E 400 units TABS Take 400 Units by mouth daily.     methocarbamol  (ROBAXIN ) 500 MG tablet Take 1 tablet (500 mg total) by mouth every 6 (six) hours as needed for muscle spasms. (Patient not taking: Reported on 03/12/2024) 120 tablet 0   sulfamethoxazole -trimethoprim  (BACTRIM  DS) 800-160 MG tablet Take 1 tablet by mouth 2 (two) times daily. (Patient not taking: Reported on 03/12/2024) 10 tablet 0   No current facility-administered medications on file prior to visit.   No Known Allergies Social History   Socioeconomic History   Marital status: Married    Spouse name: Christopher   Number of children: 2   Years of education: Not on file   Highest education level: 12th grade  Occupational History   Not on file  Tobacco Use   Smoking status: Never   Smokeless tobacco: Never  Vaping Use   Vaping status: Never Used  Substance and Sexual Activity   Alcohol use: No   Drug use: No   Sexual activity: Yes    Birth control/protection: Surgical  Other Topics Concern   Not on file  Social History Narrative   ** Merged History Encounter **    2 sons   3 grand children  Social Drivers of Corporate investment banker Strain: Low Risk  (03/11/2024)   Overall Financial Resource Strain (CARDIA)    Difficulty of Paying Living Expenses: Not hard at all  Food Insecurity: No Food Insecurity (03/11/2024)   Hunger Vital Sign    Worried About Running Out of Food in the Last Year: Never true    Ran Out of Food in the Last Year: Never true  Transportation Needs: No Transportation Needs (03/11/2024)   PRAPARE - Administrator, Civil Service (Medical): No    Lack of Transportation (Non-Medical): No  Physical Activity: Insufficiently Active (03/11/2024)   Exercise Vital Sign    Days of Exercise per Week: 4 days    Minutes of Exercise per Session: 30 min  Stress: No Stress Concern Present (03/11/2024)   [employer] of Occupational Health - Occupational Stress  Questionnaire    Feeling of Stress: Only a little  Social Connections: Socially Integrated (03/11/2024)   Social Connection and Isolation Panel    Frequency of Communication with Friends and Family: More than three times a week    Frequency of Social Gatherings with Friends and Family: More than three times a week    Attends Religious Services: More than 4 times per year    Active Member of [employer] or Organizations: Yes    Attends Banker Meetings: More than 4 times per year    Marital Status: Married  Catering manager Violence: Not At Risk (09/21/2022)   Humiliation, Afraid, Rape, and Kick questionnaire    Fear of Current or Ex-Partner: No    Emotionally Abused: No    Physically Abused: No    Sexually Abused: No    Review of Systems  Genitourinary:  Positive for dysuria.  Musculoskeletal:  Positive for back pain.       Objective:   Physical Exam Vitals reviewed.  Constitutional:      Appearance: She is normal weight.  Cardiovascular:     Rate and Rhythm: Normal rate and regular rhythm.     Pulses: Normal pulses.     Heart sounds: Normal heart sounds. No murmur heard.    No friction rub. No gallop.  Pulmonary:     Effort: Pulmonary effort is normal. No respiratory distress.     Breath sounds: Normal breath sounds. No wheezing, rhonchi or rales.  Musculoskeletal:     Right lower leg: No edema.     Left lower leg: No edema.  Neurological:     Mental Status: She is alert.           Assessment & Plan:  Pure hypercholesterolemia - Plan: CBC with Differential/Platelet, Lipid panel, Comprehensive metabolic panel with GFR  Osteoporosis, unspecified osteoporosis type, unspecified pathological fracture presence  Patient has osteoporosis.  However the T-score is stable in the left femur.  She has completed 5 years of treatment with Fosamax .  Therefore we have elected to monitor for the next 2 years and repeat a bone density test in 2 years.  If worsening at that  point, we may start Prolia or consider Evenity if T-score has fallen to -3 or worse.  While the patient is here today I will check a CBC a CMP and a lipid panel

## 2024-03-13 LAB — CBC WITH DIFFERENTIAL/PLATELET
Absolute Lymphocytes: 1643 {cells}/uL (ref 850–3900)
Absolute Monocytes: 502 {cells}/uL (ref 200–950)
Basophils Absolute: 31 {cells}/uL (ref 0–200)
Basophils Relative: 0.5 %
Eosinophils Absolute: 118 {cells}/uL (ref 15–500)
Eosinophils Relative: 1.9 %
HCT: 39 % (ref 35.0–45.0)
Hemoglobin: 12.7 g/dL (ref 11.7–15.5)
MCH: 28.7 pg (ref 27.0–33.0)
MCHC: 32.6 g/dL (ref 32.0–36.0)
MCV: 88 fL (ref 80.0–100.0)
MPV: 11 fL (ref 7.5–12.5)
Monocytes Relative: 8.1 %
Neutro Abs: 3906 {cells}/uL (ref 1500–7800)
Neutrophils Relative %: 63 %
Platelets: 284 Thousand/uL (ref 140–400)
RBC: 4.43 Million/uL (ref 3.80–5.10)
RDW: 12.5 % (ref 11.0–15.0)
Total Lymphocyte: 26.5 %
WBC: 6.2 Thousand/uL (ref 3.8–10.8)

## 2024-03-13 LAB — LIPID PANEL
Cholesterol: 176 mg/dL (ref <200)
HDL: 60 mg/dL (ref >=50)
LDL Cholesterol (Calc): 95 mg/dL
Non-HDL Cholesterol (Calc): 116 mg/dL (ref <130)
Total CHOL/HDL Ratio: 2.9 (calc) (ref <5.0)
Triglycerides: 112 mg/dL (ref <150)

## 2024-03-13 LAB — COMPREHENSIVE METABOLIC PANEL WITH GFR
AG Ratio: 2 (calc) (ref 1.0–2.5)
ALT: 17 U/L (ref 6–29)
AST: 20 U/L (ref 10–35)
Albumin: 4.5 g/dL (ref 3.6–5.1)
Alkaline phosphatase (APISO): 62 U/L (ref 37–153)
BUN/Creatinine Ratio: 36 (calc) — ABNORMAL HIGH (ref 6–22)
BUN: 27 mg/dL — ABNORMAL HIGH (ref 7–25)
CO2: 29 mmol/L (ref 20–32)
Calcium: 10.2 mg/dL (ref 8.6–10.4)
Chloride: 104 mmol/L (ref 98–110)
Creat: 0.75 mg/dL (ref 0.60–1.00)
Globulin: 2.2 g/dL (ref 1.9–3.7)
Glucose, Bld: 98 mg/dL (ref 65–99)
Potassium: 5.4 mmol/L — ABNORMAL HIGH (ref 3.5–5.3)
Sodium: 143 mmol/L (ref 135–146)
Total Bilirubin: 0.7 mg/dL (ref 0.2–1.2)
Total Protein: 6.7 g/dL (ref 6.1–8.1)
eGFR: 85 mL/min/1.73m2 (ref >=60)

## 2024-03-14 ENCOUNTER — Ambulatory Visit: Payer: Self-pay | Admitting: Family Medicine

## 2024-03-16 ENCOUNTER — Other Ambulatory Visit: Payer: Self-pay | Admitting: Cardiology

## 2024-03-18 NOTE — Telephone Encounter (Signed)
 Prescription refill request for Eliquis  received. Indication: PAF Last office visit: 12/14/23  GORMAN Sierras MD Scr: 0.75 on 03/12/24  Epic Age: 73 Weight: 80.6kg  Based on above findings Eliquis  5mg  twice daily is the appropriate dose.  Refill approved.

## 2024-03-20 ENCOUNTER — Encounter: Payer: Self-pay | Admitting: Family Medicine

## 2024-04-23 ENCOUNTER — Telehealth: Payer: Self-pay

## 2024-04-23 DIAGNOSIS — Z86018 Personal history of other benign neoplasm: Secondary | ICD-10-CM | POA: Diagnosis not present

## 2024-04-23 DIAGNOSIS — I4891 Unspecified atrial fibrillation: Secondary | ICD-10-CM | POA: Diagnosis not present

## 2024-04-23 DIAGNOSIS — Z7901 Long term (current) use of anticoagulants: Secondary | ICD-10-CM | POA: Diagnosis not present

## 2024-04-23 NOTE — Telephone Encounter (Signed)
 Copied from CRM [id number]. Topic: General - Other >> Apr 23, 2024 11:58 AM Rea ORN wrote: Reason for CRM: pt called to advise that she was charged $5 for her last office visit. When she called insurance they told her that it was because the office visit was coded as mental health. Pt stated she was not seen for mental health and has never been seen for that at this office. Please call back to advise how appt was coded and how this can be fixed.

## 2024-04-23 NOTE — Telephone Encounter (Signed)
   Pre-operative Risk Assessment    Patient Name: Tara Brennan  DOB: 1951/04/02 MRN: [id number]   Date of last office visit: 12/14/23 Date of next office visit: Not scheduled   Request for Surgical Clearance    Procedure:  Colonoscopy  Date of Surgery:  Clearance 05/21/24                                Surgeon:  Dr. Oliva Boots Surgeon's Group or Practice Name:  [facility] Phone number:  [phone] Fax number:  [phone]   Type of Clearance Requested:   - Medical  - Pharmacy:  Hold Apixaban  (Eliquis )     Type of Anesthesia:  Propofol   Additional requests/questions:    [name]   04/23/2024, 5:30 PM

## 2024-04-26 ENCOUNTER — Telehealth: Payer: Self-pay

## 2024-04-26 NOTE — Telephone Encounter (Signed)
 Called patient left voicemail 04/26/24

## 2024-04-26 NOTE — Telephone Encounter (Signed)
   Name: ROSHANDA BALAZS  DOB: 11-Feb-1951  MRN: [id number]  Primary Cardiologist: Jayson Sierras, MD  Chart reviewed as part of pre-operative protocol coverage. Because of Trameka Dorough Teehan's past medical history and time since last visit, she will require a follow-up telephone visit in order to better assess preoperative cardiovascular risk.  Pre-op covering staff: - Please schedule appointment and call patient to inform them. If patient already had an upcoming appointment within acceptable timeframe, please add pre-op clearance to the appointment notes so provider is aware. - Please contact requesting surgeon's office via preferred method (i.e, phone, fax) to inform them of need for appointment prior to surgery.   Per office protocol, patient can hold Eliquis  for 2 days prior to procedure.  She can restart when medically safe to do so. Patient will not need bridging with Lovenox  (enoxaparin ) around procedure.  She does live in [place] .   Orren LOISE Fabry, PA-C  04/26/2024, 10:37 AM

## 2024-04-26 NOTE — Telephone Encounter (Signed)
 Pt returning call. Please advise.

## 2024-04-26 NOTE — Telephone Encounter (Signed)
 Patient with diagnosis of atrial fibrillation on Eliquis  for anticoagulation.    Procedure:  Colonoscopy   Date of Surgery: 05/21/24                                 CHA2DS2-VASc Score = 2   This indicates a 2.2% annual risk of stroke. The patient's score is based upon: CHF History: 0 HTN History: 0 Diabetes History: 0 Stroke History: 0 Vascular Disease History: 0 Age Score: 1 Gender Score: 1     CrCl 74 ml/min Platelet count 284 K  Patient has not had an Afib/aflutter ablation within the last 3 months or DCCV within the last 30 days  Per office protocol, patient can hold Eliquis  for 2 days prior to procedure.   Patient will not need bridging with Lovenox  (enoxaparin ) around procedure.  **This guidance is not considered finalized until pre-operative APP has relayed final recommendations.**

## 2024-04-30 NOTE — Telephone Encounter (Signed)
 Called patient to contact our office to schedule cardiac clearance telehealth visit. NA, left message on VM.

## 2024-05-02 ENCOUNTER — Telehealth: Payer: Self-pay

## 2024-05-02 NOTE — Telephone Encounter (Signed)
 Telehealth consent updated

## 2024-05-02 NOTE — Telephone Encounter (Signed)
  Patient Consent for Virtual Visit        Tara Brennan has provided verbal consent on 05/02/2024 for a virtual visit (video or telephone). Appoint scheduled 05/13/24 @9 :40   CONSENT FOR VIRTUAL VISIT FOR:  Tara Brennan  By participating in this virtual visit I agree to the following:  I hereby voluntarily request, consent and authorize [facility] and its employed or contracted physicians, physician assistants, nurse practitioners or other licensed health care professionals (the Practitioner), to provide me with telemedicine health care services (the "Services) as deemed necessary by the treating Practitioner. I acknowledge and consent to receive the Services by the Practitioner via telemedicine. I understand that the telemedicine visit will involve communicating with the Practitioner through live audiovisual communication technology and the disclosure of certain medical information by electronic transmission. I acknowledge that I have been given the opportunity to request an in-person assessment or other available alternative prior to the telemedicine visit and am voluntarily participating in the telemedicine visit.  I understand that I have the right to withhold or withdraw my consent to the use of telemedicine in the course of my care at any time, without affecting my right to future care or treatment, and that the Practitioner or I may terminate the telemedicine visit at any time. I understand that I have the right to inspect all information obtained and/or recorded in the course of the telemedicine visit and may receive copies of available information for a reasonable fee.  I understand that some of the potential risks of receiving the Services via telemedicine include:  Delay or interruption in medical evaluation due to technological equipment failure or disruption; Information transmitted may not be sufficient (e.g. poor resolution of images) to allow for appropriate  medical decision making by the Practitioner; and/or  In rare instances, security protocols could fail, causing a breach of personal health information.  Furthermore, I acknowledge that it is my responsibility to provide information about my medical history, conditions and care that is complete and accurate to the best of my ability. I acknowledge that Practitioner's advice, recommendations, and/or decision may be based on factors not within their control, such as incomplete or inaccurate data provided by me or distortions of diagnostic images or specimens that may result from electronic transmissions. I understand that the practice of medicine is not an exact science and that Practitioner makes no warranties or guarantees regarding treatment outcomes. I acknowledge that a copy of this consent can be made available to me via my patient portal [facility] MyChart), or I can request a printed copy by calling the office of [facility].    I understand that my insurance will be billed for this visit.   I have read or had this consent read to me. I understand the contents of this consent, which adequately explains the benefits and risks of the Services being provided via telemedicine.  I have been provided ample opportunity to ask questions regarding this consent and the Services and have had my questions answered to my satisfaction. I give my informed consent for the services to be provided through the use of telemedicine in my medical care

## 2024-05-02 NOTE — Telephone Encounter (Signed)
 Appointment scheduled for 05/13/2024 @ 9:40am. Med req and consent are complete. Call patient on cell [phone].

## 2024-05-13 ENCOUNTER — Ambulatory Visit: Attending: Cardiology | Admitting: Student

## 2024-05-13 DIAGNOSIS — Z01810 Encounter for preprocedural cardiovascular examination: Secondary | ICD-10-CM | POA: Diagnosis not present

## 2024-05-13 NOTE — Progress Notes (Signed)
 Virtual Visit via Telephone Note   Because of Tara Brennan's co-morbid illnesses, she is at least at moderate risk for complications without adequate follow up.  This format is felt to be most appropriate for this patient at this time.  The patient did not have access to video technology/had technical difficulties with video requiring transitioning to audio format only (telephone).  All issues noted in this document were discussed and addressed.  No physical exam could be performed with this format.  Please refer to the patient's chart for her consent to telehealth for [facility].  Evaluation Performed:  Preoperative cardiovascular risk assessment _____________   Date:  05/13/2024   Patient ID:  Tara Brennan, Tara Brennan [birth year], MRN [id number] Patient Location:  Home Provider location:   Office  Primary Care Provider:  Duanne Butler DASEN, MD Primary Cardiologist:  Jayson Sierras, MD  Chief Complaint / Patient Profile   73 y.o. y/o female with a h/o PAF on anticoagulation, hyperlipidemia, GERD who is pending colonoscopy by Dr. Rosalie on 05/21/2024 and presents today for telephonic preoperative cardiovascular risk assessment.  History of Present Illness    Tara Brennan is a 73 y.o. female who presents via audio/video conferencing for a telehealth visit today.  Pt was last seen in cardiology clinic on 12/14/2023 by Dr. Sierras.  At that time [name] was stable from a cardiac standpoint.  The patient is now pending procedure as outlined above. Since her last visit, she is doing well. Patient denies shortness of breath, dyspnea on exertion, lower extremity edema, orthopnea or PND. No chest pain, pressure, or tightness. She has occasional palpitations with the last brief episode occurring 2 days ago. No lightheadedness, dizziness, presyncope or syncope. She is very active. She walks for exercise 3 days a week. She also keeps her 3 grandchildren.   Past Medical  History    Past Medical History:  Diagnosis Date   Anxiety    GERD (gastroesophageal reflux disease)    Osteopenia    Ovarian cyst    Paroxysmal atrial fibrillation [facility])    Diagnosed November 2020   Past Surgical History:  Procedure Laterality Date   ABDOMINAL HYSTERECTOMY  1984   ESOPHAGOGASTRODUODENOSCOPY  06/28/2012   Procedure: ESOPHAGOGASTRODUODENOSCOPY (EGD);  Surgeon: Oliva FORBES Rosalie, MD;  Location: [facility] ENDOSCOPY;  Service: Endoscopy;  Laterality: N/A;   OVARIAN CYST SURGERY     PARTIAL HYSTERECTOMY      Allergies  No Known Allergies  Home Medications    Prior to Admission medications   Medication Sig Start Date End Date Taking? Authorizing Provider  acetaminophen  (TYLENOL ) 500 MG tablet Take 500 mg by mouth every 6 (six) hours as needed for mild pain or moderate pain.    [provider]  apixaban  (ELIQUIS ) 5 MG TABS tablet Take 1 tablet by mouth twice daily 03/18/24   Sierras Jayson MATSU, MD  calcium  carbonate (OS-CAL) 1250 (500 Ca) MG chewable tablet Chew 1 tablet by mouth daily.    [provider]  cholecalciferol  (VITAMIN D3) 25 MCG (1000 UNIT) tablet Take 1,000 Units by mouth daily.    [provider]  diltiazem  (CARDIZEM  CD) 180 MG 24 hr capsule TAKE (1) CAPSULE BY MOUTH ONCE DAILY. 08/28/23   Sierras Jayson MATSU, MD  methocarbamol  (ROBAXIN ) 500 MG tablet Take 1 tablet (500 mg total) by mouth every 6 (six) hours as needed for muscle spasms. Patient not taking: Reported on 03/12/2024 07/28/23   Duanne Butler DASEN, MD  omega-3  acid ethyl esters (LOVAZA) 1 g capsule Take 1,400 mg by mouth 2 (two) times daily.    [provider]  rosuvastatin  (CRESTOR ) 5 MG tablet Take 1 tablet (5 mg total) by mouth daily. 11/17/23   Duanne Butler DASEN, MD  sulfamethoxazole -trimethoprim  (BACTRIM  DS) 800-160 MG tablet Take 1 tablet by mouth 2 (two) times daily. Patient not taking: Reported on 03/12/2024 11/24/23   Duanne Butler DASEN, MD  Vitamin E 400 units TABS Take  400 Units by mouth daily.    [provider]    Physical Exam    Vital Signs:  [name] does not have vital signs available for review today.  Given telephonic nature of communication, physical exam is limited. AAOx3. NAD. Normal affect.  Speech and respirations are unlabored.   Assessment & Plan    Primary Cardiologist: Jayson Sierras, MD  Preoperative cardiovascular risk assessment.  Colonoscopy by Dr. Rosalie on 05/21/2024  Chart reviewed as part of pre-operative protocol coverage. According to the RCRI, patient has a 0.4% risk of MACE. Patient reports activity equivalent to >4.0 METS (walks 3 days a week, keeps 3 grandchildren).   Given past medical history and time since last visit, based on ACC/AHA guidelines, KEISA BLOW would be at acceptable risk for the planned procedure without further cardiovascular testing.   Patient was advised that if she develops new symptoms prior to surgery to contact our office to arrange a follow-up appointment.  she verbalized understanding.  Per Pharm D, patient has not had an Afib/aflutter ablation within the last 3 months or DCCV within the last 30 days. Patient may hold Eliquis  for 2 days prior to procedure.  Patient will not need bridging with Lovenox  around procedure.    I will route this recommendation to the requesting party via Epic fax function.  Please call with questions.  Time:   Today, I have spent 5 minutes with the patient with telehealth technology discussing medical history, symptoms, and management plan.     Barnie Hila, NP  05/13/2024, 7:54 AM

## 2024-05-21 DIAGNOSIS — K649 Unspecified hemorrhoids: Secondary | ICD-10-CM | POA: Diagnosis not present

## 2024-05-21 DIAGNOSIS — Z860101 Personal history of adenomatous and serrated colon polyps: Secondary | ICD-10-CM | POA: Diagnosis not present

## 2024-05-21 DIAGNOSIS — Z09 Encounter for follow-up examination after completed treatment for conditions other than malignant neoplasm: Secondary | ICD-10-CM | POA: Diagnosis not present

## 2024-05-21 DIAGNOSIS — Z83719 Family history of colon polyps, unspecified: Secondary | ICD-10-CM | POA: Diagnosis not present

## 2024-05-21 DIAGNOSIS — K5730 Diverticulosis of large intestine without perforation or abscess without bleeding: Secondary | ICD-10-CM | POA: Diagnosis not present

## 2024-05-31 ENCOUNTER — Other Ambulatory Visit: Payer: Self-pay | Admitting: Family Medicine

## 2024-05-31 DIAGNOSIS — E7800 Pure hypercholesterolemia, unspecified: Secondary | ICD-10-CM

## 2024-06-10 ENCOUNTER — Encounter: Payer: Self-pay | Admitting: Radiology

## 2024-06-14 ENCOUNTER — Other Ambulatory Visit: Payer: Self-pay | Admitting: Cardiology

## 2024-07-16 ENCOUNTER — Telehealth: Payer: Self-pay

## 2024-07-16 ENCOUNTER — Other Ambulatory Visit: Payer: Self-pay | Admitting: Family Medicine

## 2024-07-16 MED ORDER — AMOXICILLIN 875 MG PO TABS: 875.0000 mg | ORAL_TABLET | Freq: Two times a day (BID) | ORAL | 0 refills | Status: AC

## 2024-07-16 NOTE — Telephone Encounter (Signed)
 Copied from CRM #[id number]. Topic: General - Other >> Jul 16, 2024  8:39 AM Antony RAMAN wrote: Reason for CRM: requesting a call back from Kamsiyochukwu Buist jane [phone]

## 2024-08-28 ENCOUNTER — Encounter: Payer: Self-pay | Admitting: Orthopedic Surgery

## 2024-08-28 ENCOUNTER — Other Ambulatory Visit: Payer: Self-pay

## 2024-08-28 ENCOUNTER — Ambulatory Visit: Admitting: Orthopedic Surgery

## 2024-08-28 DIAGNOSIS — M7552 Bursitis of left shoulder: Secondary | ICD-10-CM | POA: Diagnosis not present

## 2024-08-28 DIAGNOSIS — M25512 Pain in left shoulder: Secondary | ICD-10-CM

## 2024-08-28 MED ORDER — LIDOCAINE HCL 1 % IJ SOLN
5.0000 mL | INTRAMUSCULAR | Status: AC | PRN
  Administered 2024-08-28: 5 mL

## 2024-08-28 MED ORDER — BUPIVACAINE HCL 0.5 % IJ SOLN
9.0000 mL | INTRAMUSCULAR | Status: AC | PRN
  Administered 2024-08-28: 9 mL via INTRA_ARTICULAR

## 2024-08-28 MED ORDER — TRIAMCINOLONE ACETONIDE 40 MG/ML IJ SUSP
40.0000 mg | INTRAMUSCULAR | Status: AC | PRN
  Administered 2024-08-28: 40 mg via INTRA_ARTICULAR

## 2024-08-28 NOTE — Progress Notes (Signed)
 "  Office Visit Note   Patient: Tara Brennan           Date of Birth: [birth year]           MRN: [id number] Visit Date: 08/28/2024 Requested by: Duanne Butler DASEN, MD [address] [place],  [place] [address] PCP: Duanne Butler DASEN, MD  Subjective: Chief Complaint  Patient presents with   Left Shoulder - Pain    HPI: Tara Brennan is a 74 y.o. female who presents to the office reporting left shoulder pain.  Been going on for several weeks.  Localizing primarily to the deltoid region.  Hurts for her to pick things up overhead.  Radiates from the elbow occasionally down to the wrist.  Uses Tylenol  and Biofreeze.  Does not wake her from sleep.  Denies any popping or grinding.  No numbness and tingling.  Mostly deltoid pain with no significant mechanical symptoms in the joint..                ROS: All systems reviewed are negative as they relate to the chief complaint within the history of present illness.  Patient denies fevers or chills.  Assessment & Plan: Visit Diagnoses:  1. Left shoulder pain, unspecified chronicity     Plan: Impression is left shoulder bursitis with primarily deltoid pain.  Lets try a subacromial injection today.  Did get some pretty good relief of some of that pain during the anesthetic portion of the injection.  If her symptoms recur we would need to get an MRI scan primarily because of the slight weakness she has with external rotation.  However this weakness is not enough to keep her from being able to pick up her 29 year old grandson.  Follow-Up Instructions: No follow-ups on file.   Orders:  Orders Placed This Encounter  Procedures   XR Shoulder Left   No orders of the defined types were placed in this encounter.     Procedures: Large Joint Inj: L subacromial bursa on 08/28/2024 1:30 PM Indications: diagnostic evaluation and pain Details: 18 G 1.5 in needle, posterior approach  Arthrogram: No  Medications: 9 mL bupivacaine  0.5 %; 5 mL  lidocaine  1 %; 40 mg triamcinolone  acetonide 40 MG/ML Outcome: tolerated well, no immediate complications Procedure, treatment alternatives, risks and benefits explained, specific risks discussed. Consent was given by the patient. Immediately prior to procedure a time out was called to verify the correct patient, procedure, equipment, support staff and site/side marked as required. Patient was prepped and draped in the usual sterile fashion.       Clinical Data: No additional findings.  Objective: Vital Signs: There were no vitals taken for this visit.  Physical Exam:  Constitutional: Patient appears well-developed HEENT:  Head: Normocephalic Eyes:EOM are normal Neck: Normal range of motion Cardiovascular: Normal rate Pulmonary/chest: Effort normal Neurologic: Patient is alert Skin: Skin is warm Psychiatric: Patient has normal mood and affect  Ortho Exam: Ortho exam demonstrates range of motion of the right of 80/100 last/170.  Range of motion on the left is 80/100/170.  External rotation strength is 4+ out of 5 on the left 5+ out of 5 on the right.  Subscap strength 5+ out of 5 bilaterally.  Not too much coarse grinding or crepitus present with passive range of motion of the left shoulder at 90 degrees of abduction.  No discrete AC joint tenderness left versus right.  Cervical spine range of motion is full.  No paresthesias in the  right and left upper extremity.  Specialty Comments:  No specialty comments available.  Imaging: XR Shoulder Left Result Date: 08/28/2024 AP outlet axillary lateral radiographs left shoulder reviewed.  No acute fracture.  Shoulder is located.  Acromiohumeral distance is normal.  No significant degenerative changes in the glenohumeral or AC joint.  Visualized lung fields clear.     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   OA (osteoarthritis) of knee 12/07/2011   Knee pain 12/07/2011   KNEE, ARTHRITIS, DEGEN./OSTEO 09/14/2009   Past Medical History:  Diagnosis Date   Anxiety    GERD (gastroesophageal reflux disease)    Osteopenia    Ovarian cyst    Paroxysmal atrial fibrillation [facility])    Diagnosed November 2020    Family History  Problem Relation Age of Onset   Hypertension Mother    Cancer Father    Diabetes Sister    Arthritis Other     Past Surgical History:  Procedure Laterality Date   ABDOMINAL HYSTERECTOMY  1984   ESOPHAGOGASTRODUODENOSCOPY  06/28/2012   Procedure: ESOPHAGOGASTRODUODENOSCOPY (EGD);  Surgeon: Oliva FORBES Boots, MD;  Location: [facility] ENDOSCOPY;  Service: Endoscopy;  Laterality: N/A;   OVARIAN CYST SURGERY     PARTIAL HYSTERECTOMY     Social History   Occupational History   Not on file  Tobacco Use   Smoking status: Never   Smokeless tobacco: Never  Vaping Use   Vaping status: Never Used  Substance and Sexual Activity   Alcohol use: No   Drug use: No   Sexual activity: Yes    Birth control/protection: Surgical        "
